# Patient Record
Sex: Male | Born: 1955 | Race: White | Hispanic: No | Smoking: Former smoker
Health system: Southern US, Community
[De-identification: ages and names within clinical notes are randomized; demographics above are authoritative.]

## PROBLEM LIST (undated history)

## (undated) DIAGNOSIS — J449 Chronic obstructive pulmonary disease, unspecified: Secondary | ICD-10-CM

## (undated) DIAGNOSIS — K922 Gastrointestinal hemorrhage, unspecified: Secondary | ICD-10-CM

## (undated) DIAGNOSIS — B1920 Unspecified viral hepatitis C without hepatic coma: Secondary | ICD-10-CM

## (undated) DIAGNOSIS — J189 Pneumonia, unspecified organism: Secondary | ICD-10-CM

## (undated) DIAGNOSIS — F1010 Alcohol abuse, uncomplicated: Secondary | ICD-10-CM

## (undated) DIAGNOSIS — C801 Malignant (primary) neoplasm, unspecified: Secondary | ICD-10-CM

## (undated) DIAGNOSIS — M1990 Unspecified osteoarthritis, unspecified site: Secondary | ICD-10-CM

## (undated) DIAGNOSIS — F1410 Cocaine abuse, uncomplicated: Secondary | ICD-10-CM

## (undated) HISTORY — PX: CARDIAC CATHETERIZATION: SHX172

---

## 2005-08-20 ENCOUNTER — Emergency Department: Payer: Self-pay | Admitting: Emergency Medicine

## 2006-11-12 ENCOUNTER — Ambulatory Visit: Payer: Self-pay | Admitting: Internal Medicine

## 2008-05-02 ENCOUNTER — Ambulatory Visit: Payer: Self-pay | Admitting: Gastroenterology

## 2008-07-26 ENCOUNTER — Emergency Department: Payer: Self-pay | Admitting: Internal Medicine

## 2015-03-12 ENCOUNTER — Emergency Department: Payer: Managed Care, Other (non HMO)

## 2015-03-12 ENCOUNTER — Encounter: Payer: Self-pay | Admitting: Emergency Medicine

## 2015-03-12 ENCOUNTER — Emergency Department
Admission: EM | Admit: 2015-03-12 | Discharge: 2015-03-12 | Disposition: A | Discharge status: Home / Self Care | Payer: Managed Care, Other (non HMO) | Attending: Emergency Medicine | Admitting: Emergency Medicine

## 2015-03-12 DIAGNOSIS — S4991XA Unspecified injury of right shoulder and upper arm, initial encounter: Secondary | ICD-10-CM | POA: Diagnosis present

## 2015-03-12 DIAGNOSIS — Y9289 Other specified places as the place of occurrence of the external cause: Secondary | ICD-10-CM | POA: Insufficient documentation

## 2015-03-12 DIAGNOSIS — S29001A Unspecified injury of muscle and tendon of front wall of thorax, initial encounter: Secondary | ICD-10-CM | POA: Insufficient documentation

## 2015-03-12 DIAGNOSIS — Y998 Other external cause status: Secondary | ICD-10-CM | POA: Diagnosis not present

## 2015-03-12 DIAGNOSIS — X58XXXA Exposure to other specified factors, initial encounter: Secondary | ICD-10-CM | POA: Diagnosis not present

## 2015-03-12 DIAGNOSIS — F17200 Nicotine dependence, unspecified, uncomplicated: Secondary | ICD-10-CM | POA: Diagnosis not present

## 2015-03-12 DIAGNOSIS — Y9389 Activity, other specified: Secondary | ICD-10-CM | POA: Insufficient documentation

## 2015-03-12 DIAGNOSIS — M25511 Pain in right shoulder: Secondary | ICD-10-CM

## 2015-03-12 MED ORDER — LIDOCAINE 5 % EX PTCH
1.0000 | MEDICATED_PATCH | Freq: Two times a day (BID) | CUTANEOUS | Status: AC
Start: 2015-03-12 — End: 2016-03-11

## 2015-03-12 MED ORDER — CYCLOBENZAPRINE HCL 5 MG PO TABS: 5.0000 mg | ORAL_TABLET | Freq: Three times a day (TID) | ORAL | Status: AC | PRN

## 2015-03-12 NOTE — ED Provider Notes (Signed)
[facility] Emergency Department Provider Note    ____________________________________________  Time seen: 1605  I have reviewed the triage vital signs and the nursing notes.   HISTORY  Chief Complaint Shoulder Pain   History limited by: Not Limited   HPI Donald Bowers is a 60 y.o. male with history of smoking he presents to the emergency department today because of concerns for right shoulder and right upper chest pain. He states this happened yesterday when he was pushing a car in the snow. He states that he felt a pop. He immediately then had pain in his right shoulder and upper chest. He states that this pain has been severe. It has been constant. It is sharp. He has had limited range of motion of that shoulder because of the pain.Additionally he has had some associated shortness of breath and a difficult time getting a full breath. He denies any falls or trauma to his shoulder. Denies any fevers.      History reviewed. No pertinent past medical history.  There are no active problems to display for this patient.   History reviewed. No pertinent past surgical history.  No current outpatient prescriptions on file.  Allergies Review of patient's allergies indicates no known allergies.  No family history on file.  Social History Social History  Substance Use Topics  . Smoking status: Current Every Day Smoker  . Smokeless tobacco: None  . Alcohol Use: Yes    Review of Systems  Constitutional: Negative for fever. Cardiovascular: Positive for right upper chest pain Respiratory: Positive for shortness of breath. Gastrointestinal: Negative for abdominal pain, vomiting and diarrhea. Neurological: Negative for headaches, focal weakness or numbness.   10-point ROS otherwise negative.  ____________________________________________   PHYSICAL EXAM:  VITAL SIGNS: ED Triage Vitals  Enc Vitals Group     BP 03/12/15 1508 152/80 mmHg    Pulse Rate 03/12/15 1508 87     Resp 03/12/15 1508 20     Temp 03/12/15 1508 97.5 F (36.4 C)     Temp Source 03/12/15 1508 Oral     SpO2 03/12/15 1508 98 %     Weight 03/12/15 1508 200 lb (90.719 kg)     Height 03/12/15 1508 6' (1.829 m)     Head Cir --      Peak Flow --    Constitutional: Alert and oriented. Well appearing and in no distress. Eyes: Conjunctivae are normal. PERRL. Normal extraocular movements. ENT   Head: Normocephalic and atraumatic.   Nose: No congestion/rhinnorhea.   Mouth/Throat: Mucous membranes are moist.   Neck: No stridor. Hematological/Lymphatic/Immunilogical: No cervical lymphadenopathy. Cardiovascular: Normal rate, regular rhythm.  No murmurs, rubs, or gallops. Respiratory: Normal respiratory effort without tachypnea nor retractions. Breath sounds are clear and equal bilaterally. No wheezes/rales/rhonchi. Gastrointestinal: Soft and nontender. No distention. There is no CVA tenderness. Genitourinary: Deferred Musculoskeletal: No obvious deformity to the right shoulder. Decreased range of motion secondary to pain. Tender to palpation somewhat diffusely however worse over the superior scapula. Neurologic:  Normal speech and language. No gross focal neurologic deficits are appreciated.  Skin:  Skin is warm, dry and intact. No rash noted. Psychiatric: Mood and affect are normal. Speech and behavior are normal. Patient exhibits appropriate insight and judgment.  ____________________________________________    LABS (pertinent positives/negatives)  None  ____________________________________________   EKG  None  ____________________________________________    RADIOLOGY  CXR IMPRESSION: No active disease. Bibasilar scarring/atelectasis.   ____________________________________________   PROCEDURES  Procedure(s) performed: None  Critical Care performed: No  ____________________________________________   INITIAL IMPRESSION /  ASSESSMENT AND PLAN / ED COURSE  Pertinent labs & imaging results that were available during my care of the patient were reviewed by me and considered in my medical decision making (see chart for details).  Patient presented to the emergency department today with right shoulder pain. Chest x-ray without any concerning findings. Think likely patient suffering from rotator cuff injury. Discussed treatment with patient. Will give orthopedic follow-up.  ____________________________________________   FINAL CLINICAL IMPRESSION(S) / ED DIAGNOSES  Final diagnoses:  Right shoulder pain     Nance Pear, MD 03/12/15 1723

## 2015-03-12 NOTE — ED Notes (Signed)
Pt to ed with c/o right shoulder pain that started yesterday after he pushed a car.  Pt reports increased pain with movement of right arm.

## 2015-03-12 NOTE — Discharge Instructions (Signed)
Please seek medical attention for any high fevers, chest pain, shortness of breath, change in behavior, persistent vomiting, bloody stool or any other new or concerning symptoms.   Shoulder Pain The shoulder is the joint that connects your arms to your body. The bones that form the shoulder joint include the upper arm bone (humerus), the shoulder blade (scapula), and the collarbone (clavicle). The top of the humerus is shaped like a ball and fits into a rather flat socket on the scapula (glenoid cavity). A combination of muscles and strong, fibrous tissues that connect muscles to bones (tendons) support your shoulder joint and hold the ball in the socket. Small, fluid-filled sacs (bursae) are located in different areas of the joint. They act as cushions between the bones and the overlying soft tissues and help reduce friction between the gliding tendons and the bone as you move your arm. Your shoulder joint allows a wide range of motion in your arm. This range of motion allows you to do things like scratch your back or throw a ball. However, this range of motion also makes your shoulder more prone to pain from overuse and injury. Causes of shoulder pain can originate from both injury and overuse and usually can be grouped in the following four categories:  Redness, swelling, and pain (inflammation) of the tendon (tendinitis) or the bursae (bursitis).  Instability, such as a dislocation of the joint.  Inflammation of the joint (arthritis).  Broken bone (fracture). HOME CARE INSTRUCTIONS   Apply ice to the sore area.  Put ice in a plastic bag.  Place a towel between your skin and the bag.  Leave the ice on for 15-20 minutes, 3-4 times per day for the first 2 days, or as directed by your health care provider.  Stop using cold packs if they do not help with the pain.  If you have a shoulder sling or immobilizer, wear it as long as your caregiver instructs. Only remove it to shower or bathe.  Move your arm as little as possible, but keep your hand moving to prevent swelling.  Squeeze a soft ball or foam pad as much as possible to help prevent swelling.  Only take over-the-counter or prescription medicines for pain, discomfort, or fever as directed by your caregiver. SEEK MEDICAL CARE IF:   Your shoulder pain increases, or new pain develops in your arm, hand, or fingers.  Your hand or fingers become cold and numb.  Your pain is not relieved with medicines. SEEK IMMEDIATE MEDICAL CARE IF:   Your arm, hand, or fingers are numb or tingling.  Your arm, hand, or fingers are significantly swollen or turn white or blue. MAKE SURE YOU:   Understand these instructions.  Will watch your condition.  Will get help right away if you are not doing well or get worse.   This information is not intended to replace advice given to you by your health care provider. Make sure you discuss any questions you have with your health care provider.   Document Released: 11/27/2004 Document Revised: 03/10/2014 Document Reviewed: 06/12/2014 Elsevier Interactive Patient Education [employer].

## 2015-09-21 ENCOUNTER — Emergency Department
Admission: EM | Admit: 2015-09-21 | Discharge: 2015-09-21 | Disposition: A | Discharge status: Home / Self Care | Payer: Managed Care, Other (non HMO) | Attending: Emergency Medicine | Admitting: Emergency Medicine

## 2015-09-21 ENCOUNTER — Emergency Department: Payer: Managed Care, Other (non HMO)

## 2015-09-21 ENCOUNTER — Encounter: Payer: Self-pay | Admitting: Emergency Medicine

## 2015-09-21 DIAGNOSIS — F17210 Nicotine dependence, cigarettes, uncomplicated: Secondary | ICD-10-CM | POA: Diagnosis not present

## 2015-09-21 DIAGNOSIS — J441 Chronic obstructive pulmonary disease with (acute) exacerbation: Secondary | ICD-10-CM | POA: Insufficient documentation

## 2015-09-21 DIAGNOSIS — L0501 Pilonidal cyst with abscess: Secondary | ICD-10-CM | POA: Diagnosis not present

## 2015-09-21 DIAGNOSIS — R0602 Shortness of breath: Secondary | ICD-10-CM | POA: Diagnosis present

## 2015-09-21 HISTORY — DX: Unspecified viral hepatitis C without hepatic coma: B19.20

## 2015-09-21 HISTORY — DX: Chronic obstructive pulmonary disease, unspecified: J44.9

## 2015-09-21 LAB — TROPONIN I
Troponin I: 0.03 ng/mL (ref <0.03)
Troponin I: <0.03 ng/mL (ref <0.03)

## 2015-09-21 LAB — BASIC METABOLIC PANEL
Anion gap: 6 (ref 5–15)
BUN: 7 mg/dL (ref 6–20)
CHLORIDE: 103 mmol/L (ref 101–111)
CO2: 21 mmol/L — AB (ref 22–32)
CREATININE: 0.64 mg/dL (ref 0.61–1.24)
Calcium: 8.2 mg/dL — ABNORMAL LOW (ref 8.9–10.3)
GFR calc Af Amer: >60 mL/min (ref >60)
GFR calc non Af Amer: >60 mL/min (ref >60)
GLUCOSE: 255 mg/dL — AB (ref 65–99)
POTASSIUM: 4.1 mmol/L (ref 3.5–5.1)
Sodium: 130 mmol/L — ABNORMAL LOW (ref 135–145)

## 2015-09-21 LAB — CBC
HEMATOCRIT: 38.9 % — AB (ref 40.0–52.0)
Hemoglobin: 13.6 g/dL (ref 13.0–18.0)
MCH: 32.8 pg (ref 26.0–34.0)
MCHC: 34.9 g/dL (ref 32.0–36.0)
MCV: 94 fL (ref 80.0–100.0)
PLATELETS: 111 10*3/uL — AB (ref 150–440)
RBC: 4.13 MIL/uL — ABNORMAL LOW (ref 4.40–5.90)
RDW: 14.6 % — AB (ref 11.5–14.5)
WBC: 8 10*3/uL (ref 3.8–10.6)

## 2015-09-21 LAB — BRAIN NATRIURETIC PEPTIDE: B Natriuretic Peptide: 122 pg/mL — ABNORMAL HIGH (ref 0.0–100.0)

## 2015-09-21 MED ORDER — DOXYCYCLINE HYCLATE 100 MG PO CAPS: 100.0000 mg | ORAL_CAPSULE | Freq: Two times a day (BID) | ORAL | Status: AC

## 2015-09-21 MED ORDER — ALBUTEROL SULFATE (2.5 MG/3ML) 0.083% IN NEBU
5.0000 mg | INHALATION_SOLUTION | Freq: Once | RESPIRATORY_TRACT | Status: AC
  Administered 2015-09-21: 5 mg via RESPIRATORY_TRACT
  Filled 2015-09-21: qty 6

## 2015-09-21 MED ORDER — IPRATROPIUM BROMIDE 0.02 % IN SOLN
0.5000 mg | Freq: Once | RESPIRATORY_TRACT | Status: AC
  Administered 2015-09-21: 0.5 mg via RESPIRATORY_TRACT
  Filled 2015-09-21: qty 2.5

## 2015-09-21 MED ORDER — LIDOCAINE-EPINEPHRINE (PF) 1 %-1:200000 IJ SOLN
INTRAMUSCULAR | Status: AC
  Administered 2015-09-21: 20:00:00
  Filled 2015-09-21: qty 30

## 2015-09-21 MED ORDER — LIDOCAINE-EPINEPHRINE 2 %-1:100000 IJ SOLN
20.0000 mL | Freq: Once | INTRAMUSCULAR | Status: DC
  Filled 2015-09-21: qty 20

## 2015-09-21 MED ORDER — PREDNISONE 20 MG PO TABS: 60.0000 mg | ORAL_TABLET | Freq: Every day | ORAL | Status: DC

## 2015-09-21 MED ORDER — PREDNISONE 20 MG PO TABS
60.0000 mg | ORAL_TABLET | Freq: Once | ORAL | Status: AC
  Administered 2015-09-21: 60 mg via ORAL
  Filled 2015-09-21: qty 3

## 2015-09-21 MED ORDER — ALBUTEROL SULFATE HFA 108 (90 BASE) MCG/ACT IN AERS: 2.0000 | INHALATION_SPRAY | Freq: Four times a day (QID) | RESPIRATORY_TRACT | Status: DC | PRN

## 2015-09-21 MED ORDER — DOXYCYCLINE HYCLATE 100 MG PO TABS
100.0000 mg | ORAL_TABLET | Freq: Once | ORAL | Status: AC
  Administered 2015-09-21: 100 mg via ORAL
  Filled 2015-09-21: qty 1

## 2015-09-21 NOTE — ED Provider Notes (Signed)
[facility] Emergency Department Provider Note  ____________________________________________  Time seen: Approximately 4:47 PM  I have reviewed the triage vital signs and the nursing notes.   HISTORY  Chief Complaint Shortness of Breath; Back Pain; and Headache   HPI Donald Bowers is a 60 y.o. male with h/o hepatitis C, polysubstance abuse, COPD, active smoker who presents for evaluation of pilonidal abscess and SOB. Patient reports 3 days of worsening chronic cough, productive of white thick sputum, worsening shortness of breath and wheezing. Patient is a current smoker. He reports he hasn't seen a document used. Does not use any medications at home for his COPD. Also endorses chills, nausea, body aches for 3 days. Patient reports mild shortness of breath. He denies orthopnea, chest pain, fever, abdominal pain, nausea, vomiting. Patient is also complaining of recurring patent without abscess. He had it drained in the past. He was referred to a surgeon but never follow-up. He reports this one has been going on for a few days.  Past Medical History  Diagnosis Date  . COPD (chronic obstructive pulmonary disease) ([place])   . Hepatitis C     There are no active problems to display for this patient.   Past Surgical History  Procedure Laterality Date  . Cardiac catheterization      Current Outpatient Rx  Name  Route  Sig  Dispense  Refill  . albuterol (PROVENTIL HFA;VENTOLIN HFA) 108 (90 Base) MCG/ACT inhaler   Inhalation   Inhale 2 puffs into the lungs every 6 (six) hours as needed for wheezing or shortness of breath.   1 Inhaler   2   . cyclobenzaprine (FLEXERIL) 5 MG tablet   Oral   Take 1 tablet (5 mg total) by mouth every 8 (eight) hours as needed for muscle spasms.   20 tablet   0   . doxycycline (VIBRAMYCIN) 100 MG capsule   Oral   Take 1 capsule (100 mg total) by mouth 2 (two) times daily.   14 capsule   0   . lidocaine (LIDODERM) 5  %   Transdermal   Place 1 patch onto the skin every 12 (twelve) hours. Remove & Discard patch within 12 hours or as directed by MD   10 patch   0   . predniSONE (DELTASONE) 20 MG tablet   Oral   Take 3 tablets (60 mg total) by mouth daily.   12 tablet   0     Allergies Review of patient's allergies indicates no known allergies.  No family history on file.  Social History Social History  Substance Use Topics  . Smoking status: Current Every Day Smoker -- 0.50 packs/day    Types: Cigarettes  . Smokeless tobacco: None  . Alcohol Use: Yes     Comment: weekly/daily    Review of Systems  Constitutional: Negative for fever. Eyes: Negative for visual changes. ENT: Negative for sore throat. Cardiovascular: Negative for chest pain. Respiratory: + shortness of breath, productive cough, and wheezing  Gastrointestinal: Negative for abdominal pain, vomiting or diarrhea. Genitourinary: Negative for dysuria. Musculoskeletal: Negative for back pain. Skin: Negative for rash. + pilonidal abscess Neurological: Negative for headaches, weakness or numbness.  ____________________________________________   PHYSICAL EXAM:  VITAL SIGNS: ED Triage Vitals  Enc Vitals Group     BP 09/21/15 1418 132/70 mmHg     Pulse Rate 09/21/15 1418 95     Resp 09/21/15 1418 18     Temp 09/21/15 1418 99.4 F (37.4  C)     Temp Source 09/21/15 1418 Oral     SpO2 09/21/15 1418 97 %     Weight 09/21/15 1418 205 lb (92.987 kg)     Height 09/21/15 1418 6' (1.829 m)     Head Cir --      Peak Flow --      Pain Score 09/21/15 1419 8     Pain Loc --      Pain Edu? --      Excl. in [place]? --     Constitutional: Alert and oriented. Well appearing and in no apparent distress. HEENT:      Head: Normocephalic and atraumatic.         Eyes: Conjunctivae are normal. Sclera is icteric. EOMI. PERRL      Mouth/Throat: Mucous membranes are moist.       Neck: Supple with no signs of meningismus. Cardiovascular:  Regular rate and rhythm. No murmurs, gallops, or rubs. 2+ symmetrical distal pulses are present in all extremities. No JVD. Respiratory: Mild increased WOB and audible wheezing in the room, normal sats, lungs with faint expiratory wheezes. Gastrointestinal: Soft, non tender, and non distended with positive bowel sounds. No rebound or guarding. Genitourinary: No CVA tenderness. Musculoskeletal: Nontender with normal range of motion in all extremities. No edema, cyanosis, or erythema of extremities. Neurologic: Normal speech and language. Face is symmetric. Moving all extremities. No gross focal neurologic deficits are appreciated. Skin: Skin is warm, dry and intact. No rash noted. Pilonidal abscess with overlying erythema. Psychiatric: Mood and affect are normal. Speech and behavior are normal.  ____________________________________________   LABS (all labs ordered are listed, but only abnormal results are displayed)  Labs Reviewed  BASIC METABOLIC PANEL - Abnormal; Notable for the following:    Sodium 130 (*)    CO2 21 (*)    Glucose, Bld 255 (*)    Calcium 8.2 (*)    All other components within normal limits  CBC - Abnormal; Notable for the following:    RBC 4.13 (*)    HCT 38.9 (*)    RDW 14.6 (*)    Platelets 111 (*)    All other components within normal limits  BRAIN NATRIURETIC PEPTIDE - Abnormal; Notable for the following:    B Natriuretic Peptide 122.0 (*)    All other components within normal limits  TROPONIN I - Abnormal; Notable for the following:    Troponin I 0.03 (*)    All other components within normal limits  TROPONIN I   ____________________________________________  EKG  ED ECG REPORT I, Rudene Re, the attending physician, personally viewed and interpreted this ECG.  Neurosensory, rate of 93, normal intervals, normal axis, no ST elevations or depressions. T-wave inversions in lead III. No prior for  comparison ____________________________________________  RADIOLOGY  CXR: negative  ____________________________________________   PROCEDURES  Procedure(s) performed:yes  INCISION AND DRAINAGE Performed by: Rudene Re Consent: Verbal consent obtained. Risks and benefits: risks, benefits and alternatives were discussed Type: abscess  Body area: Pilonidal  Anesthesia: local infiltration  Incision was made with a scalpel.  Local anesthetic: lidocaine 1% w/ epinephrine  Anesthetic total: 3 ml  Complexity: complex Blunt dissection to break up loculations  Drainage: purulent  Drainage amount: moderate  Packing material: no packing  Patient tolerance: Patient tolerated the procedure well with no immediate complications.    Critical Care performed:  None ____________________________________________   INITIAL IMPRESSION / ASSESSMENT AND PLAN / ED COURSE  60 y.o. male with h/o  hepatitis C, polysubstance abuse, COPD, active smoker who presents for evaluation of pilonidal abscess and mild COPD exacerbation in the setting of 3 days of URI and worsening cough. Plan for duoneb, prednisone, labs, EKG, CXR. Will starts patient on doxycycline For COPD exacerbation and also overlying cellulitis of his pilonidal abscess. We'll drain the abscess.   _________________________ 8:05 PM on 09/21/2015 -----------------------------------------  Patient's breathing status markedly improved after 2 breathing treatments. Pilonidal abscess was drained per procedure above. Patient with discharge home on doxycycline for both COPD exacerbation and cellulitis. Patient was referred to the [facility] to clinic for follow-up. Patient will be given albuterol rescue inhaler, and prednisone for 4 days.  Pertinent labs & imaging results that were available during my care of the patient were reviewed by me and considered in my medical decision making (see chart for  details).    ____________________________________________   FINAL CLINICAL IMPRESSION(S) / ED DIAGNOSES  Final diagnoses:  COPD exacerbation ([place])  Pilonidal cyst with abscess      NEW MEDICATIONS STARTED DURING THIS VISIT:  New Prescriptions   ALBUTEROL (PROVENTIL HFA;VENTOLIN HFA) 108 (90 BASE) MCG/ACT INHALER    Inhale 2 puffs into the lungs every 6 (six) hours as needed for wheezing or shortness of breath.   DOXYCYCLINE (VIBRAMYCIN) 100 MG CAPSULE    Take 1 capsule (100 mg total) by mouth 2 (two) times daily.   PREDNISONE (DELTASONE) 20 MG TABLET    Take 3 tablets (60 mg total) by mouth daily.     Note:  This document was prepared using Dragon voice recognition software and may include unintentional dictation errors.    Rudene Re, MD 09/21/15 2006

## 2015-09-21 NOTE — ED Notes (Signed)
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  Patient states area to his lower back has become swollen, painful, and has been draining fluid.  Patient reports headache x 3 weeks.  Patient also states, "it feels like I've got fluid on my lungs."  Patient reports shortness of breath x 5 years intermittently.  Patient reports history of COPD, denies history of CHF.  Patient has dyspnea with rest and on exertion.  Speaking in full sentences without obvious distress.  Patient states, "sometimes my feet turn blue."

## 2015-09-21 NOTE — ED Notes (Signed)
Pt. Left without one prescription. Pt. Contacted and stated he would return tonight or in the morning. Pt. Aware prescription left with first nurse and first nurse aware of plan.

## 2015-09-21 NOTE — Discharge Instructions (Signed)
You have been seen in the Emergency Department (ED) today for an abscess. A skin abscess is a bacterial infection that forms a pocket of pus. A boil is a kind of skin abscess. This was drained in the ED. If you were prescribed antibiotics, please make sure to take it fully as prescribed.  Follow-up with your doctor in 24-48 hours for a re-check. If you do not have a doctor you may return to the ER for a re-check.  How can you care for yourself at home?  Apply warm and dry compresses, a heating pad set on low, or a hot water bottle 3 or 4 times a day for pain. Keep a cloth between the heat source and your skin.  If your doctor prescribed antibiotics, take them as directed. Do not stop taking them just because you feel better. You need to take the full course of antibiotics.  Take pain medicines exactly as directed.  If the doctor gave you a prescription medicine for pain, take it as prescribed.  If you are not taking a prescription pain medicine, ask your doctor if you can take an over-the-counter medicine. Keep your bandage clean and dry. Change the bandage whenever it gets wet or dirty, or at least one time a day.  If the abscess was packed with gauze:  Keep follow-up appointments to have the gauze changed or removed. If the doctor instructed you to remove the gauze, gently pull out all of the gauze when your doctor tells you to.  After the gauze is removed, soak the area in warm water for 15 to 20 minutes 2 times a day, until the wound closes.  When should you call for help?  Call your doctor now or seek immediate medical care if:  You have signs of worsening infection, such as:  Increased pain, swelling, warmth, or redness.  Red streaks leading from the infected skin.  Pus draining from the wound.  A fever. Uncontrolled nausea and vomiting Watch closely for changes in your health, and be sure to contact your doctor if:  You do not get better as expected.   When should you call for help?   Call your doctor now or seek immediate medical care if:  You have signs of worsening infection, such as:  Increased pain, swelling, warmth, or redness.  Red streaks leading from the infected skin.  Pus draining from the wound.  A fever. Uncontrolled nausea and vomiting Watch closely for changes in your health, and be sure to contact your doctor if:  You do not get better as expected   You were seen today in the Emergency Department (ED) and was diagnosed with a COPD exacerbation. Chronic obstructive pulmonary disease (COPD) is a general term for a group of lung diseases, including emphysema and chronic bronchitis. People with COPD have decreased airflow in and out of the lungs, which makes it hard to breathe. The airways also can get clogged with thick mucus. Cigarette smoking is a major cause of COPD.   Although there is no cure for COPD, you can slow its progress. Following your treatment plan and taking care of yourself can help you feel better and live longer.   Use your albuterol inhaler 2 puffs every 4 hour as needed for shortness of breath, wheezing, or cough. Take steroids as prescribed. Take antibiotics as prescribed.  Follow-up with your doctor in 1 day for re-evaluation.   When should you call for help?  Call 911 anytime you think you may  need emergency care. For example, call if:  You have severe trouble breathing.  You have severe chest pain. Call your doctor now or seek immediate medical care if:  You have new or worse shortness of breath.  You develop new chest pain.  You are coughing more deeply or more often, especially if you notice more mucus or a change in the color of your mucus.  You cough up blood.  You have new or increased swelling in your legs or belly.  You have a fever. Watch closely for changes in your health, and be sure to contact your doctor if:  You use your antibiotic prescription.  Your symptoms are getting worse   How can you care for yourself  at home?   During an exacerbation  Do not panic if you start to have one. Quick treatment at home may help you prevent serious breathing problems. If you have a COPD exacerbation plan that you developed with your doctor, follow it.  Take your medicines exactly as your doctor tells you.  Use your inhaler as directed by your doctor. If your symptoms do not get better after you use your medicine, have someone take you to the emergency room. Call an ambulance if necessary.  With inhaled medicines, a spacer or a nebulizer may help you get more medicine to your lungs. Ask your doctor or pharmacist how to use them properly. Practice using the spacer in front of a mirror before you have an exacerbation. This may help you get the medicine into your lungs quickly.  If your doctor has given you steroid pills, take them as directed.  Your doctor may have given you a prescription for antibiotics, which you are to fill if you need it. Call your doctor if you use the prescription.  Talk to your doctor if you have any problems with your medicine.  Preventing an exacerbation  Do not smoke. This is the most important step you can take to prevent more damage to your lungs and prevent problems. If you already smoke, it is never too late to stop. If you need help quitting, talk to your doctor about stop-smoking programs and medicines. These can increase your chances of quitting for good.  Take your daily medicines as prescribed.  Avoid colds and flu.  Get a pneumococcal vaccine.  Get a flu vaccine each year, as soon as it is available. Ask those you live or work with to do the same, so they will not get the flu and infect you.  Try to stay away from people with colds or the flu.  Wash your hands often. Avoid secondhand smoke; air pollution; cold, dry air; hot, humid air; and high altitudes. Stay at home with your windows closed when air pollution is bad.  Learn breathing techniques for COPD, such as breathing through  pursed lips. These techniques can help you breathe easier during an exacerbation.  Staying healthy  Do not smoke. This is the most important step you can take to prevent more damage to your lungs. If you need help quitting, talk to your doctor about stop-smoking programs and medicines. These can increase your chances of quitting for good.  Avoid colds and flu. Get a pneumococcal vaccine shot. If you have had one before, ask your doctor whether you need a second dose. Get the flu vaccine every fall. If you must be around people with colds or the flu, wash your hands often.  Avoid secondhand smoke, air pollution, and high altitudes. Also  avoid cold, dry air and hot, humid air. Stay at home with your windows closed when air pollution is bad.  Medicines and oxygen therapy  Take your medicines exactly as prescribed. Call your doctor if you think you are having a problem with your medicine.  You may be taking medicines such as:  Bronchodilators. These help open your airways and make breathing easier. Bronchodilators are either short-acting (work for 6 to 9 hours) or long-acting (work for 24 hours). You inhale most bronchodilators, so they start to act quickly. Always carry your quick-relief inhaler with you in case you need it while you are away from home.  Corticosteroids (prednisone, budesonide). These reduce airway inflammation. They come in pill or inhaled form. You must take these medicines every day for them to work well. A spacer may help you get more inhaled medicine to your lungs. Ask your doctor or pharmacist if a spacer is right for you. If it is, ask how to use it properly.  Do not take any vitamins, over-the-counter medicine, or herbal products without talking to your doctor first.  If your doctor prescribed antibiotics, take them as directed. Do not stop taking them just because you feel better. You need to take the full course of antibiotics.  Oxygen therapy boosts the amount of oxygen in  your blood and helps you breathe easier. Use the flow rate your doctor has recommended, and do not change it without talking to your doctor first.  Activity  Get regular exercise. Walking is an easy way to get exercise. Start out slowly, and walk a little more each day.  Pay attention to your breathing. You are exercising too hard if you cannot talk while you are exercising.  Take short rest breaks when doing household chores and other activities.  Learn breathing methods--such as breathing through pursed lips--to help you become less short of breath.  If your doctor has not set you up with a pulmonary rehabilitation program, talk to him or her about whether rehab is right for you. Rehab includes exercise programs, education about your disease and how to manage it, help with diet and other changes, and emotional support.  Diet  Eat regular, healthy meals. Use bronchodilators about 1 hour before you eat to make it easier to eat. Eat several small meals instead of three large ones. Drink beverages at the end of the meal. Avoid foods that are hard to chew.  Eat foods that contain fat and protein so that you do not lose weight and muscle mass. These foods include ice cream, pudding, cheese, eggs, and peanut butter.  Use less salt. Too much salt can cause you to retain fluids, which makes it harder to breathe. Do not add salt while you are cooking or at the table. Eat fewer processed foods and foods from restaurants, including fast foods. Use fresh or frozen foods instead of canned foods.  Mental health  Talk to your family, friends, or a therapist about your feelings. It is normal to feel frightened, angry, hopeless, helpless, and even guilty. Talking openly about bad feelings can help you cope. If these feelings last, talk to your doctor.

## 2015-09-21 NOTE — ED Notes (Signed)
Pt. Verbalizes understanding of d/c instructions, prescriptions, and follow-up. VS stable.  Pt. In NAD at time of d/c and denies concerns. Pt. Ambulatory Out of the unit with steady gait.   

## 2015-10-23 ENCOUNTER — Emergency Department
Admission: EM | Admit: 2015-10-23 | Discharge: 2015-10-23 | Disposition: A | Discharge status: Home / Self Care | Payer: Managed Care, Other (non HMO) | Attending: Student in an Organized Health Care Education/Training Program | Admitting: Student in an Organized Health Care Education/Training Program

## 2015-10-23 ENCOUNTER — Encounter: Payer: Self-pay | Admitting: Emergency Medicine

## 2015-10-23 ENCOUNTER — Emergency Department: Payer: Managed Care, Other (non HMO)

## 2015-10-23 DIAGNOSIS — M5432 Sciatica, left side: Secondary | ICD-10-CM | POA: Insufficient documentation

## 2015-10-23 DIAGNOSIS — F17210 Nicotine dependence, cigarettes, uncomplicated: Secondary | ICD-10-CM | POA: Diagnosis not present

## 2015-10-23 DIAGNOSIS — N50812 Left testicular pain: Secondary | ICD-10-CM | POA: Diagnosis present

## 2015-10-23 DIAGNOSIS — R52 Pain, unspecified: Secondary | ICD-10-CM

## 2015-10-23 DIAGNOSIS — J449 Chronic obstructive pulmonary disease, unspecified: Secondary | ICD-10-CM | POA: Insufficient documentation

## 2015-10-23 DIAGNOSIS — N451 Epididymitis: Secondary | ICD-10-CM | POA: Insufficient documentation

## 2015-10-23 LAB — CBC WITH DIFFERENTIAL/PLATELET
BASOS ABS: 0.1 10*3/uL (ref 0–0.1)
Basophils Relative: 1 %
EOS ABS: 0.1 10*3/uL (ref 0–0.7)
EOS PCT: 2 %
HCT: 39.9 % — ABNORMAL LOW (ref 40.0–52.0)
Hemoglobin: 14 g/dL (ref 13.0–18.0)
LYMPHS PCT: 9 %
Lymphs Abs: 0.5 10*3/uL — ABNORMAL LOW (ref 1.0–3.6)
MCH: 32.7 pg (ref 26.0–34.0)
MCHC: 35.2 g/dL (ref 32.0–36.0)
MCV: 93.1 fL (ref 80.0–100.0)
MONO ABS: 0.6 10*3/uL (ref 0.2–1.0)
Monocytes Relative: 11 %
Neutro Abs: 4.5 10*3/uL (ref 1.4–6.5)
Neutrophils Relative %: 77 %
PLATELETS: 156 10*3/uL (ref 150–440)
RBC: 4.28 MIL/uL — ABNORMAL LOW (ref 4.40–5.90)
RDW: 13.6 % (ref 11.5–14.5)
WBC: 5.8 10*3/uL (ref 3.8–10.6)

## 2015-10-23 LAB — COMPREHENSIVE METABOLIC PANEL
ALT: 24 U/L (ref 17–63)
AST: 50 U/L — AB (ref 15–41)
Albumin: 2.7 g/dL — ABNORMAL LOW (ref 3.5–5.0)
Alkaline Phosphatase: 83 U/L (ref 38–126)
Anion gap: 5 (ref 5–15)
BUN: 5 mg/dL — ABNORMAL LOW (ref 6–20)
CHLORIDE: 104 mmol/L (ref 101–111)
CO2: 26 mmol/L (ref 22–32)
Calcium: 8.1 mg/dL — ABNORMAL LOW (ref 8.9–10.3)
Creatinine, Ser: 0.64 mg/dL (ref 0.61–1.24)
Glucose, Bld: 100 mg/dL — ABNORMAL HIGH (ref 65–99)
POTASSIUM: 3.8 mmol/L (ref 3.5–5.1)
SODIUM: 135 mmol/L (ref 135–145)
Total Bilirubin: 2 mg/dL — ABNORMAL HIGH (ref 0.3–1.2)
Total Protein: 8 g/dL (ref 6.5–8.1)

## 2015-10-23 LAB — URINALYSIS COMPLETE WITH MICROSCOPIC (ARMC ONLY)
Bilirubin Urine: NEGATIVE
Ketones, ur: NEGATIVE mg/dL
NITRITE: NEGATIVE
PH: 6 (ref 5.0–8.0)
PROTEIN: NEGATIVE mg/dL
SPECIFIC GRAVITY, URINE: 1.016 (ref 1.005–1.030)

## 2015-10-23 LAB — PSA: PSA: 0.39 ng/mL (ref 0.00–4.00)

## 2015-10-23 MED ORDER — HYDROMORPHONE HCL 1 MG/ML IJ SOLN
1.0000 mg | Freq: Once | INTRAMUSCULAR | Status: AC
  Administered 2015-10-23: 1 mg via INTRAMUSCULAR
  Filled 2015-10-23: qty 1

## 2015-10-23 MED ORDER — OXYCODONE-ACETAMINOPHEN 7.5-325 MG PO TABS: 1.0000 | ORAL_TABLET | ORAL | 0 refills | Status: DC | PRN

## 2015-10-23 MED ORDER — KETOROLAC TROMETHAMINE 60 MG/2ML IM SOLN
60.0000 mg | Freq: Once | INTRAMUSCULAR | Status: AC
  Administered 2015-10-23: 60 mg via INTRAMUSCULAR
  Filled 2015-10-23: qty 2

## 2015-10-23 MED ORDER — DOXYCYCLINE MONOHYDRATE 100 MG PO CAPS
100.0000 mg | ORAL_CAPSULE | Freq: Two times a day (BID) | ORAL | 0 refills | Status: DC
Start: 2015-10-23 — End: 2016-05-22

## 2015-10-23 NOTE — ED Notes (Signed)
See triage note  States he developed left lower back pain which radiates into left leg about 3 days   Denies any specific injury.also states he is having pain to left testicle area

## 2015-10-23 NOTE — ED Provider Notes (Signed)
[facility] Emergency Department Provider Note   ____________________________________________   None    (approximate)  I have reviewed the triage vital signs and the nursing notes.   HISTORY  Chief Complaint Leg Pain    HPI Donald Bowers is a 60 y.o. male patient complaining of left inguinal pain that radiates down his left leg. Patient state onset 3 days ago. Patient state last 24 hours. Got increasingly worse. Patient also complaining of increased hesitancy with voiding. Patient stated this has increased over the last 6 months. Patient also complaining of edema and pain to the left testicle. Patient states he is scheduled to see his PCP at [facility] clinic tomorrow morning but came is secondary to the increased pain. Patient stated pain increases with ambulation.Patient is rating his pain as a 10 over 10. No palliative measures taken for this complaint. Patient described a pain as sharp and intermittent crampiness.   Past Medical History:  Diagnosis Date  . COPD (chronic obstructive pulmonary disease) ([place])   . Hepatitis C     There are no active problems to display for this patient.   Past Surgical History:  Procedure Laterality Date  . CARDIAC CATHETERIZATION      Prior to Admission medications   Medication Sig Start Date End Date Taking? Authorizing Provider  albuterol (PROVENTIL HFA;VENTOLIN HFA) 108 (90 Base) MCG/ACT inhaler Inhale 2 puffs into the lungs every 6 (six) hours as needed for wheezing or shortness of breath. 09/21/15   Rudene Re, MD  cyclobenzaprine (FLEXERIL) 5 MG tablet Take 1 tablet (5 mg total) by mouth every 8 (eight) hours as needed for muscle spasms. 03/12/15 03/11/16  Nance Pear, MD  doxycycline (MONODOX) 100 MG capsule Take 1 capsule (100 mg total) by mouth 2 (two) times daily. 10/23/15   Sable Feil, PA-C  lidocaine (LIDODERM) 5 % Place 1 patch onto the skin every 12 (twelve) hours. Remove & Discard  patch within 12 hours or as directed by MD 03/12/15 03/11/16  Nance Pear, MD  oxyCODONE-acetaminophen (PERCOCET) 7.5-325 MG tablet Take 1 tablet by mouth every 4 (four) hours as needed for severe pain. 10/23/15 10/22/16  Sable Feil, PA-C  predniSONE (DELTASONE) 20 MG tablet Take 3 tablets (60 mg total) by mouth daily. 09/22/15 09/24/16  Rudene Re, MD    Allergies Review of patient's allergies indicates no known allergies.  No family history on file.  Social History Social History  Substance Use Topics  . Smoking status: Current Every Day Smoker    Packs/day: 0.50    Types: Cigarettes  . Smokeless tobacco: Not on file  . Alcohol use Yes     Comment: weekly/daily    Review of Systems Constitutional: No fever/chills Eyes: No visual changes. ENT: No sore throat. Cardiovascular: Denies chest pain. Respiratory: Denies shortness of breath. Gastrointestinal: No abdominal pain.  No nausea, no vomiting.  No diarrhea.  No constipation. Genitourinary: Negative for dysuria.Left testicle pain Musculoskeletal: Radicular left back pain. Skin: Negative for rash. Neurological: Negative for headaches, focal weakness or numbness.    ____________________________________________   PHYSICAL EXAM:  VITAL SIGNS: ED Triage Vitals [10/23/15 1416]  Enc Vitals Group     BP 132/82     Pulse Rate 89     Resp 16     Temp 98.1 F (36.7 C)     Temp Source Oral     SpO2 100 %     Weight 205 lb (93 kg)     Height  6' (1.829 m)     Head Circumference      Peak Flow      Pain Score 10     Pain Loc      Pain Edu?      Excl. in [place]?     Constitutional: Alert and oriented.Moderate distress Eyes: Conjunctivae are normal. PERRL. EOMI. Head: Atraumatic. Nose: No congestion/rhinnorhea. Mouth/Throat: Mucous membranes are moist.  Oropharynx non-erythematous. Neck: No stridor.  No cervical spine tenderness to palpation. Hematological/Lymphatic/Immunilogical: No cervical  lymphadenopathy. Cardiovascular: Normal rate, regular rhythm. Grossly normal heart sounds.  Good peripheral circulation. Respiratory: Normal respiratory effort.  No retractions. Lungs CTAB. Gastrointestinal: Soft and nontender. No distention. No abdominal bruits. No CVA tenderness. Genitourinary: No obvious scrotum edema or erythema. Tender palpation superior aspect of the left testicle. Musculoskeletal: No obvious spinal deformity. No CVA guarding. Patient has some moderate guarding palpation of L2-S1. Patient has a positive straight leg test is 70 with the left lower extremity. Patient ambulates with patient. The left lower extremity.  Neurologic:  Normal speech and language. No gross focal neurologic deficits are appreciated. No gait instability. Skin:  Skin is warm, dry and intact. No rash noted. Psychiatric: Mood and affect are normal. Speech and behavior are normal.  ____________________________________________   LABS (all labs ordered are listed, but only abnormal results are displayed)  Labs Reviewed  URINALYSIS COMPLETEWITH MICROSCOPIC ([place]) - Abnormal; Notable for the following:       Result Value   Color, Urine AMBER (*)    APPearance HAZY (*)    Glucose, UA >500 (*)    Hgb urine dipstick 1+ (*)    Leukocytes, UA 3+ (*)    Bacteria, UA RARE (*)    Squamous Epithelial / LPF 0-5 (*)    All other components within normal limits  CBC WITH DIFFERENTIAL/PLATELET - Abnormal; Notable for the following:    RBC 4.28 (*)    HCT 39.9 (*)    Lymphs Abs 0.5 (*)    All other components within normal limits  COMPREHENSIVE METABOLIC PANEL - Abnormal; Notable for the following:    Glucose, Bld 100 (*)    BUN 5 (*)    Calcium 8.1 (*)    Albumin 2.7 (*)    AST 50 (*)    Total Bilirubin 2.0 (*)    All other components within normal limits  PSA   ____________________________________________  EKG   ____________________________________________  RADIOLOGY  Ultrasound of  the scrotum revealed a sinus complicated left hydrocele internal echoes and loculations consistent with epididymitis. ____________________________________________   PROCEDURES  Procedure(s) performed: None  Procedures  Critical Care performed: No  ____________________________________________   INITIAL IMPRESSION / ASSESSMENT AND PLAN / ED COURSE  Pertinent labs & imaging results that were available during my care of the patient were reviewed by me and considered in my medical decision making (see chart for details).  Radicular back pain in the left lower extremity. Epididymitis left testes. Patient advised to follow-up with scheduled appointment with new PCP tomorrow at 3 PM. Patient started on doxycycline and Percocets.  Clinical Course  Pertinent to the patient complaints Doppler arches sounds of the scrotum was indicated. Patient given pain relief with Dilaudid and Toradol. Patient's lab revealed elevated white blood count in his urine. Patient also had glucose in his urine.   ____________________________________________   FINAL CLINICAL IMPRESSION(S) / ED DIAGNOSES  Final diagnoses:  Pain  Left testicular pain  Sciatica of left side  Epididymitis  NEW MEDICATIONS STARTED DURING THIS VISIT:  New Prescriptions   DOXYCYCLINE (MONODOX) 100 MG CAPSULE    Take 1 capsule (100 mg total) by mouth 2 (two) times daily.   OXYCODONE-ACETAMINOPHEN (PERCOCET) 7.5-325 MG TABLET    Take 1 tablet by mouth every 4 (four) hours as needed for severe pain.     Note:  This document was prepared using Dragon voice recognition software and may include unintentional dictation errors.    Sable Feil, PA-C 10/23/15 [place], MD 10/23/15 2003

## 2015-10-23 NOTE — ED Triage Notes (Signed)
Pt reports left hip pain that radiates down left leg; reports pain started 3 days ago and has gotten increasingly worse.

## 2015-11-23 ENCOUNTER — Other Ambulatory Visit: Payer: Self-pay | Admitting: Orthopedic Surgery

## 2015-11-23 DIAGNOSIS — M5136 Other intervertebral disc degeneration, lumbar region: Secondary | ICD-10-CM

## 2015-11-23 DIAGNOSIS — M5442 Lumbago with sciatica, left side: Secondary | ICD-10-CM

## 2015-12-05 ENCOUNTER — Ambulatory Visit
Admission: RE | Admit: 2015-12-05 | Discharge: 2015-12-05 | Disposition: A | Discharge status: Home / Self Care | Payer: Managed Care, Other (non HMO) | Source: Ambulatory Visit | Attending: Orthopedic Surgery | Admitting: Orthopedic Surgery

## 2015-12-05 DIAGNOSIS — M48061 Spinal stenosis, lumbar region without neurogenic claudication: Secondary | ICD-10-CM | POA: Diagnosis not present

## 2015-12-05 DIAGNOSIS — M47897 Other spondylosis, lumbosacral region: Secondary | ICD-10-CM | POA: Diagnosis not present

## 2015-12-05 DIAGNOSIS — M5126 Other intervertebral disc displacement, lumbar region: Secondary | ICD-10-CM | POA: Insufficient documentation

## 2015-12-05 DIAGNOSIS — M5442 Lumbago with sciatica, left side: Secondary | ICD-10-CM

## 2015-12-05 DIAGNOSIS — M5136 Other intervertebral disc degeneration, lumbar region: Secondary | ICD-10-CM

## 2016-05-19 ENCOUNTER — Emergency Department: Payer: 59

## 2016-05-19 ENCOUNTER — Encounter: Payer: Self-pay | Admitting: *Deleted

## 2016-05-19 ENCOUNTER — Inpatient Hospital Stay
Admission: EM | Admit: 2016-05-19 | Discharge: 2016-05-22 | DRG: 871 | Disposition: A | Discharge status: Home Health | Payer: 59 | Attending: Internal Medicine | Admitting: Internal Medicine

## 2016-05-19 DIAGNOSIS — N390 Urinary tract infection, site not specified: Secondary | ICD-10-CM | POA: Diagnosis present

## 2016-05-19 DIAGNOSIS — F17210 Nicotine dependence, cigarettes, uncomplicated: Secondary | ICD-10-CM | POA: Diagnosis present

## 2016-05-19 DIAGNOSIS — D696 Thrombocytopenia, unspecified: Secondary | ICD-10-CM | POA: Diagnosis present

## 2016-05-19 DIAGNOSIS — R109 Unspecified abdominal pain: Secondary | ICD-10-CM

## 2016-05-19 DIAGNOSIS — E860 Dehydration: Secondary | ICD-10-CM | POA: Diagnosis present

## 2016-05-19 DIAGNOSIS — Z79899 Other long term (current) drug therapy: Secondary | ICD-10-CM

## 2016-05-19 DIAGNOSIS — R1110 Vomiting, unspecified: Secondary | ICD-10-CM

## 2016-05-19 DIAGNOSIS — J440 Chronic obstructive pulmonary disease with acute lower respiratory infection: Secondary | ICD-10-CM | POA: Diagnosis present

## 2016-05-19 DIAGNOSIS — A4151 Sepsis due to Escherichia coli [E. coli]: Secondary | ICD-10-CM | POA: Diagnosis not present

## 2016-05-19 DIAGNOSIS — R197 Diarrhea, unspecified: Secondary | ICD-10-CM

## 2016-05-19 DIAGNOSIS — R112 Nausea with vomiting, unspecified: Secondary | ICD-10-CM | POA: Diagnosis present

## 2016-05-19 DIAGNOSIS — E871 Hypo-osmolality and hyponatremia: Secondary | ICD-10-CM | POA: Diagnosis present

## 2016-05-19 DIAGNOSIS — F1020 Alcohol dependence, uncomplicated: Secondary | ICD-10-CM | POA: Diagnosis present

## 2016-05-19 DIAGNOSIS — Z7952 Long term (current) use of systemic steroids: Secondary | ICD-10-CM | POA: Diagnosis not present

## 2016-05-19 DIAGNOSIS — J189 Pneumonia, unspecified organism: Secondary | ICD-10-CM | POA: Diagnosis present

## 2016-05-19 DIAGNOSIS — J441 Chronic obstructive pulmonary disease with (acute) exacerbation: Secondary | ICD-10-CM | POA: Diagnosis present

## 2016-05-19 DIAGNOSIS — R0682 Tachypnea, not elsewhere classified: Secondary | ICD-10-CM

## 2016-05-19 LAB — URINALYSIS, COMPLETE (UACMP) WITH MICROSCOPIC
BILIRUBIN URINE: NEGATIVE
Glucose, UA: NEGATIVE mg/dL
KETONES UR: NEGATIVE mg/dL
Nitrite: NEGATIVE
PH: 5 (ref 5.0–8.0)
PROTEIN: 30 mg/dL — AB
SQUAMOUS EPITHELIAL / LPF: NONE SEEN
Specific Gravity, Urine: 1.013 (ref 1.005–1.030)

## 2016-05-19 LAB — URINE DRUG SCREEN, QUALITATIVE (ARMC ONLY)
Amphetamines, Ur Screen: NOT DETECTED
BARBITURATES, UR SCREEN: NOT DETECTED
Benzodiazepine, Ur Scrn: NOT DETECTED
CANNABINOID 50 NG, UR ~~LOC~~: NOT DETECTED
COCAINE METABOLITE, UR ~~LOC~~: POSITIVE — AB
MDMA (Ecstasy)Ur Screen: NOT DETECTED
Methadone Scn, Ur: NOT DETECTED
Opiate, Ur Screen: NOT DETECTED
Phencyclidine (PCP) Ur S: NOT DETECTED
TRICYCLIC, UR SCREEN: NOT DETECTED

## 2016-05-19 LAB — CBC
HEMATOCRIT: 49.4 % (ref 40.0–52.0)
HEMOGLOBIN: 16.2 g/dL (ref 13.0–18.0)
MCH: 28 pg (ref 26.0–34.0)
MCHC: 32.8 g/dL (ref 32.0–36.0)
MCV: 85.6 fL (ref 80.0–100.0)
Platelets: 86 10*3/uL — ABNORMAL LOW (ref 150–440)
RBC: 5.78 MIL/uL (ref 4.40–5.90)
RDW: 15.8 % — ABNORMAL HIGH (ref 11.5–14.5)
WBC: 9.1 10*3/uL (ref 3.8–10.6)

## 2016-05-19 LAB — COMPREHENSIVE METABOLIC PANEL
ALBUMIN: 2.7 g/dL — AB (ref 3.5–5.0)
ALT: 24 U/L (ref 17–63)
ANION GAP: 8 (ref 5–15)
AST: 51 U/L — ABNORMAL HIGH (ref 15–41)
Alkaline Phosphatase: 90 U/L (ref 38–126)
BUN: 32 mg/dL — ABNORMAL HIGH (ref 6–20)
CHLORIDE: 94 mmol/L — AB (ref 101–111)
CO2: 20 mmol/L — AB (ref 22–32)
Calcium: 8.2 mg/dL — ABNORMAL LOW (ref 8.9–10.3)
Creatinine, Ser: 1.24 mg/dL (ref 0.61–1.24)
GFR calc non Af Amer: >60 mL/min (ref >60)
Glucose, Bld: 191 mg/dL — ABNORMAL HIGH (ref 65–99)
POTASSIUM: 4.5 mmol/L (ref 3.5–5.1)
SODIUM: 122 mmol/L — AB (ref 135–145)
Total Bilirubin: 4 mg/dL — ABNORMAL HIGH (ref 0.3–1.2)
Total Protein: 7.7 g/dL (ref 6.5–8.1)

## 2016-05-19 LAB — MAGNESIUM: MAGNESIUM: 2.1 mg/dL (ref 1.7–2.4)

## 2016-05-19 LAB — LIPASE, BLOOD: LIPASE: 51 U/L (ref 11–51)

## 2016-05-19 LAB — PROTIME-INR
INR: 1.48
Prothrombin Time: 18.1 seconds — ABNORMAL HIGH (ref 11.4–15.2)

## 2016-05-19 LAB — GLUCOSE, CAPILLARY: Glucose-Capillary: 111 mg/dL — ABNORMAL HIGH (ref 65–99)

## 2016-05-19 MED ORDER — SODIUM CHLORIDE 0.9 % IV BOLUS (SEPSIS)
1000.0000 mL | Freq: Once | INTRAVENOUS | Status: AC
  Administered 2016-05-19: 1000 mL via INTRAVENOUS

## 2016-05-19 MED ORDER — IOPAMIDOL (ISOVUE-300) INJECTION 61%
30.0000 mL | Freq: Once | INTRAVENOUS | Status: AC | PRN
  Administered 2016-05-19: 30 mL via ORAL

## 2016-05-19 MED ORDER — ONDANSETRON HCL 4 MG/2ML IJ SOLN
4.0000 mg | Freq: Once | INTRAMUSCULAR | Status: AC
  Administered 2016-05-19: 4 mg via INTRAVENOUS
  Filled 2016-05-19: qty 2

## 2016-05-19 MED ORDER — VITAMIN B-1 100 MG PO TABS
100.0000 mg | ORAL_TABLET | Freq: Every day | ORAL | Status: DC
  Administered 2016-05-20 – 2016-05-22 (×3): 100 mg via ORAL
  Filled 2016-05-19 (×3): qty 1

## 2016-05-19 MED ORDER — SODIUM CHLORIDE 0.9 % IV SOLN
1.0000 mg | Freq: Once | INTRAVENOUS | Status: AC
  Administered 2016-05-19: 1 mg via INTRAVENOUS
  Filled 2016-05-19: qty 0.2

## 2016-05-19 MED ORDER — SENNOSIDES-DOCUSATE SODIUM 8.6-50 MG PO TABS: 1.0000 | ORAL_TABLET | Freq: Every evening | ORAL | Status: DC | PRN

## 2016-05-19 MED ORDER — THIAMINE HCL 100 MG/ML IJ SOLN: 100.0000 mg | Freq: Every day | INTRAMUSCULAR | Status: DC

## 2016-05-19 MED ORDER — TRAMADOL HCL 50 MG PO TABS
50.0000 mg | ORAL_TABLET | Freq: Four times a day (QID) | ORAL | Status: DC | PRN
  Administered 2016-05-21 – 2016-05-22 (×2): 50 mg via ORAL
  Filled 2016-05-19 (×2): qty 1

## 2016-05-19 MED ORDER — IOPAMIDOL (ISOVUE-300) INJECTION 61%
100.0000 mL | Freq: Once | INTRAVENOUS | Status: AC | PRN
  Administered 2016-05-19: 100 mL via INTRAVENOUS

## 2016-05-19 MED ORDER — FLEET ENEMA 7-19 GM/118ML RE ENEM: 1.0000 | ENEMA | Freq: Once | RECTAL | Status: DC | PRN

## 2016-05-19 MED ORDER — ADULT MULTIVITAMIN W/MINERALS CH
1.0000 | ORAL_TABLET | Freq: Every day | ORAL | Status: DC
  Administered 2016-05-19 – 2016-05-22 (×4): 1 via ORAL
  Filled 2016-05-19 (×4): qty 1

## 2016-05-19 MED ORDER — KETOROLAC TROMETHAMINE 30 MG/ML IJ SOLN
30.0000 mg | Freq: Once | INTRAMUSCULAR | Status: AC
  Administered 2016-05-19: 30 mg via INTRAVENOUS
  Filled 2016-05-19: qty 1

## 2016-05-19 MED ORDER — LORAZEPAM 2 MG/ML IJ SOLN: 1.0000 mg | Freq: Four times a day (QID) | INTRAMUSCULAR | Status: DC | PRN

## 2016-05-19 MED ORDER — FOLIC ACID 1 MG PO TABS
1.0000 mg | ORAL_TABLET | Freq: Every day | ORAL | Status: DC
  Administered 2016-05-19 – 2016-05-22 (×4): 1 mg via ORAL
  Filled 2016-05-19 (×4): qty 1

## 2016-05-19 MED ORDER — ACETAMINOPHEN 325 MG PO TABS
650.0000 mg | ORAL_TABLET | Freq: Once | ORAL | Status: AC
  Administered 2016-05-19: 650 mg via ORAL
  Filled 2016-05-19: qty 2

## 2016-05-19 MED ORDER — THIAMINE HCL 100 MG/ML IJ SOLN
100.0000 mg | Freq: Every day | INTRAMUSCULAR | Status: DC
  Administered 2016-05-19: 100 mg via INTRAVENOUS
  Filled 2016-05-19: qty 2

## 2016-05-19 MED ORDER — LORAZEPAM 1 MG PO TABS
1.0000 mg | ORAL_TABLET | Freq: Four times a day (QID) | ORAL | Status: DC | PRN
  Administered 2016-05-20 – 2016-05-21 (×2): 1 mg via ORAL
  Filled 2016-05-19 (×2): qty 1

## 2016-05-19 MED ORDER — SODIUM CHLORIDE 0.9 % IV SOLN
INTRAVENOUS | Status: DC
  Administered 2016-05-19 – 2016-05-22 (×6): via INTRAVENOUS

## 2016-05-19 MED ORDER — INSULIN ASPART 100 UNIT/ML ~~LOC~~ SOLN
0.0000 [IU] | Freq: Three times a day (TID) | SUBCUTANEOUS | Status: DC
  Administered 2016-05-20 – 2016-05-21 (×3): 1 [IU] via SUBCUTANEOUS
  Administered 2016-05-21: 17:00:00 3 [IU] via SUBCUTANEOUS
  Administered 2016-05-21: 2 [IU] via SUBCUTANEOUS
  Filled 2016-05-19 (×2): qty 1
  Filled 2016-05-19: qty 2
  Filled 2016-05-19: qty 3
  Filled 2016-05-19: qty 1

## 2016-05-19 NOTE — ED Notes (Signed)
Pt at [facility]

## 2016-05-19 NOTE — ED Provider Notes (Addendum)
[facility] Emergency Department Provider Note  ____________________________________________   I have reviewed the triage vital signs and the nursing notes.   HISTORY  Chief Complaint Emesis and Headache    HPI Donald Bowers is a 61 y.o. male who presents today complaining of vomiting, abdominal pain especially the right upper quadrant, feeling dehydrated, and headache. Headache was gradual in onset after all these other symptoms. Not the worst headache of life. He states he feels dehydrated. He states he has had some of diffuse abdominal pain especially vomiting. Abdominal pain is mostly right upper quadrant. Vomiting abdominal pain started before the headache. He states that he has had no focal numbness or weakness. He denies any difficulty talking. Patient is a significant alcohol drinker. He drinks at least 18 beers on the weekend. Has never had withdrawal. Does not recall his last beer. Has been having vomiting since Saturday he believes. He does have a history of hepatitis C. The pain in his abdomen is mostly in the right upper quadrant, he cannot further characterize it. Nothing makes it better nothing makes it worse except for vomiting. No hematemesis, no bright red blood per rectum, no melena, the patient states that he has had possible subjective fevers at home. Strict abdominal surgery. He doesn't history of COPD. He denies chest pain or shortness of breath. States he has not had a bowel movement in a few days either.     Past Medical History:  Diagnosis Date  . COPD (chronic obstructive pulmonary disease) ([place])   . Hepatitis C     There are no active problems to display for this patient.   Past Surgical History:  Procedure Laterality Date  . CARDIAC CATHETERIZATION      Prior to Admission medications   Medication Sig Start Date End Date Taking? Authorizing Provider  albuterol (PROVENTIL HFA;VENTOLIN HFA) 108 (90 Base) MCG/ACT inhaler  Inhale 2 puffs into the lungs every 6 (six) hours as needed for wheezing or shortness of breath. 09/21/15   Rudene Re, MD  doxycycline (MONODOX) 100 MG capsule Take 1 capsule (100 mg total) by mouth 2 (two) times daily. 10/23/15   Sable Feil, PA-C  oxyCODONE-acetaminophen (PERCOCET) 7.5-325 MG tablet Take 1 tablet by mouth every 4 (four) hours as needed for severe pain. 10/23/15 10/22/16  Sable Feil, PA-C  predniSONE (DELTASONE) 20 MG tablet Take 3 tablets (60 mg total) by mouth daily. 09/22/15 09/24/16  Rudene Re, MD    Allergies Patient has no known allergies.  History reviewed. No pertinent family history.  Social History Social History  Substance Use Topics  . Smoking status: Current Every Day Smoker    Packs/day: 0.50    Types: Cigarettes  . Smokeless tobacco: Not on file  . Alcohol use Yes     Comment: weekly/daily    Review of Systems Constitutional: No fever/chills Eyes: No visual changes. ENT: No sore throat. No stiff neck no neck pain Cardiovascular: Denies chest pain. Respiratory: Denies shortness of breath. Gastrointestinal:   Positive vomiting.  No diarrhea.  No constipation. Genitourinary: Negative for dysuria. Musculoskeletal: Negative lower extremity swelling Skin: Negative for rash. Neurological: Negative for severe headaches, focal weakness or numbness. 10-point ROS otherwise negative.  ____________________________________________   PHYSICAL EXAM:  VITAL SIGNS: ED Triage Vitals [05/19/16 1357]  Enc Vitals Group     BP 128/67     Pulse Rate (!) 13     Resp 18     Temp 99.6 F (37.6 C)  Temp Source Oral     SpO2 95 %     Weight 200 lb (90.7 kg)     Height 6' (1.829 m)     Head Circumference      Peak Flow      Pain Score 9     Pain Loc      Pain Edu?      Excl. in [place]?     Constitutional: Alert and oriented. No acute medical distress, Eyes: Conjunctivae are normal. PERRL. EOMI. Head: Atraumatic. Nose: No  congestion/rhinnorhea. Mouth/Throat: Mucous membranes are Dry.  Oropharynx non-erythematous. Neck: No stridor.   Nontender with no meningismus Cardiovascular: Normal rate, regular rhythm. Grossly normal heart sounds.  Good peripheral circulation. Respiratory: Normal respiratory effort.  No retractions. Lungs CTAB. Abdominal: Soft and minimal tenderness to palpation in the right upper quadrant with. No distention. No guarding no rebound Back:  There is no focal tenderness or step off.  there is no midline tenderness there are no lesions noted. there is no CVA tenderness Musculoskeletal: No lower extremity tenderness, no upper extremity tenderness. No joint effusions, no DVT signs strong distal pulses no edema Neurologic:  Normal speech and language. No gross focal neurologic deficits are appreciated.  Skin:  Skin is warm, dry and intact. No rash noted. Psychiatric: Mood and affect are normal. Speech and behavior are normal.  ____________________________________________   LABS (all labs ordered are listed, but only abnormal results are displayed)  Labs Reviewed  COMPREHENSIVE METABOLIC PANEL - Abnormal; Notable for the following:       Result Value   Sodium 122 (*)    Chloride 94 (*)    CO2 20 (*)    Glucose, Bld 191 (*)    BUN 32 (*)    Calcium 8.2 (*)    Albumin 2.7 (*)    AST 51 (*)    Total Bilirubin 4.0 (*)    All other components within normal limits  CBC - Abnormal; Notable for the following:    RDW 15.8 (*)    Platelets 86 (*)    All other components within normal limits  LIPASE, BLOOD  MAGNESIUM  URINALYSIS, COMPLETE (UACMP) WITH MICROSCOPIC   ____________________________________________  EKG  I personally interpreted any EKGs ordered by me or triage  ____________________________________________  RADIOLOGY  I reviewed any imaging ordered by me or triage that were performed during my shift and, if possible, patient and/or family made aware of any abnormal  findings. ____________________________________________   PROCEDURES  Procedure(s) performed: None  Procedures  Critical Care performed: None  ____________________________________________   INITIAL IMPRESSION / ASSESSMENT AND PLAN / ED COURSE  Pertinent labs & imaging results that were available during my care of the patient were reviewed by me and considered in my medical decision making (see chart for details).  Patient here with nausea vomiting and bowel pain. He does have some focal right upper quadrant pain rated raising the risk of gallbladder disease. His bilirubin is also elevated. Patient also does have hepatitis C. This could represent some part of this pathology. We will give him IV fluids, thiamine and folate. He does not appear to be in withdrawal although he is a very significant drinker. Sodium is low, alcohol could also be determining to his lab abnormalities including a cytopenia. Patient is also hyponatremic. Passively could be contributing to his headache. His headache is not worst of life. I did obtain a CT of his head as a precaution which is negative. I don't think he  has a bleed or evidence of meningitis. Patient's real complaint is abdominal pain and vomiting she's been making his headache worse. The headache is a significantly secondary complaint for the patient. He is neurologically intact at this time. We will continue to observe him closely here while work him up for these for his complaints.   ----------------------------------------- 5:46 PM on 05/19/2016 -----------------------------------------  Administration states headache is nearly gone, he has no complaints at this time. Feels less nauseated. CT scan has been ordered after radiology interpretation of ultrasound suggested CT would be better. ____________________________________________   FINAL CLINICAL IMPRESSION(S) / ED DIAGNOSES  Final diagnoses:  Abdominal pain, vomiting, and diarrhea       This chart was dictated using voice recognition software.  Despite best efforts to proofread,  errors can occur which can change meaning.      Schuyler Amor, MD 05/19/16 [place], MD 05/19/16 [phone]

## 2016-05-19 NOTE — ED Triage Notes (Signed)
States headache, and vomiting and fever for 3 days, states cold chills and feeling as if he is dehydrated, awake and alert

## 2016-05-19 NOTE — H&P (Signed)
[facility] [place] NAME: Donald Bowers    MR#:  [id number]  DATE OF BIRTH:  08-05-1955  DATE OF ADMISSION:  05/19/2016  PRIMARY CARE PHYSICIAN: Dion Body, MD   REQUESTING/REFERRING PHYSICIAN: Dr Burlene Arnt  CHIEF COMPLAINT:  Nausea and vomiting for 3 days.  HISTORY OF PRESENT ILLNESS:  Donald Bowers  is a 61 y.o. male with a known history of Alcohol abuse, hepatitis C, COPD with ongoing tobacco abuse comes to the emergency room after he started having nausea vomiting for last 3 days. Patient has not eaten since Friday. No diarrhea. Denies abdominal pain. He's been having dry heaves. He was found to have sodium of 122 appears clinically dehydrated (admitted for intractable nausea vomiting acute hyponatremia and dehydration.  Patient admits to drinking last weekend which is about 5-6 days ago. He denies any DTs in the past. He has history of chronic alcoholism  PAST MEDICAL HISTORY:   Past Medical History:  Diagnosis Date  . COPD (chronic obstructive pulmonary disease) ([place])   . Hepatitis C     PAST SURGICAL HISTOIRY:   Past Surgical History:  Procedure Laterality Date  . CARDIAC CATHETERIZATION      SOCIAL HISTORY:   Social History  Substance Use Topics  . Smoking status: Current Every Day Smoker    Packs/day: 0.50    Types: Cigarettes  . Smokeless tobacco: Not on file  . Alcohol use Yes     Comment: weekly/daily    FAMILY HISTORY:  History reviewed. No pertinent family history.  DRUG ALLERGIES:  No Known Allergies  REVIEW OF SYSTEMS:  Review of Systems  Constitutional: Negative for chills, fever and weight loss.  HENT: Negative for ear discharge, ear pain and nosebleeds.   Eyes: Negative for blurred vision, pain and discharge.  Respiratory: Negative for sputum production, shortness of breath, wheezing and stridor.   Cardiovascular: Negative for chest pain, palpitations, orthopnea and PND.   Gastrointestinal: Positive for nausea and vomiting. Negative for abdominal pain and diarrhea.  Genitourinary: Negative for frequency and urgency.  Musculoskeletal: Negative for back pain and joint pain.  Neurological: Positive for weakness. Negative for sensory change, speech change and focal weakness.  Psychiatric/Behavioral: Negative for depression and hallucinations. The patient is not nervous/anxious.      MEDICATIONS AT HOME:   Prior to Admission medications   Medication Sig Start Date End Date Taking? Authorizing Provider  albuterol (PROVENTIL HFA;VENTOLIN HFA) 108 (90 Base) MCG/ACT inhaler Inhale 2 puffs into the lungs every 6 (six) hours as needed for wheezing or shortness of breath. 09/21/15   Rudene Re, MD  doxycycline (MONODOX) 100 MG capsule Take 1 capsule (100 mg total) by mouth 2 (two) times daily. 10/23/15   Sable Feil, PA-C  oxyCODONE-acetaminophen (PERCOCET) 7.5-325 MG tablet Take 1 tablet by mouth every 4 (four) hours as needed for severe pain. 10/23/15 10/22/16  Sable Feil, PA-C  predniSONE (DELTASONE) 20 MG tablet Take 3 tablets (60 mg total) by mouth daily. 09/22/15 09/24/16  Rudene Re, MD      VITAL SIGNS:  Blood pressure (!) 106/59, pulse 98, temperature 99.6 F (37.6 C), temperature source Oral, resp. rate (!) 25, height 6' (1.829 m), weight 90.7 kg (200 lb), SpO2 90 %.  PHYSICAL EXAMINATION:  GENERAL:  61 y.o.-year-old patient lying in the bed with no acute distress.  EYES: Pupils equal, round, reactive to light and accommodation. No scleral icterus. Extraocular muscles intact.  HEENT: Head atraumatic, normocephalic.  Oropharynx and nasopharynx clear.  NECK:  Supple, no jugular venous distention. No thyroid enlargement, no tenderness.  LUNGS: Normal breath sounds bilaterally, no wheezing, rales,rhonchi or crepitation. No use of accessory muscles of respiration.  CARDIOVASCULAR: S1, S2 normal. No murmurs, rubs, or gallops.  ABDOMEN: Soft,  nontender, nondistended. Bowel sounds present. No organomegaly or mass.  EXTREMITIES: No pedal edema, cyanosis, or clubbing.  NEUROLOGIC: Cranial nerves II through XII are intact. Muscle strength 5/5 in all extremities. Sensation intact. Gait not checked.  PSYCHIATRIC: The patient is alert and oriented x 3.  SKIN: No obvious rash, lesion, or ulcer.   LABORATORY PANEL:   CBC  Recent Labs Lab 05/19/16 1401  WBC 9.1  HGB 16.2  HCT 49.4  PLT 86*   ------------------------------------------------------------------------------------------------------------------  Chemistries   Recent Labs Lab 05/19/16 1401  NA 122*  K 4.5  CL 94*  CO2 20*  GLUCOSE 191*  BUN 32*  CREATININE 1.24  CALCIUM 8.2*  MG 2.1  AST 51*  ALT 24  ALKPHOS 90  BILITOT 4.0*   ------------------------------------------------------------------------------------------------------------------  Cardiac Enzymes No results for input(s): TROPONINI in the last 168 hours. ------------------------------------------------------------------------------------------------------------------  RADIOLOGY:  Ct Head Wo Contrast  Result Date: 05/19/2016 CLINICAL DATA:  Headache with fever and vomiting for 3 days. Dizziness. EXAM: CT HEAD WITHOUT CONTRAST TECHNIQUE: Contiguous axial images were obtained from the base of the skull through the vertex without intravenous contrast. COMPARISON:  Jul 16, 2008. FINDINGS: Brain: The ventricles are normal in size and configuration. There is no intracranial mass, hemorrhage, extra-axial fluid collection, or midline shift. Gray-white compartments appear normal. No acute infarct evident. Vascular: No hyperdense vessel. There is calcification in each carotid siphon region. Skull: The bony calvarium appears intact. Sinuses/Orbits: There is slight mucosal thickening in several ethmoid air cells bilaterally. Other visualized paranasal sinuses are clear. Orbits appear symmetric bilaterally.  Other: Visualized mastoid air cells are clear. IMPRESSION: Foci of atherosclerotic vascular calcification noted. No intracranial mass, hemorrhage, or extra-axial fluid collection. Gray-white compartments are normal. Electronically Signed   By: Lowella Grip III M.D.   On: 05/19/2016 15:20   Ct Abdomen Pelvis W Contrast  Result Date: 05/19/2016 CLINICAL DATA:  Right upper quadrant abdominal pain, vomiting EXAM: CT ABDOMEN AND PELVIS WITH CONTRAST TECHNIQUE: Multidetector CT imaging of the abdomen and pelvis was performed using the standard protocol following bolus administration of intravenous contrast. CONTRAST:  179mL ISOVUE-300 IOPAMIDOL (ISOVUE-300) INJECTION 61% COMPARISON:  Right upper quadrant ultrasound dated 05/19/2016 FINDINGS: Lower chest: Mild dependent atelectasis in the right lower lobe. Three vessel coronary atherosclerosis. Hepatobiliary: Cirrhosis. Infiltrating lesion centered in segment 7, measuring approximately 8.0 x 9.4 cm (series 2/image 24), highly suspicious for infiltrating HCC with associated portal vein thrombus (described below). Some degree of superimposed perfusion anomaly is also possible. Additional 4.5 x 5.2 cm lesion in segment 4A (series 2/image 12), also highly suspicious for HCC. 2.5 cm subcapsular lesion in segment 8 (series 2/image 16), indeterminate. Gallbladder is unremarkable. No intrahepatic or extrahepatic duct dilatation. Pancreas: Within normal limits. Spleen: Splenomegaly, measuring 16.6 cm in craniocaudal dimension. Adrenals/Urinary Tract: Adrenal glands are within normal limits. Kidneys are unremarkable. No intrahepatic or extrahepatic ductal dilatation. Mildly thick-walled bladder. Trace nondependent gas (series 2/ image 84). Stomach/Bowel: Stomach is within normal limits. No evidence of bowel obstruction. Appendix is not discretely visualized. Extensive sigmoid diverticulosis, without evidence of diverticulitis. Vascular/Lymphatic: No evidence of abdominal  aortic aneurysm. Atherosclerotic calcifications of the abdominal aorta and branch vessels. Gastroesophageal varices.  Recanalized periumbilical vein. Occlusion  of the posterior branch of the right portal vein (series 2/image 20), tumor thrombus not excluded. Reproductive: Prostate is grossly unremarkable. Other: Trace pelvic ascites. Musculoskeletal: Degenerative changes of the visualized thoracolumbar spine. IMPRESSION: Cirrhosis. Findings suspicious for multifocal HCC, as described above, including an 8.0 x 9.4 cm infiltrating lesion in segment 7 and a 5.2 cm lesion in segment 4A. Occlusion of the posterior branch of the right portal vein, tumor thrombus not excluded. Stigmata of portal hypertension, including splenomegaly, gastroesophageal varices, and recanalized periumbilical vein. Electronically Signed   By: Julian Hy M.D.   On: 05/19/2016 19:36   Dg Abdomen Acute W/chest  Result Date: 05/19/2016 CLINICAL DATA:  Vomiting and fever, 3 days duration. EXAM: DG ABDOMEN ACUTE W/ 1V CHEST COMPARISON:  None. FINDINGS: Bowel gas pattern shows gas in small and large bowel but no abnormally dilated loops. Small bowel loops in the upper abdomen are slightly prominent, as can be seen with abnormal processes such as pancreatitis or early/ partial small bowel obstruction. No free air. No abnormal calcifications. Chronic degenerative changes affect spine. One-view chest shows normal heart and mediastinal contours except for tortuosity of the aorta. There is mild chronic scarring at the lung bases. No sign of active infiltrate, effusion or collapse. No free air. IMPRESSION: No specific or definitely pathologic finding. No sign of high-grade obstruction or free air. Slightly prominent loops of upper abdominal small intestine can be seen in abnormal processes such as pancreatitis or early/partial small bowel obstruction. Electronically Signed   By: Nelson Chimes M.D.   On: 05/19/2016 16:07   US Abdomen Limited  Ruq  Result Date: 05/19/2016 CLINICAL DATA:  Abdominal pain with vomiting EXAM: US ABDOMEN LIMITED - RIGHT UPPER QUADRANT COMPARISON:  05/19/2016 FINDINGS: Gallbladder: No calcified stones or sludge. Mild diffuse wall thickening measuring 3.4 mm. Negative sonographic Murphy's. Common bile duct: Diameter: 3.7 mm Liver: Diffuse heterogenous coarse echotexture. Hypoechoic nodules are present, the most prominent in the right lobe is seen anteriorly and measures 0.6 x 0.4 x 0.5 cm. There is an echogenic mass with hypoechoic center within the posterior right lobe measuring 2 x 1.8 x 1 2 cm. IMPRESSION: 1. No calcified gallstones. Nonspecific mild diffuse wall thickening but negative sonographic Murphy. Findings could be secondary to acute or chronic cholecystitis, liver disease, or edema forming states. 2. Diffuse coarse heterogeneous appearance of the liver with multiple nodules. Suggest CT versus nonemergent MRI for further evaluation. Electronically Signed   By: Donavan Foil M.D.   On: 05/19/2016 17:09    EKG:    IMPRESSION AND PLAN:   Donald Bowers  is a 61 y.o. male with a known history of Alcohol abuse, hepatitis C, COPD with ongoing tobacco abuse comes to the emergency room after he started having nausea vomiting for last 3 days. Patient has not eaten since Friday.   1. Intractable nausea vomiting could be viral syndrome -Patient currently dry heaving. No vomiting since morning. Feeling hungry. -Appears clinically dehydrated -IV fluids, IV Zofran when necessary  2. Acute hyponatremia secondary to dehydration from GI losses -IV fluids and monitor sodium  3. Chronic alcoholism last drink was last weekend patient drank beer denies symptoms of DTs -We'll place patient on CIWA protocol -Advised on abstinence  4. Tobacco abuse advised smoking cessation  5. Chronic thrombo-Cytopenia appears alcohol related No active bleeding. Continue to monitor.  6. DVT prophylaxis SCDs avoid  antiplatelet secondary to low platelet count  All the records are reviewed and case discussed with ED provider.  Management plans discussed with the patient, family and they are in agreement.  CODE STATUS: Full  TOTAL TIME TAKING CARE OF THIS PATIENT: 50 minutes.    Donald Bowers M.D on 05/19/2016 at 7:44 PM  Between 7am to 6pm - Pager - [phone]  After 6pm go to [email or web address] - password [email or web address] [facility]  [facility]  Office  [phone]  CC: Primary care physician; Dion Body, MD

## 2016-05-19 NOTE — ED Notes (Signed)
Patient transported to CT 

## 2016-05-20 ENCOUNTER — Inpatient Hospital Stay: Payer: 59

## 2016-05-20 LAB — BLOOD CULTURE ID PANEL (REFLEXED)
Acinetobacter baumannii: NOT DETECTED
CANDIDA KRUSEI: NOT DETECTED
CARBAPENEM RESISTANCE: NOT DETECTED
Candida albicans: NOT DETECTED
Candida glabrata: NOT DETECTED
Candida parapsilosis: NOT DETECTED
Candida tropicalis: NOT DETECTED
ENTEROCOCCUS SPECIES: NOT DETECTED
Enterobacter cloacae complex: NOT DETECTED
Enterobacteriaceae species: DETECTED — AB
Escherichia coli: DETECTED — AB
Haemophilus influenzae: NOT DETECTED
KLEBSIELLA OXYTOCA: NOT DETECTED
Klebsiella pneumoniae: NOT DETECTED
LISTERIA MONOCYTOGENES: NOT DETECTED
NEISSERIA MENINGITIDIS: NOT DETECTED
Proteus species: NOT DETECTED
Pseudomonas aeruginosa: NOT DETECTED
SERRATIA MARCESCENS: NOT DETECTED
STAPHYLOCOCCUS AUREUS BCID: NOT DETECTED
STAPHYLOCOCCUS SPECIES: NOT DETECTED
STREPTOCOCCUS PNEUMONIAE: NOT DETECTED
Streptococcus agalactiae: NOT DETECTED
Streptococcus pyogenes: NOT DETECTED
Streptococcus species: NOT DETECTED

## 2016-05-20 LAB — BASIC METABOLIC PANEL
ANION GAP: 8 (ref 5–15)
BUN: 38 mg/dL — ABNORMAL HIGH (ref 6–20)
CHLORIDE: 94 mmol/L — AB (ref 101–111)
CO2: 21 mmol/L — AB (ref 22–32)
Calcium: 7.9 mg/dL — ABNORMAL LOW (ref 8.9–10.3)
Creatinine, Ser: 1.2 mg/dL (ref 0.61–1.24)
GFR calc non Af Amer: >60 mL/min (ref >60)
Glucose, Bld: 118 mg/dL — ABNORMAL HIGH (ref 65–99)
Potassium: 4.2 mmol/L (ref 3.5–5.1)
SODIUM: 123 mmol/L — AB (ref 135–145)

## 2016-05-20 LAB — GLUCOSE, CAPILLARY
GLUCOSE-CAPILLARY: 106 mg/dL — AB (ref 65–99)
GLUCOSE-CAPILLARY: 160 mg/dL — AB (ref 65–99)
Glucose-Capillary: 150 mg/dL — ABNORMAL HIGH (ref 65–99)
Glucose-Capillary: 134 mg/dL — ABNORMAL HIGH (ref 65–99)

## 2016-05-20 MED ORDER — ACETAMINOPHEN 325 MG PO TABS
650.0000 mg | ORAL_TABLET | Freq: Once | ORAL | Status: AC
  Administered 2016-05-20: 650 mg via ORAL
  Filled 2016-05-20: qty 2

## 2016-05-20 MED ORDER — IPRATROPIUM-ALBUTEROL 0.5-2.5 (3) MG/3ML IN SOLN: 3.0000 mL | Freq: Four times a day (QID) | RESPIRATORY_TRACT | Status: DC | PRN

## 2016-05-20 MED ORDER — BOOST / RESOURCE BREEZE PO LIQD
1.0000 | Freq: Three times a day (TID) | ORAL | Status: DC
  Administered 2016-05-20 – 2016-05-22 (×6): 1 via ORAL

## 2016-05-20 MED ORDER — CEFTRIAXONE SODIUM-DEXTROSE 2-2.22 GM-% IV SOLR: 2.0000 g | INTRAVENOUS | Status: DC

## 2016-05-20 MED ORDER — DEXTROSE 5 % IV SOLN
250.0000 mg | Freq: Every day | INTRAVENOUS | Status: DC
  Administered 2016-05-20: 12:00:00 250 mg via INTRAVENOUS
  Filled 2016-05-20: qty 250

## 2016-05-20 MED ORDER — CEFTRIAXONE SODIUM-DEXTROSE 2-2.22 GM-% IV SOLR
2.0000 g | Freq: Once | INTRAVENOUS | Status: AC
  Administered 2016-05-20: 02:00:00 2 g via INTRAVENOUS
  Filled 2016-05-20: qty 50

## 2016-05-20 MED ORDER — MEROPENEM-SODIUM CHLORIDE 1 GM/50ML IV SOLR
1.0000 g | Freq: Three times a day (TID) | INTRAVENOUS | Status: DC
  Administered 2016-05-20 – 2016-05-22 (×6): 1 g via INTRAVENOUS
  Filled 2016-05-20 (×8): qty 50

## 2016-05-20 MED ORDER — SODIUM CHLORIDE 0.9 % IV SOLN: 1.0000 g | Freq: Three times a day (TID) | INTRAVENOUS | Status: DC

## 2016-05-20 NOTE — Progress Notes (Signed)
Pharmacy Antibiotic Note  ZELIG GACEK is a 61 y.o. male admitted on 05/19/2016 with UTI.  Pharmacy has been consulted for ceftriaxone dosing.  Plan: Ceftriaxone 2 grams q 24 hours ordered.  Height: 6' (182.9 cm) Weight: 200 lb (90.7 kg) IBW/kg (Calculated) : 77.6  Temp (24hrs), Avg:100.6 F (38.1 C), Min:99.1 F (37.3 C), Max:103.1 F (39.5 C)   Recent Labs Lab 05/19/16 1401  WBC 9.1  CREATININE 1.24    Estimated Creatinine Clearance: 69.5 mL/min (by C-G formula based on SCr of 1.24 mg/dL).    No Known Allergies  Antimicrobials this admission: ceftriaxone 3/20 >>    >>   Dose adjustments this admission:   Microbiology results: 3/20 BCx: pending 3/19 UCx: pending      3/19 UA: LE(+) NO2(-) WBC TNTC  Thank you for allowing pharmacy to be a part of this patient's care.  Tyjon Bowen S 05/20/2016 1:19 AM

## 2016-05-20 NOTE — Progress Notes (Signed)
Initial Nutrition Assessment  DOCUMENTATION CODES:   Not applicable  INTERVENTION:  Advance diet per MD. Per patient he is tolerating CLD and per chart finishing 100% of CLD trays.   Provide Boost Breeze po TID, each supplement provides 250 kcal and 9 grams of protein.   Continue daily MVI, folate, thiamine.   NUTRITION DIAGNOSIS:   Inadequate oral intake related to poor appetite, nausea, vomiting as evidenced by 2.9% weight loss over 1 week, per patient/family report.  GOAL:   Patient will meet greater than or equal to 90% of their needs  MONITOR:   PO intake, Supplement acceptance, Diet advancement, Labs, Weight trends, I & O's  REASON FOR ASSESSMENT:   Malnutrition Screening Tool    ASSESSMENT:   61 year old male with PMHx of COPD, Hepatits C, EtOH abuse presented with 3 days of nausea and vomiting found to have sepsis, UTI, PNA.   Spoke with patient at bedside. Patient reports he has had a poor appetite since 05/16/2016 due to N/V after eating. He reports he did not have anything to eat since 3/16 but did have his first CLD tray 3/19. He reports he is tolerating the CLD well. Usual intake is 100% of three meals daily.   Patient reports UBW 200 lbs and that he has been losing weight this past week. RD obtained bed scale weight of 194.2 lbs. Patient has lost approximately 5.8 lbs (2.9% body weight) over 1 week, which is significant for time frame.  Meal Completion: 100% of CLD per chart  Medications reviewed and include: folic acid 1 mg daily, Novolog sliding scale TID with meals, MVI daily, thiamine 100 mg daily, NS @ 100 ml/hr, Ativan PRN.  Labs reviewed: CBG 106-150, Sodium 123, Chloride 94, CO2 21, BUN 38.   Nutrition-Focused physical exam completed. Findings are mild-moderate fat depletion noted in upper arm region only, no muscle depletion, and no edema.   Patient is at risk for acute malnutrition due to significant weight loss in one week. If patient remains  unable to meet 50% of energy intake for >/=5 days, will meet criteria for diagnosis of malnutrition.   Diet Order:  Diet clear liquid Room service appropriate? Yes; Fluid consistency: Thin  Skin:  Reviewed, no issues  Last BM:  PTA (05/16/2016 per chart)  Height:   Ht Readings from Last 1 Encounters:  05/19/16 6' (1.829 m)    Weight:   Wt Readings from Last 1 Encounters:  05/20/16 194 lb 3.2 oz (88.1 kg)    Ideal Body Weight:  80.9 kg  BMI:  Body mass index is 26.34 kg/m.  Estimated Nutritional Needs:   Kcal:  2080-2250 (MSJ x 1.2-1.3)  Protein:  90-105 grams (1-1.2 grams/kg)  Fluid:  2-2.2 L/day  EDUCATION NEEDS:   No education needs identified at this time  Willey Blade, MS, RD, LDN Pager: [phone] After Hours Pager: [phone]

## 2016-05-20 NOTE — Progress Notes (Signed)
Patient ID: Donald Bowers, male   DOB: [birth year], 61 y.o.   MRN: [id number]   Called by nursing regarding fever to 103.2, tachypnea and tachycardia.  Found to have a UTI.  Will draw blood cultures and start Rocephin.  Stat chest xray ordered.

## 2016-05-20 NOTE — Plan of Care (Signed)
Problem: Fluid Volume: Goal: Ability to maintain a balanced intake and output will improve Outcome: Progressing Earlier pt spike fever. MD notified. CXR and  blood cultures done. Started on Rocephin. Tylenol given. Ativan given for CIWA >8. Pt currently resting. Afebrile. Will continue to monitor.

## 2016-05-20 NOTE — Progress Notes (Signed)
PHARMACY - PHYSICIAN COMMUNICATION CRITICAL VALUE ALERT - BLOOD CULTURE IDENTIFICATION (BCID)  No results found for this or any previous visit.  Lab reports GNR in 3 of 4 bottles E. Coli KPC not detected. Called to [place] who handed it off to me.   Name of physician (or Provider) Contacted: Dr. Anselm Jungling  Changes to prescribed antibiotics required: Patient is currently on ceftriaxone 2 gm IV Q24H and azithromycin 250 mg IV Q24H for UTI and CAP. Discontinue current antibiotics and start meropenem 1 gm IV Q8H.  Laural Benes, Pharm.D., BCPS Clinical Pharmacist 05/20/2016  3:08 PM

## 2016-05-20 NOTE — Progress Notes (Signed)
[facility] [place] NAME: Donald Bowers    MR#:  [id number]  DATE OF BIRTH:  1955/10/15  SUBJECTIVE:  CHIEF COMPLAINT:   Chief Complaint  Patient presents with  . Emesis  . Headache     Came with vomiting for last 2-3 days, admitted with hyponatremia, now had fever last night. Denies any complains- found to have UTI and PNA on xray chest.  REVIEW OF SYSTEMS:  CONSTITUTIONAL: positive for fever, fatigue or weakness.  EYES: No blurred or double vision.  EARS, NOSE, AND THROAT: No tinnitus or ear pain.  RESPIRATORY: No cough, shortness of breath, wheezing or hemoptysis.  CARDIOVASCULAR: No chest pain, orthopnea, edema.  GASTROINTESTINAL: No nausea, vomiting, diarrhea or abdominal pain.  GENITOURINARY: No dysuria, hematuria.  ENDOCRINE: No polyuria, nocturia,  HEMATOLOGY: No anemia, easy bruising or bleeding SKIN: No rash or lesion. MUSCULOSKELETAL: No joint pain or arthritis.   NEUROLOGIC: No tingling, numbness, weakness.  PSYCHIATRY: No anxiety or depression.   ROS  DRUG ALLERGIES:  No Known Allergies  VITALS:  Blood pressure 98/62, pulse 88, temperature 97.5 F (36.4 C), temperature source Oral, resp. rate (!) 22, height 6' (1.829 m), weight 90.7 kg (200 lb), SpO2 99 %.  PHYSICAL EXAMINATION:  GENERAL:  61 y.o.-year-old patient lying in the bed with no acute distress.  EYES: Pupils equal, round, reactive to light and accommodation. No scleral icterus. Extraocular muscles intact.  HEENT: Head atraumatic, normocephalic. Oropharynx and nasopharynx clear.  NECK:  Supple, no jugular venous distention. No thyroid enlargement, no tenderness.  LUNGS: Normal breath sounds bilaterally, no wheezing, rales,rhonchi or crepitation. No use of accessory muscles of respiration.  CARDIOVASCULAR: S1, S2 normal. No murmurs, rubs, or gallops.  ABDOMEN: Soft, nontender, nondistended. Bowel sounds present. No organomegaly or mass.  EXTREMITIES: No  pedal edema, cyanosis, or clubbing.  NEUROLOGIC: Cranial nerves II through XII are intact. Muscle strength 5/5 in all extremities. Sensation intact. Gait not checked.  PSYCHIATRIC: The patient is alert and oriented x 3.  SKIN: No obvious rash, lesion, or ulcer.   Physical Exam LABORATORY PANEL:   CBC  Recent Labs Lab 05/19/16 1401  WBC 9.1  HGB 16.2  HCT 49.4  PLT 86*   ------------------------------------------------------------------------------------------------------------------  Chemistries   Recent Labs Lab 05/19/16 1401 05/20/16 0432  NA 122* 123*  K 4.5 4.2  CL 94* 94*  CO2 20* 21*  GLUCOSE 191* 118*  BUN 32* 38*  CREATININE 1.24 1.20  CALCIUM 8.2* 7.9*  MG 2.1  --   AST 51*  --   ALT 24  --   ALKPHOS 90  --   BILITOT 4.0*  --    ------------------------------------------------------------------------------------------------------------------  Cardiac Enzymes No results for input(s): TROPONINI in the last 168 hours. ------------------------------------------------------------------------------------------------------------------  RADIOLOGY:  Ct Head Wo Contrast  Result Date: 05/19/2016 CLINICAL DATA:  Headache with fever and vomiting for 3 days. Dizziness. EXAM: CT HEAD WITHOUT CONTRAST TECHNIQUE: Contiguous axial images were obtained from the base of the skull through the vertex without intravenous contrast. COMPARISON:  Jul 16, 2008. FINDINGS: Brain: The ventricles are normal in size and configuration. There is no intracranial mass, hemorrhage, extra-axial fluid collection, or midline shift. Gray-white compartments appear normal. No acute infarct evident. Vascular: No hyperdense vessel. There is calcification in each carotid siphon region. Skull: The bony calvarium appears intact. Sinuses/Orbits: There is slight mucosal thickening in several ethmoid air cells bilaterally. Other visualized paranasal sinuses are clear. Orbits appear symmetric bilaterally.  Other:  Visualized mastoid air cells are clear. IMPRESSION: Foci of atherosclerotic vascular calcification noted. No intracranial mass, hemorrhage, or extra-axial fluid collection. Gray-white compartments are normal. Electronically Signed   By: Lowella Grip III M.D.   On: 05/19/2016 15:20   Ct Abdomen Pelvis W Contrast  Result Date: 05/19/2016 CLINICAL DATA:  Right upper quadrant abdominal pain, vomiting EXAM: CT ABDOMEN AND PELVIS WITH CONTRAST TECHNIQUE: Multidetector CT imaging of the abdomen and pelvis was performed using the standard protocol following bolus administration of intravenous contrast. CONTRAST:  120mL ISOVUE-300 IOPAMIDOL (ISOVUE-300) INJECTION 61% COMPARISON:  Right upper quadrant ultrasound dated 05/19/2016 FINDINGS: Lower chest: Mild dependent atelectasis in the right lower lobe. Three vessel coronary atherosclerosis. Hepatobiliary: Cirrhosis. Infiltrating lesion centered in segment 7, measuring approximately 8.0 x 9.4 cm (series 2/image 24), highly suspicious for infiltrating HCC with associated portal vein thrombus (described below). Some degree of superimposed perfusion anomaly is also possible. Additional 4.5 x 5.2 cm lesion in segment 4A (series 2/image 12), also highly suspicious for HCC. 2.5 cm subcapsular lesion in segment 8 (series 2/image 16), indeterminate. Gallbladder is unremarkable. No intrahepatic or extrahepatic duct dilatation. Pancreas: Within normal limits. Spleen: Splenomegaly, measuring 16.6 cm in craniocaudal dimension. Adrenals/Urinary Tract: Adrenal glands are within normal limits. Kidneys are unremarkable. No intrahepatic or extrahepatic ductal dilatation. Mildly thick-walled bladder. Trace nondependent gas (series 2/ image 84). Stomach/Bowel: Stomach is within normal limits. No evidence of bowel obstruction. Appendix is not discretely visualized. Extensive sigmoid diverticulosis, without evidence of diverticulitis. Vascular/Lymphatic: No evidence of abdominal  aortic aneurysm. Atherosclerotic calcifications of the abdominal aorta and branch vessels. Gastroesophageal varices.  Recanalized periumbilical vein. Occlusion of the posterior branch of the right portal vein (series 2/image 20), tumor thrombus not excluded. Reproductive: Prostate is grossly unremarkable. Other: Trace pelvic ascites. Musculoskeletal: Degenerative changes of the visualized thoracolumbar spine. IMPRESSION: Cirrhosis. Findings suspicious for multifocal HCC, as described above, including an 8.0 x 9.4 cm infiltrating lesion in segment 7 and a 5.2 cm lesion in segment 4A. Occlusion of the posterior branch of the right portal vein, tumor thrombus not excluded. Stigmata of portal hypertension, including splenomegaly, gastroesophageal varices, and recanalized periumbilical vein. Electronically Signed   By: Julian Hy M.D.   On: 05/19/2016 19:36   Dg Chest Port 1 View  Result Date: 05/20/2016 CLINICAL DATA:  Acute onset of tachypnea.  Initial encounter. EXAM: PORTABLE CHEST 1 VIEW COMPARISON:  Chest radiograph performed 05/19/2016 FINDINGS: Patchy bibasilar airspace opacities may reflect pneumonia. No pleural effusion or pneumothorax is seen. The cardiomediastinal silhouette is normal in size. No acute osseous abnormalities are seen. There is chronic deformity of the right clavicle. IMPRESSION: Patchy bibasilar airspace opacities may reflect pneumonia. Electronically Signed   By: Garald Balding M.D.   On: 05/20/2016 01:24   Dg Abdomen Acute W/chest  Result Date: 05/19/2016 CLINICAL DATA:  Vomiting and fever, 3 days duration. EXAM: DG ABDOMEN ACUTE W/ 1V CHEST COMPARISON:  None. FINDINGS: Bowel gas pattern shows gas in small and large bowel but no abnormally dilated loops. Small bowel loops in the upper abdomen are slightly prominent, as can be seen with abnormal processes such as pancreatitis or early/ partial small bowel obstruction. No free air. No abnormal calcifications. Chronic  degenerative changes affect spine. One-view chest shows normal heart and mediastinal contours except for tortuosity of the aorta. There is mild chronic scarring at the lung bases. No sign of active infiltrate, effusion or collapse. No free air. IMPRESSION: No specific or definitely pathologic finding. No sign of high-grade  obstruction or free air. Slightly prominent loops of upper abdominal small intestine can be seen in abnormal processes such as pancreatitis or early/partial small bowel obstruction. Electronically Signed   By: Nelson Chimes M.D.   On: 05/19/2016 16:07   US Abdomen Limited Ruq  Result Date: 05/19/2016 CLINICAL DATA:  Abdominal pain with vomiting EXAM: US ABDOMEN LIMITED - RIGHT UPPER QUADRANT COMPARISON:  05/19/2016 FINDINGS: Gallbladder: No calcified stones or sludge. Mild diffuse wall thickening measuring 3.4 mm. Negative sonographic Murphy's. Common bile duct: Diameter: 3.7 mm Liver: Diffuse heterogenous coarse echotexture. Hypoechoic nodules are present, the most prominent in the right lobe is seen anteriorly and measures 0.6 x 0.4 x 0.5 cm. There is an echogenic mass with hypoechoic center within the posterior right lobe measuring 2 x 1.8 x 1 2 cm. IMPRESSION: 1. No calcified gallstones. Nonspecific mild diffuse wall thickening but negative sonographic Murphy. Findings could be secondary to acute or chronic cholecystitis, liver disease, or edema forming states. 2. Diffuse coarse heterogeneous appearance of the liver with multiple nodules. Suggest CT versus nonemergent MRI for further evaluation. Electronically Signed   By: Donavan Foil M.D.   On: 05/19/2016 17:09    ASSESSMENT AND PLAN:   Active Problems:   Hyponatremia  * sepsis   With UTI and Comm. Acquired pneumonia   IV rocephin, Zithromycine.   Cx sent on urine and Blood.   Get renal US to r/o pathology.  * Hyponatremia   Due to UTI and dehydration   IV fluids.  * Thrombocytopenia   Due to hep c and alcoholism, no  active bleed, monitor  * chronic alcoholism   On CIWA protocol, no withdrawal now.  * Active smoking and COPD without exacerbation   Cont duoneb for any symptoms.    All the records are reviewed and case discussed with Care Management/Social Workerr. Management plans discussed with the patient, family and they are in agreement.  CODE STATUS: full.  TOTAL TIME TAKING CARE OF THIS PATIENT: 35 minutes.     POSSIBLE D/C IN 1-2 DAYS, DEPENDING ON CLINICAL CONDITION.   Vaughan Basta M.D on 05/20/2016   Between 7am to 6pm - Pager - [phone]  After 6pm go to [email or web address] - password [email or web address] [facility]  Office  [phone]  CC: Primary care physician; Dion Body, MD  Note: This dictation was prepared with Dragon dictation along with smaller phrase technology. Any transcriptional errors that result from this process are unintentional.

## 2016-05-21 LAB — CBC
HEMATOCRIT: 44.4 % (ref 40.0–52.0)
HEMOGLOBIN: 15.1 g/dL (ref 13.0–18.0)
MCH: 28.8 pg (ref 26.0–34.0)
MCHC: 34.1 g/dL (ref 32.0–36.0)
MCV: 84.7 fL (ref 80.0–100.0)
Platelets: 63 10*3/uL — ABNORMAL LOW (ref 150–440)
RBC: 5.24 MIL/uL (ref 4.40–5.90)
RDW: 15.9 % — AB (ref 11.5–14.5)
WBC: 7.6 10*3/uL (ref 3.8–10.6)

## 2016-05-21 LAB — BASIC METABOLIC PANEL
Anion gap: 5 (ref 5–15)
BUN: 32 mg/dL — ABNORMAL HIGH (ref 6–20)
CALCIUM: 7.7 mg/dL — AB (ref 8.9–10.3)
CO2: 23 mmol/L (ref 22–32)
CREATININE: 1.2 mg/dL (ref 0.61–1.24)
Chloride: 98 mmol/L — ABNORMAL LOW (ref 101–111)
GFR calc non Af Amer: >60 mL/min (ref >60)
GLUCOSE: 124 mg/dL — AB (ref 65–99)
Potassium: 3.9 mmol/L (ref 3.5–5.1)
Sodium: 126 mmol/L — ABNORMAL LOW (ref 135–145)

## 2016-05-21 LAB — GLUCOSE, CAPILLARY
GLUCOSE-CAPILLARY: 157 mg/dL — AB (ref 65–99)
Glucose-Capillary: 130 mg/dL — ABNORMAL HIGH (ref 65–99)
Glucose-Capillary: 202 mg/dL — ABNORMAL HIGH (ref 65–99)

## 2016-05-21 NOTE — Progress Notes (Addendum)
[facility] at [place] NAME: Donald Bowers    MR#:  [id number]  DATE OF BIRTH:  March 03, 1956  SUBJECTIVE:  Sleepy. No fever  REVIEW OF SYSTEMS:   Review of Systems  Constitutional: Negative for chills, fever and weight loss.  HENT: Negative for ear discharge, ear pain and nosebleeds.   Eyes: Negative for blurred vision, pain and discharge.  Respiratory: Negative for sputum production, shortness of breath, wheezing and stridor.   Cardiovascular: Negative for chest pain, palpitations, orthopnea and PND.  Gastrointestinal: Negative for abdominal pain, diarrhea, nausea and vomiting.  Genitourinary: Negative for frequency and urgency.  Musculoskeletal: Negative for back pain and joint pain.  Neurological: Positive for weakness. Negative for sensory change, speech change and focal weakness.  Psychiatric/Behavioral: Negative for depression and hallucinations. The patient is not nervous/anxious.    Tolerating Diet: Tolerating PT:   DRUG ALLERGIES:  No Known Allergies  VITALS:  Blood pressure 138/77, pulse 98, temperature 98.6 F (37 C), temperature source Oral, resp. rate 18, height 6' (1.829 m), weight 88.1 kg (194 lb 3.2 oz), SpO2 92 %.  PHYSICAL EXAMINATION:   Physical Exam  GENERAL:  61 y.o.-year-old patient lying in the bed with no acute distress. chronically ill EYES: Pupils equal, round, reactive to light and accommodation. No scleral icterus. Extraocular muscles intact.  HEENT: Head atraumatic, normocephalic. Oropharynx and nasopharynx clear.  NECK:  Supple, no jugular venous distention. No thyroid enlargement, no tenderness.  LUNGS: Normal breath sounds bilaterally, no wheezing, rales, rhonchi. No use of accessory muscles of respiration.  CARDIOVASCULAR: S1, S2 normal. No murmurs, rubs, or gallops.  ABDOMEN: Soft, nontender, nondistended. Bowel sounds present. No organomegaly or mass.  EXTREMITIES: No cyanosis, clubbing or  edema b/l.    NEUROLOGIC: Cranial nerves II through XII are intact. No focal Motor or sensory deficits b/l.   PSYCHIATRIC:  patient is alert and oriented x 3.  SKIN: No obvious rash, lesion, or ulcer. Few bruises  LABORATORY PANEL:  CBC  Recent Labs Lab 05/21/16 0525  WBC 7.6  HGB 15.1  HCT 44.4  PLT 63*    Chemistries   Recent Labs Lab 05/19/16 1401  05/21/16 0525  NA 122*  < > 126*  K 4.5  < > 3.9  CL 94*  < > 98*  CO2 20*  < > 23  GLUCOSE 191*  < > 124*  BUN 32*  < > 32*  CREATININE 1.24  < > 1.20  CALCIUM 8.2*  < > 7.7*  MG 2.1  --   --   AST 51*  --   --   ALT 24  --   --   ALKPHOS 90  --   --   BILITOT 4.0*  --   --   < > = values in this interval not displayed. Cardiac Enzymes No results for input(s): TROPONINI in the last 168 hours. RADIOLOGY:  Ct Abdomen Pelvis W Contrast  Result Date: 05/19/2016 CLINICAL DATA:  Right upper quadrant abdominal pain, vomiting EXAM: CT ABDOMEN AND PELVIS WITH CONTRAST TECHNIQUE: Multidetector CT imaging of the abdomen and pelvis was performed using the standard protocol following bolus administration of intravenous contrast. CONTRAST:  116mL ISOVUE-300 IOPAMIDOL (ISOVUE-300) INJECTION 61% COMPARISON:  Right upper quadrant ultrasound dated 05/19/2016 FINDINGS: Lower chest: Mild dependent atelectasis in the right lower lobe. Three vessel coronary atherosclerosis. Hepatobiliary: Cirrhosis. Infiltrating lesion centered in segment 7, measuring approximately 8.0 x 9.4 cm (series 2/image 24), highly suspicious for  infiltrating HCC with associated portal vein thrombus (described below). Some degree of superimposed perfusion anomaly is also possible. Additional 4.5 x 5.2 cm lesion in segment 4A (series 2/image 12), also highly suspicious for HCC. 2.5 cm subcapsular lesion in segment 8 (series 2/image 16), indeterminate. Gallbladder is unremarkable. No intrahepatic or extrahepatic duct dilatation. Pancreas: Within normal limits. Spleen:  Splenomegaly, measuring 16.6 cm in craniocaudal dimension. Adrenals/Urinary Tract: Adrenal glands are within normal limits. Kidneys are unremarkable. No intrahepatic or extrahepatic ductal dilatation. Mildly thick-walled bladder. Trace nondependent gas (series 2/ image 84). Stomach/Bowel: Stomach is within normal limits. No evidence of bowel obstruction. Appendix is not discretely visualized. Extensive sigmoid diverticulosis, without evidence of diverticulitis. Vascular/Lymphatic: No evidence of abdominal aortic aneurysm. Atherosclerotic calcifications of the abdominal aorta and branch vessels. Gastroesophageal varices.  Recanalized periumbilical vein. Occlusion of the posterior branch of the right portal vein (series 2/image 20), tumor thrombus not excluded. Reproductive: Prostate is grossly unremarkable. Other: Trace pelvic ascites. Musculoskeletal: Degenerative changes of the visualized thoracolumbar spine. IMPRESSION: Cirrhosis. Findings suspicious for multifocal HCC, as described above, including an 8.0 x 9.4 cm infiltrating lesion in segment 7 and a 5.2 cm lesion in segment 4A. Occlusion of the posterior branch of the right portal vein, tumor thrombus not excluded. Stigmata of portal hypertension, including splenomegaly, gastroesophageal varices, and recanalized periumbilical vein. Electronically Signed   By: Julian Hy M.D.   On: 05/19/2016 19:36   US Renal  Result Date: 05/20/2016 CLINICAL DATA:  UTI EXAM: RENAL / URINARY TRACT ULTRASOUND COMPLETE COMPARISON:  CT 05/19/2016 FINDINGS: Right Kidney: Length: 13.0 cm. Echogenicity within normal limits. No mass or hydronephrosis visualized. Left Kidney: Length: 13.2 cm. Echogenicity within normal limits. No mass or hydronephrosis visualized. Bladder: Appears normal for degree of bladder distention. 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 IMPRESSION: No acute renal abnormality.  No hydronephrosis.  Electronically Signed   By: Rolm Baptise M.D.   On: 05/20/2016 10:00   Dg Chest Port 1 View  Result Date: 05/20/2016 CLINICAL DATA:  Acute onset of tachypnea.  Initial encounter. EXAM: PORTABLE CHEST 1 VIEW COMPARISON:  Chest radiograph performed 05/19/2016 FINDINGS: Patchy bibasilar airspace opacities may reflect pneumonia. No pleural effusion or pneumothorax is seen. The cardiomediastinal silhouette is normal in size. No acute osseous abnormalities are seen. There is chronic deformity of the right clavicle. IMPRESSION: Patchy bibasilar airspace opacities may reflect pneumonia. Electronically Signed   By: Garald Balding M.D.   On: 05/20/2016 01:24   Dg Abdomen Acute W/chest  Result Date: 05/19/2016 CLINICAL DATA:  Vomiting and fever, 3 days duration. EXAM: DG ABDOMEN ACUTE W/ 1V CHEST COMPARISON:  None. FINDINGS: Bowel gas pattern shows gas in small and large bowel but no abnormally dilated loops. Small bowel loops in the upper abdomen are slightly prominent, as can be seen with abnormal processes such as pancreatitis or early/ partial small bowel obstruction. No free air. No abnormal calcifications. Chronic degenerative changes affect spine. One-view chest shows normal heart and mediastinal contours except for tortuosity of the aorta. There is mild chronic scarring at the lung bases. No sign of active infiltrate, effusion or collapse. No free air. IMPRESSION: No specific or definitely pathologic finding. No sign of high-grade obstruction or free air. Slightly prominent loops of upper abdominal small intestine can be seen in abnormal processes such as pancreatitis or early/partial small bowel obstruction. Electronically Signed   By: Nelson Chimes M.D.   On: 05/19/2016 16:07  US Abdomen Limited Ruq  Result Date: 05/19/2016 CLINICAL DATA:  Abdominal pain with vomiting EXAM: US ABDOMEN LIMITED - RIGHT UPPER QUADRANT COMPARISON:  05/19/2016 FINDINGS: Gallbladder: No calcified stones or sludge. Mild diffuse  wall thickening measuring 3.4 mm. Negative sonographic Murphy's. Common bile duct: Diameter: 3.7 mm Liver: Diffuse heterogenous coarse echotexture. Hypoechoic nodules are present, the most prominent in the right lobe is seen anteriorly and measures 0.6 x 0.4 x 0.5 cm. There is an echogenic mass with hypoechoic center within the posterior right lobe measuring 2 x 1.8 x 1 2 cm. IMPRESSION: 1. No calcified gallstones. Nonspecific mild diffuse wall thickening but negative sonographic Murphy. Findings could be secondary to acute or chronic cholecystitis, liver disease, or edema forming states. 2. Diffuse coarse heterogeneous appearance of the liver with multiple nodules. Suggest CT versus nonemergent MRI for further evaluation. Electronically Signed   By: Donavan Foil M.D.   On: 05/19/2016 17:09   ASSESSMENT AND PLAN:   Wrigley Plasencia  is a 61 y.o. male with a known history of Alcohol abuse, hepatitis C, COPD with ongoing tobacco abuse comes to the emergency room after he started having nausea vomiting for last 3 days. Patient has not eaten since Friday.   1.Escherichia coli sepsis due to Escherichia coli UTI -IV meropenem. De-escalate antibiotics and sensitivities obtained.  2. Intractable nausea vomiting could be viral syndrome-resolved -Patient currently dry heaving. No vomiting since morning. Feeling hungry. -Appears clinically dehydrated -IV fluids, IV Zofran when necessary  3. Acute hyponatremia secondary to dehydration from GI losses -IV fluids and monitor sodium  4. Chronic alcoholism last drink was last weekend patient drank beer denies symptoms of DTs -We'll place patient on CIWA protocol -Advised on abstinence  5. Tobacco abuse advised smoking cessation  6. Chronic thrombo-Cytopenia appears alcohol related No active bleeding. Continue to monitor.  7. DVT prophylaxis SCDs avoid antiplatelet secondary to low platelet count  Physical therapy recommended home health  PT.  Case discussed with Care Management/Social Worker. Management plans discussed with the patient, family and they are in agreement.  CODE STATUS: full DVT Prophylaxis: SCD TOTAL TIME TAKING CARE OF THIS PATIENT: 40 minutes.  >50% time spent on counselling and coordination of care  POSSIBLE D/C IN1 DAYS, DEPENDING ON CLINICAL CONDITION.  Note: This dictation was prepared with Dragon dictation along with smaller phrase technology. Any transcriptional errors that result from this process are unintentional.  Khori Rosevear M.D on 05/21/2016 at 3:26 PM  Between 7am to 6pm - Pager - [phone]  After 6pm go to [email or web address] - password [email or web address] [facility]  Office  [phone]  CC: Primary care physician; Dion Body, MD

## 2016-05-21 NOTE — Progress Notes (Signed)
PT Cancellation Note  Patient Details Name: Donald Bowers MRN: [id number] DOB: [birth year]   Cancelled Treatment:    Reason Eval/Treat Not Completed: Patient's level of consciousness. Patient is fast asleep when therapist enters room, did not awake with knocking on door. PT will re-attempt mobility evaluation when patient is more alert and appropriate for session.   Royce Macadamia PT, DPT, CSCS    05/21/2016, 1:25 PM

## 2016-05-21 NOTE — Evaluation (Signed)
Physical Therapy Evaluation Patient Details Name: Donald Bowers MRN: [id number] DOB: [birth year] Today's Date: 05/21/2016   History of Present Illness  Patient is a 61 y/o male that presents with headache, fever, vomiting, and multiple falls from dizziness. CT scan negative for intra-cranial changes, imaging of the abdominal area indicative of pneumonia, R portal vein occlusion. Also noted to have UTI.   Clinical Impression  Patient is initially asleep, awakens when PT knocks on the door. He is slightly groggy throughout session, and reports feeling weak due to lack of appetite and decreased oral intake. He is largely modified independent with bed mobility, though slow. He does require use of UE for balance assistance throughout session, which he states is from feeling weak. He does have chronic R knee and back pain leading to antalgic gait pattern, though this appears to be his baseline. At this point he would benefit from HHPT to address weakness and deconditioning.     Follow Up Recommendations Home health PT    Equipment Recommendations  Rolling walker with 5" wheels    Recommendations for Other Services       Precautions / Restrictions Precautions Precautions: Fall Restrictions Weight Bearing Restrictions: No      Mobility  Bed Mobility Overal bed mobility: Modified Independent             General bed mobility comments: Patient requires prolonged time to enter/exit the bed, but no assistance or cuing required.   Transfers Overall transfer level: Needs assistance Equipment used: None Transfers: Sit to/from Stand Sit to Stand: Supervision         General transfer comment: Patient requires use of UEs to complete transfer, slow to complete but no loss of balance observed.   Ambulation/Gait Ambulation/Gait assistance: Min guard Ambulation Distance (Feet): 200 Feet Assistive device:  (IV pole ) Gait Pattern/deviations: Decreased step length - right;Decreased  step length - left;Wide base of support;Antalgic   Gait velocity interpretation: <1.8 ft/sec, indicative of risk for recurrent falls General Gait Details: Patient demonstrates antalgic gait pattern (decreased R knee extension at heel strike, decreased stance time) which he attributes to chronic R knee pain. He is able to ambulate without HHA, however step length drops dramatically, he declines attempting ambulation with RW  Stairs            Wheelchair Mobility    Modified Rankin (Stroke Patients Only)       Balance Overall balance assessment: Needs assistance Sitting-balance support: No upper extremity supported;Feet supported Sitting balance-Leahy Scale: Good     Standing balance support: Single extremity supported Standing balance-Leahy Scale: Fair                               Pertinent Vitals/Pain Pain Assessment:  (Patient reports chronic R knee, lumbar pain though does not describe intensity)    Home Living Family/patient expects to be discharged to:: Private residence Living Arrangements: Children Available Help at Discharge: Family;Available PRN/intermittently Type of Home: House Home Access: Stairs to enter   [employer] of Steps: 5 (did not specify if railing is present).  Home Layout: One level Home Equipment: None      Prior Function Level of Independence: Independent         Comments: Patient reports he drives, still works, denies falls other than the few he had the day before admission.      Hand Dominance        Extremity/Trunk Assessment  Upper Extremity Assessment Upper Extremity Assessment: Overall WFL for tasks assessed    Lower Extremity Assessment Lower Extremity Assessment: Overall WFL for tasks assessed       Communication   Communication: No difficulties  Cognition Arousal/Alertness: Awake/alert Behavior During Therapy: WFL for tasks assessed/performed;Flat affect Overall Cognitive Status: Within  Functional Limits for tasks assessed                      General Comments General comments (skin integrity, edema, etc.): Lower left leg has discoloration, consistent with scarring    Exercises     Assessment/Plan    PT Assessment Patient needs continued PT services  PT Problem List Decreased strength;Decreased activity tolerance;Decreased balance;Decreased knowledge of use of DME;Decreased mobility       PT Treatment Interventions DME instruction;Therapeutic activities;Therapeutic exercise;Gait training;Stair training;Balance training;Functional mobility training;Neuromuscular re-education    PT Goals (Current goals can be found in the Care Plan section)  Acute Rehab PT Goals Patient Stated Goal: To return home  PT Goal Formulation: With patient Time For Goal Achievement: 06/04/16 Potential to Achieve Goals: Good    Frequency Min 2X/week   Barriers to discharge        Co-evaluation               End of Session Equipment Utilized During Treatment: Gait belt Activity Tolerance: Patient tolerated treatment well Patient left: in bed;with bed alarm set;with call bell/phone within reach Nurse Communication: Mobility status PT Visit Diagnosis: Unsteadiness on feet (R26.81);Muscle weakness (generalized) (M62.81)         Time: 3151-7616 PT Time Calculation (min) (ACUTE ONLY): 12 min   Charges:   PT Evaluation $PT Eval Moderate Complexity: 1 Procedure     PT G Codes:       Royce Macadamia PT, DPT, CSCS    05/21/2016, 2:29 PM

## 2016-05-22 LAB — URINE CULTURE: Culture: >=100000 — AB

## 2016-05-22 LAB — BASIC METABOLIC PANEL
ANION GAP: 6 (ref 5–15)
BUN: 22 mg/dL — ABNORMAL HIGH (ref 6–20)
CHLORIDE: 102 mmol/L (ref 101–111)
CO2: 22 mmol/L (ref 22–32)
Calcium: 7.7 mg/dL — ABNORMAL LOW (ref 8.9–10.3)
Creatinine, Ser: 0.74 mg/dL (ref 0.61–1.24)
GFR calc non Af Amer: >60 mL/min (ref >60)
Glucose, Bld: 123 mg/dL — ABNORMAL HIGH (ref 65–99)
POTASSIUM: 3.5 mmol/L (ref 3.5–5.1)
SODIUM: 130 mmol/L — AB (ref 135–145)

## 2016-05-22 LAB — GLUCOSE, CAPILLARY
GLUCOSE-CAPILLARY: 102 mg/dL — AB (ref 65–99)
Glucose-Capillary: 107 mg/dL — ABNORMAL HIGH (ref 65–99)

## 2016-05-22 MED ORDER — FOLIC ACID 1 MG PO TABS: 1.0000 mg | ORAL_TABLET | Freq: Every day | ORAL | 1 refills | Status: DC

## 2016-05-22 MED ORDER — BOOST / RESOURCE BREEZE PO LIQD: 1.0000 | Freq: Three times a day (TID) | ORAL | 0 refills | Status: DC

## 2016-05-22 MED ORDER — AMOXICILLIN-POT CLAVULANATE 875-125 MG PO TABS: 1.0000 | ORAL_TABLET | Freq: Two times a day (BID) | ORAL | 0 refills | Status: AC

## 2016-05-22 MED ORDER — THIAMINE HCL 50 MG PO TABS: 50.0000 mg | ORAL_TABLET | Freq: Every day | ORAL | 0 refills | Status: DC

## 2016-05-22 MED ORDER — ADULT MULTIVITAMIN W/MINERALS CH: 1.0000 | ORAL_TABLET | Freq: Every day | ORAL | 0 refills | Status: DC

## 2016-05-22 MED ORDER — AMOXICILLIN-POT CLAVULANATE 875-125 MG PO TABS
1.0000 | ORAL_TABLET | Freq: Two times a day (BID) | ORAL | Status: DC
  Administered 2016-05-22: 1 via ORAL
  Filled 2016-05-22: qty 1

## 2016-05-22 NOTE — Progress Notes (Signed)
Reviewed d/c instructions and prescription with patient and answered any questions and gave patient a note for work. Waiting for his son to pick him up.

## 2016-05-22 NOTE — Care Management (Signed)
Admitted to this facility with the diagnosis of hyponatremia (CIWA). Son Cailen Mihalik lives in the home. Sister is Charlotta Newton [phone]). Last seen Dr. Netty Starring January 2018. Prescriptions are filled at [facility]. Takes care of all basic activities of daily living, drives. No home health. No skilled facility. No home oxygen. Uses no equipment to aid in ambulation. Good appetite. Fell last Monday. Works full time at [employer]. Son will transport. Physical therapy evaluation completed. Recommends home with home health and therapy. Mr. Tangen works full time and is not home bound. Discharge to home today per Dr. Reginia Forts RN MSN CCM Care Management

## 2016-05-22 NOTE — Discharge Summary (Signed)
[facility] NAME: Donald Bowers    MR#:  [id number]  DATE OF BIRTH:  Jul 21, 1955  DATE OF ADMISSION:  05/19/2016 ADMITTING PHYSICIAN: Fritzi Mandes, MD  DATE OF DISCHARGE: 05/22/16  PRIMARY CARE PHYSICIAN: Dion Body, MD    ADMISSION DIAGNOSIS:  Abdominal pain, vomiting, and diarrhea [R10.9, R11.10, R19.7]  DISCHARGE DIAGNOSIS:  Ecoli Sepsis/UTI Chronic alchohol use Hyponatremia improving  SECONDARY DIAGNOSIS:   Past Medical History:  Diagnosis Date  . COPD (chronic obstructive pulmonary disease) ([place])   . Hepatitis C     HOSPITAL COURSE:   Donald Bowers a 61 y.o. malewith a known history of Alcohol abuse, hepatitis C, COPD with ongoing tobacco abuse comes to the emergency room after he started having nausea vomiting for last 3 days. Patient has not eaten since Friday.   1.Escherichia coli sepsis due to Escherichia coli UTI -IV meropenem. De-escalate antibiotics and sensitivities obtained. -change to augmenting per labs prelim results  2. Intractable nausea vomiting could be viral syndrome-resolved -Patient currently dry heaving. No vomiting since morning. Advance to soft diet -received IV fluids, IV Zofran when necessary  3. Acute hyponatremia secondary to dehydration from GI losses -IV fluids and monitor sodium -123---130  4. Chronic alcoholism last drink was last weekend patient drank beer denies symptoms of DTs -We'll place patient on CIWA protocol---not in WD -Advised on abstinence  5. Tobacco abuse advised smoking cessation  6. Chronic thrombo-Cytopenia appears alcohol related No active bleeding. Continue to monitor.  7. DVT prophylaxis SCDs avoid antiplatelet secondary to low platelet count  Physical therapy recommended home health PT.  D/c home today CONSULTS OBTAINED:    DRUG ALLERGIES:  No Known Allergies  DISCHARGE MEDICATIONS:   Current Discharge Medication  List    START taking these medications   Details  amoxicillin-clavulanate (AUGMENTIN) 875-125 MG tablet Take 1 tablet by mouth every 12 (twelve) hours. Qty: 14 tablet, Refills: 0    feeding supplement (BOOST / RESOURCE BREEZE) LIQD Take 1 Container by mouth 3 (three) times daily between meals. Qty: 30 Container, Refills: 0    folic acid (FOLVITE) 1 MG tablet Take 1 tablet (1 mg total) by mouth daily. Qty: 30 tablet, Refills: 1    Multiple Vitamin (MULTIVITAMIN WITH MINERALS) TABS tablet Take 1 tablet by mouth daily. Qty: 30 tablet, Refills: 0    thiamine 50 MG tablet Take 1 tablet (50 mg total) by mouth daily. Qty: 30 tablet, Refills: 0      CONTINUE these medications which have NOT CHANGED   Details  albuterol (PROVENTIL HFA;VENTOLIN HFA) 108 (90 Base) MCG/ACT inhaler Inhale 2 puffs into the lungs every 6 (six) hours as needed for wheezing or shortness of breath. Qty: 1 Inhaler, Refills: 2      STOP taking these medications     doxycycline (MONODOX) 100 MG capsule      oxyCODONE-acetaminophen (PERCOCET) 7.5-325 MG tablet      predniSONE (DELTASONE) 20 MG tablet         If you experience worsening of your admission symptoms, develop shortness of breath, life threatening emergency, suicidal or homicidal thoughts you must seek medical attention immediately by calling 911 or calling your MD immediately  if symptoms less severe.  You Must read complete instructions/literature along with all the possible adverse reactions/side effects for all the Medicines you take and that have been prescribed to you. Take any new Medicines after you have completely understood and accept all the possible  adverse reactions/side effects.   Please note  You were cared for by a hospitalist during your hospital stay. If you have any questions about your discharge medications or the care you received while you were in the hospital after you are discharged, you can call the unit and asked to speak  with the hospitalist on call if the hospitalist that took care of you is not available. Once you are discharged, your primary care physician will handle any further medical issues. Please note that NO REFILLS for any discharge medications will be authorized once you are discharged, as it is imperative that you return to your primary care physician (or establish a relationship with a primary care physician if you do not have one) for your aftercare needs so that they can reassess your need for medications and monitor your lab values. Today   SUBJECTIVE   headache  VITAL SIGNS:  Blood pressure 133/79, pulse 90, temperature 98.4 F (36.9 C), resp. rate 20, height 6' (1.829 m), weight 88.1 kg (194 lb 3.2 oz), SpO2 96 %.  I/O:   Intake/Output Summary (Last 24 hours) at 05/22/16 0929 Last data filed at 05/22/16 3845  Gross per 24 hour  Intake             1520 ml  Output             1630 ml  Net             -110 ml    PHYSICAL EXAMINATION:  GENERAL:  61 y.o.-year-old patient lying in the bed with no acute distress. Chronically ill EYES: Pupils equal, round, reactive to light and accommodation. No scleral icterus. Extraocular muscles intact.  HEENT: Head atraumatic, normocephalic. Oropharynx and nasopharynx clear.  NECK:  Supple, no jugular venous distention. No thyroid enlargement, no tenderness.  LUNGS: Normal breath sounds bilaterally, no wheezing, rales,rhonchi or crepitation. No use of accessory muscles of respiration.  CARDIOVASCULAR: S1, S2 normal. No murmurs, rubs, or gallops.  ABDOMEN: Soft, non-tender, non-distended. Bowel sounds present. No organomegaly or mass.  EXTREMITIES: No pedal edema, cyanosis, or clubbing.  NEUROLOGIC: Cranial nerves II through XII are intact. Muscle strength 5/5 in all extremities. Sensation intact. Gait not checked.  PSYCHIATRIC: The patient is alert and oriented x 3.  SKIN: No obvious rash, lesion, or ulcer.   DATA REVIEW:   CBC   Recent Labs Lab  05/21/16 0525  WBC 7.6  HGB 15.1  HCT 44.4  PLT 63*    Chemistries   Recent Labs Lab 05/19/16 1401  05/22/16 0631  NA 122*  < > 130*  K 4.5  < > 3.5  CL 94*  < > 102  CO2 20*  < > 22  GLUCOSE 191*  < > 123*  BUN 32*  < > 22*  CREATININE 1.24  < > 0.74  CALCIUM 8.2*  < > 7.7*  MG 2.1  --   --   AST 51*  --   --   ALT 24  --   --   ALKPHOS 90  --   --   BILITOT 4.0*  --   --   < > = values in this interval not displayed.  Microbiology Results   Recent Results (from the past 240 hour(s))  Urine culture     Status: Abnormal (Preliminary result)   Collection Time: 05/19/16  6:08 PM  Result Value Ref Range Status   Specimen Description URINE, CLEAN CATCH  Final   Special Requests NONE  Final   Culture >=100,000 COLONIES/mL ESCHERICHIA COLI (A)  Final   Report Status PENDING  Incomplete  CULTURE, BLOOD (ROUTINE X 2) w Reflex to ID Panel     Status: Abnormal (Preliminary result)   Collection Time: 05/20/16  1:10 AM  Result Value Ref Range Status   Specimen Description BLOOD LEFT ARM  Final   Special Requests BOTTLES DRAWN AEROBIC AND ANAEROBIC BCAV  Final   Culture  Setup Time   Final    Organism ID to follow GRAM NEGATIVE RODS IN BOTH AEROBIC AND ANAEROBIC BOTTLES CRITICAL RESULT CALLED TO, READ BACK BY AND VERIFIED WITH: KAREN HAYES AT 1502 05/20/16 SDR    Culture (A)  Final    ESCHERICHIA COLI repeating susceptibilities Performed at [facility], [place] [address] [place], [place] [address]    Report Status PENDING  Incomplete  CULTURE, BLOOD (ROUTINE X 2) w Reflex to ID Panel     Status: Abnormal (Preliminary result)   Collection Time: 05/20/16  1:10 AM  Result Value Ref Range Status   Specimen Description BLOOD LEFT HAND  Final   Special Requests BOTTLES DRAWN AEROBIC AND ANAEROBIC  BCAV  Final   Culture  Setup Time   Final    GRAM NEGATIVE RODS IN BOTH AEROBIC AND ANAEROBIC BOTTLES CRITICAL VALUE NOTED.  VALUE IS CONSISTENT WITH PREVIOUSLY REPORTED  AND CALLED VALUE.    Culture ESCHERICHIA COLI (A)  Final   Report Status PENDING  Incomplete  Blood Culture ID Panel (Reflexed)     Status: Abnormal   Collection Time: 05/20/16  1:10 AM  Result Value Ref Range Status   Enterococcus species NOT DETECTED NOT DETECTED Final   Listeria monocytogenes NOT DETECTED NOT DETECTED Final   Staphylococcus species NOT DETECTED NOT DETECTED Final   Staphylococcus aureus NOT DETECTED NOT DETECTED Final   Streptococcus species NOT DETECTED NOT DETECTED Final   Streptococcus agalactiae NOT DETECTED NOT DETECTED Final   Streptococcus pneumoniae NOT DETECTED NOT DETECTED Final   Streptococcus pyogenes NOT DETECTED NOT DETECTED Final   Acinetobacter baumannii NOT DETECTED NOT DETECTED Final   Enterobacteriaceae species DETECTED (A) NOT DETECTED Final    Comment: Enterobacteriaceae represent a large family of gram-negative bacteria, not a single organism. CRITICAL RESULT CALLED TO, READ BACK BY AND VERIFIED WITH:  KAREN HAYES AT 3151 05/20/16 SDR    Enterobacter cloacae complex NOT DETECTED NOT DETECTED Final   Escherichia coli DETECTED (A) NOT DETECTED Final    Comment: CRITICAL RESULT CALLED TO, READ BACK BY AND VERIFIED WITH:  KAREN HAYES AT 1502 05/20/16 SDR    Klebsiella oxytoca NOT DETECTED NOT DETECTED Final   Klebsiella pneumoniae NOT DETECTED NOT DETECTED Final   Proteus species NOT DETECTED NOT DETECTED Final   Serratia marcescens NOT DETECTED NOT DETECTED Final   Carbapenem resistance NOT DETECTED NOT DETECTED Final   Haemophilus influenzae NOT DETECTED NOT DETECTED Final   Neisseria meningitidis NOT DETECTED NOT DETECTED Final   Pseudomonas aeruginosa NOT DETECTED NOT DETECTED Final   Candida albicans NOT DETECTED NOT DETECTED Final   Candida glabrata NOT DETECTED NOT DETECTED Final   Candida krusei NOT DETECTED NOT DETECTED Final   Candida parapsilosis NOT DETECTED NOT DETECTED Final   Candida tropicalis NOT DETECTED NOT DETECTED Final     RADIOLOGY:  US Renal  Result Date: 05/20/2016 CLINICAL DATA:  UTI EXAM: RENAL / URINARY TRACT ULTRASOUND COMPLETE COMPARISON:  CT 05/19/2016 FINDINGS: Right Kidney: Length: 13.0 cm. Echogenicity within normal limits. No  mass or hydronephrosis visualized. Left Kidney: Length: 13.2 cm. Echogenicity within normal limits. No mass or hydronephrosis visualized. Bladder: Appears normal for degree of bladder distention. 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 IMPRESSION: No acute renal abnormality.  No hydronephrosis. Electronically Signed   By: Rolm Baptise M.D.   On: 05/20/2016 10:00     Management plans discussed with the patient, family and they are in agreement.  CODE STATUS:     Code Status Orders        Start     Ordered   05/19/16 2117  Full code  Continuous     05/19/16 2116    Code Status History    Date Active Date Inactive Code Status Order ID Comments User Context   This patient has a current code status but no historical code status.      TOTAL TIME TAKING CARE OF THIS PATIENT: *40 minutes.    Donald Bowers M.D on 05/22/2016 at 9:29 AM  Between 7am to 6pm - Pager - [phone] After 6pm go to [email or web address] - password [email or web address] [facility]  Office  [phone]  CC: Primary care physician; Dion Body, MD

## 2016-05-22 NOTE — Progress Notes (Signed)
Patient d/c with his son--preferred to walk and declines need of wheel chair.

## 2016-05-23 LAB — CULTURE, BLOOD (ROUTINE X 2)

## 2016-06-19 ENCOUNTER — Inpatient Hospital Stay
Admission: EM | Admit: 2016-06-19 | Discharge: 2016-06-24 | DRG: 368 | Disposition: A | Discharge status: Home / Self Care | Payer: 59 | Attending: Internal Medicine | Admitting: Internal Medicine

## 2016-06-19 ENCOUNTER — Encounter: Payer: Self-pay | Admitting: *Deleted

## 2016-06-19 DIAGNOSIS — F17210 Nicotine dependence, cigarettes, uncomplicated: Secondary | ICD-10-CM | POA: Diagnosis present

## 2016-06-19 DIAGNOSIS — R531 Weakness: Secondary | ICD-10-CM | POA: Diagnosis not present

## 2016-06-19 DIAGNOSIS — Z8619 Personal history of other infectious and parasitic diseases: Secondary | ICD-10-CM | POA: Diagnosis not present

## 2016-06-19 DIAGNOSIS — K7031 Alcoholic cirrhosis of liver with ascites: Secondary | ICD-10-CM | POA: Diagnosis present

## 2016-06-19 DIAGNOSIS — B182 Chronic viral hepatitis C: Secondary | ICD-10-CM | POA: Diagnosis present

## 2016-06-19 DIAGNOSIS — N390 Urinary tract infection, site not specified: Secondary | ICD-10-CM | POA: Diagnosis present

## 2016-06-19 DIAGNOSIS — R5383 Other fatigue: Secondary | ICD-10-CM | POA: Diagnosis not present

## 2016-06-19 DIAGNOSIS — D649 Anemia, unspecified: Secondary | ICD-10-CM | POA: Diagnosis not present

## 2016-06-19 DIAGNOSIS — F1020 Alcohol dependence, uncomplicated: Secondary | ICD-10-CM | POA: Diagnosis present

## 2016-06-19 DIAGNOSIS — C220 Liver cell carcinoma: Secondary | ICD-10-CM | POA: Diagnosis present

## 2016-06-19 DIAGNOSIS — K921 Melena: Secondary | ICD-10-CM | POA: Diagnosis present

## 2016-06-19 DIAGNOSIS — K920 Hematemesis: Secondary | ICD-10-CM | POA: Diagnosis present

## 2016-06-19 DIAGNOSIS — K766 Portal hypertension: Secondary | ICD-10-CM | POA: Diagnosis present

## 2016-06-19 DIAGNOSIS — R160 Hepatomegaly, not elsewhere classified: Secondary | ICD-10-CM

## 2016-06-19 DIAGNOSIS — R0600 Dyspnea, unspecified: Secondary | ICD-10-CM

## 2016-06-19 DIAGNOSIS — C787 Secondary malignant neoplasm of liver and intrahepatic bile duct: Secondary | ICD-10-CM | POA: Diagnosis present

## 2016-06-19 DIAGNOSIS — D62 Acute posthemorrhagic anemia: Secondary | ICD-10-CM | POA: Diagnosis present

## 2016-06-19 DIAGNOSIS — R109 Unspecified abdominal pain: Secondary | ICD-10-CM

## 2016-06-19 DIAGNOSIS — K922 Gastrointestinal hemorrhage, unspecified: Secondary | ICD-10-CM | POA: Diagnosis present

## 2016-06-19 DIAGNOSIS — Z79899 Other long term (current) drug therapy: Secondary | ICD-10-CM | POA: Diagnosis not present

## 2016-06-19 DIAGNOSIS — B1920 Unspecified viral hepatitis C without hepatic coma: Secondary | ICD-10-CM | POA: Diagnosis present

## 2016-06-19 DIAGNOSIS — K769 Liver disease, unspecified: Secondary | ICD-10-CM

## 2016-06-19 DIAGNOSIS — K2901 Acute gastritis with bleeding: Secondary | ICD-10-CM | POA: Diagnosis present

## 2016-06-19 DIAGNOSIS — K3189 Other diseases of stomach and duodenum: Secondary | ICD-10-CM | POA: Diagnosis present

## 2016-06-19 DIAGNOSIS — I8500 Esophageal varices without bleeding: Secondary | ICD-10-CM | POA: Diagnosis present

## 2016-06-19 DIAGNOSIS — R188 Other ascites: Secondary | ICD-10-CM

## 2016-06-19 DIAGNOSIS — R1110 Vomiting, unspecified: Secondary | ICD-10-CM | POA: Diagnosis not present

## 2016-06-19 DIAGNOSIS — I81 Portal vein thrombosis: Secondary | ICD-10-CM | POA: Diagnosis not present

## 2016-06-19 DIAGNOSIS — R0689 Other abnormalities of breathing: Secondary | ICD-10-CM

## 2016-06-19 DIAGNOSIS — J449 Chronic obstructive pulmonary disease, unspecified: Secondary | ICD-10-CM | POA: Diagnosis not present

## 2016-06-19 HISTORY — DX: Alcohol abuse, uncomplicated: F10.10

## 2016-06-19 HISTORY — DX: Cocaine abuse, uncomplicated: F14.10

## 2016-06-19 HISTORY — DX: Gastrointestinal hemorrhage, unspecified: K92.2

## 2016-06-19 LAB — COMPREHENSIVE METABOLIC PANEL
ALK PHOS: 88 U/L (ref 38–126)
ALT: 29 U/L (ref 17–63)
AST: 78 U/L — AB (ref 15–41)
Albumin: 2.1 g/dL — ABNORMAL LOW (ref 3.5–5.0)
Anion gap: 6 (ref 5–15)
BILIRUBIN TOTAL: 3.8 mg/dL — AB (ref 0.3–1.2)
BUN: 48 mg/dL — AB (ref 6–20)
CALCIUM: 8 mg/dL — AB (ref 8.9–10.3)
CO2: 30 mmol/L (ref 22–32)
CREATININE: 1.04 mg/dL (ref 0.61–1.24)
Chloride: 98 mmol/L — ABNORMAL LOW (ref 101–111)
GFR calc Af Amer: >60 mL/min (ref >60)
GLUCOSE: 246 mg/dL — AB (ref 65–99)
Potassium: 3.6 mmol/L (ref 3.5–5.1)
Sodium: 134 mmol/L — ABNORMAL LOW (ref 135–145)
Total Protein: 6.8 g/dL (ref 6.5–8.1)

## 2016-06-19 LAB — CBC
HEMATOCRIT: 34.9 % — AB (ref 40.0–52.0)
Hemoglobin: 11.2 g/dL — ABNORMAL LOW (ref 13.0–18.0)
MCH: 27.6 pg (ref 26.0–34.0)
MCHC: 32.1 g/dL (ref 32.0–36.0)
MCV: 86.2 fL (ref 80.0–100.0)
PLATELETS: 310 10*3/uL (ref 150–440)
RBC: 4.05 MIL/uL — ABNORMAL LOW (ref 4.40–5.90)
RDW: 22 % — AB (ref 11.5–14.5)
WBC: 13.9 10*3/uL — ABNORMAL HIGH (ref 3.8–10.6)

## 2016-06-19 LAB — ETHANOL

## 2016-06-19 LAB — LIPASE, BLOOD: Lipase: 20 U/L (ref 11–51)

## 2016-06-19 LAB — TYPE AND SCREEN
ABO/RH(D): O POS
Antibody Screen: NEGATIVE

## 2016-06-19 MED ORDER — ONDANSETRON HCL 4 MG/2ML IJ SOLN
4.0000 mg | Freq: Once | INTRAMUSCULAR | Status: AC
  Administered 2016-06-19: 4 mg via INTRAVENOUS
  Filled 2016-06-19: qty 2

## 2016-06-19 MED ORDER — SODIUM CHLORIDE 0.9 % IV SOLN
25.0000 ug/h | INTRAVENOUS | Status: DC
  Administered 2016-06-20 – 2016-06-22 (×5): 50 ug/h via INTRAVENOUS
  Filled 2016-06-19 (×11): qty 1

## 2016-06-19 MED ORDER — SODIUM CHLORIDE 0.9 % IV BOLUS (SEPSIS)
1000.0000 mL | Freq: Once | INTRAVENOUS | Status: AC
  Administered 2016-06-19: 1000 mL via INTRAVENOUS

## 2016-06-19 MED ORDER — SODIUM CHLORIDE 0.9 % IV SOLN
80.0000 mg | Freq: Once | INTRAVENOUS | Status: AC
  Administered 2016-06-19: 80 mg via INTRAVENOUS
  Filled 2016-06-19: qty 80

## 2016-06-19 MED ORDER — SODIUM CHLORIDE 0.9 % IV SOLN
8.0000 mg/h | INTRAVENOUS | Status: DC
  Administered 2016-06-19 – 2016-06-21 (×4): 8 mg/h via INTRAVENOUS
  Filled 2016-06-19 (×4): qty 80

## 2016-06-19 MED ORDER — DIPHENHYDRAMINE HCL 50 MG/ML IJ SOLN
25.0000 mg | Freq: Once | INTRAMUSCULAR | Status: AC
  Administered 2016-06-19: 25 mg via INTRAVENOUS
  Filled 2016-06-19: qty 1

## 2016-06-19 NOTE — H&P (Signed)
[facility] [place] NAME: Donald Bowers    MR#:  [id number]  DATE OF BIRTH:  16-May-1955  DATE OF ADMISSION:  06/19/2016  PRIMARY CARE PHYSICIAN: Dion Body, MD   REQUESTING/REFERRING PHYSICIAN: Kerman Passey, MD  CHIEF COMPLAINT:   Chief Complaint  Patient presents with  . Emesis    HISTORY OF PRESENT ILLNESS:  Donald Bowers  is a 61 y.o. male who presents with Hematemesis and melena for the last 18-24 hours. He denies any abdominal pain. Patient states that he was supposed to be getting a GI consult from his outpatient primary care physician, though this is more to workup his hepatitis C and a "spot" that they found his liver. On evaluation here the patient's hemoglobin has dropped from 15 to 11. Hospitalists were called for admission and further treatment  PAST MEDICAL HISTORY:   Past Medical History:  Diagnosis Date  . COPD (chronic obstructive pulmonary disease) ([place])   . Hepatitis C     PAST SURGICAL HISTORY:   Past Surgical History:  Procedure Laterality Date  . CARDIAC CATHETERIZATION      SOCIAL HISTORY:   Social History  Substance Use Topics  . Smoking status: Current Every Day Smoker    Packs/day: 0.50    Types: Cigarettes  . Smokeless tobacco: Never Used     Comment: pt said he has all the information he needs   . Alcohol use 7.2 oz/week    12 Cans of beer per week     Comment: weekly/daily    FAMILY HISTORY:   Family History  Problem Relation Age of Onset  . COPD Mother   . Diabetes Sister     DRUG ALLERGIES:  No Known Allergies  MEDICATIONS AT HOME:   Prior to Admission medications   Medication Sig Start Date End Date Taking? Authorizing Provider  albuterol (PROVENTIL HFA;VENTOLIN HFA) 108 (90 Base) MCG/ACT inhaler Inhale 2 puffs into the lungs every 6 (six) hours as needed for wheezing or shortness of breath. 09/21/15   Rudene Re, MD  feeding supplement (BOOST /  RESOURCE BREEZE) LIQD Take 1 Container by mouth 3 (three) times daily between meals. 05/22/16   Fritzi Mandes, MD  folic acid (FOLVITE) 1 MG tablet Take 1 tablet (1 mg total) by mouth daily. 05/22/16   Fritzi Mandes, MD  Multiple Vitamin (MULTIVITAMIN WITH MINERALS) TABS tablet Take 1 tablet by mouth daily. 05/22/16   Fritzi Mandes, MD  thiamine 50 MG tablet Take 1 tablet (50 mg total) by mouth daily. 05/22/16   Fritzi Mandes, MD    REVIEW OF SYSTEMS:  Review of Systems  Constitutional: Negative for chills, fever, malaise/fatigue and weight loss.  HENT: Negative for ear pain, hearing loss and tinnitus.   Eyes: Negative for blurred vision, double vision, pain and redness.  Respiratory: Negative for cough, hemoptysis and shortness of breath.   Cardiovascular: Negative for chest pain, palpitations, orthopnea and leg swelling.  Gastrointestinal: Positive for melena and vomiting. Negative for abdominal pain, constipation, diarrhea and nausea.  Genitourinary: Negative for dysuria, frequency and hematuria.  Musculoskeletal: Negative for back pain, joint pain and neck pain.  Skin:       No acne, rash, or lesions  Neurological: Negative for dizziness, tremors, focal weakness and weakness.  Endo/Heme/Allergies: Negative for polydipsia. Does not bruise/bleed easily.  Psychiatric/Behavioral: Negative for depression. The patient is not nervous/anxious and does not have insomnia.      VITAL SIGNS:   Vitals:  06/19/16 1718 06/19/16 2047 06/19/16 2052  BP: 121/82 118/69   Pulse: (!) 120  (!) 104  Resp: 18  20  Temp: 97.5 F (36.4 C)    TempSrc: Oral    SpO2: 100%  100%  Weight: 87.1 kg (192 lb)    Height: 6' (1.829 m)     Wt Readings from Last 3 Encounters:  06/19/16 87.1 kg (192 lb)  05/20/16 88.1 kg (194 lb 3.2 oz)  10/23/15 93 kg (205 lb)    PHYSICAL EXAMINATION:  Physical Exam  Vitals reviewed. Constitutional: He is oriented to person, place, and time. He appears well-developed and well-nourished.  No distress.  HENT:  Head: Normocephalic and atraumatic.  Mouth/Throat: Oropharynx is clear and moist.  Eyes: Conjunctivae and EOM are normal. Pupils are equal, round, and reactive to light. No scleral icterus.  Neck: Normal range of motion. Neck supple. No JVD present. No thyromegaly present.  Cardiovascular: Normal rate, regular rhythm and intact distal pulses.  Exam reveals no gallop and no friction rub.   No murmur heard. Respiratory: Effort normal and breath sounds normal. No respiratory distress. He has no wheezes. He has no rales.  GI: Soft. Bowel sounds are normal. He exhibits no distension. There is no tenderness.  Enlarged liver palpable, and mildly tender  Musculoskeletal: Normal range of motion. He exhibits no edema.  No arthritis, no gout  Lymphadenopathy:    He has no cervical adenopathy.  Neurological: He is alert and oriented to person, place, and time. No cranial nerve deficit.  No dysarthria, no aphasia  Skin: Skin is warm and dry. No rash noted. No erythema.  Psychiatric: He has a normal mood and affect. His behavior is normal. Judgment and thought content normal.    LABORATORY PANEL:   CBC  Recent Labs Lab 06/19/16 1716  WBC 13.9*  HGB 11.2*  HCT 34.9*  PLT 310   ------------------------------------------------------------------------------------------------------------------  Chemistries   Recent Labs Lab 06/19/16 1716  NA 134*  K 3.6  CL 98*  CO2 30  GLUCOSE 246*  BUN 48*  CREATININE 1.04  CALCIUM 8.0*  AST 78*  ALT 29  ALKPHOS 88  BILITOT 3.8*   ------------------------------------------------------------------------------------------------------------------  Cardiac Enzymes No results for input(s): TROPONINI in the last 168 hours. ------------------------------------------------------------------------------------------------------------------  RADIOLOGY:  No results found.  EKG:   Orders placed or performed during the hospital  encounter of 06/19/16  . EKG 12-Lead  . EKG 12-Lead    IMPRESSION AND PLAN:  Principal Problem:   Hematemesis - suspect upper GI bleed, possibly variceal, the patient has no prior diagnosis of esophageal or gastric varices. PPI drip started, octreotide drip started, GI consult Active Problems:   Melena - patient started with hematemesis first, then later developed melena, still suspect this is from upper GI source. Treatment as above.   COPD (chronic obstructive pulmonary disease) ([place]) - continue home when necessary inhaler   Hepatitis C - GI consult above for the same, has no prior diagnosis of cirrhosis, though he certainly does need workup for this and for the "spot" that was found on his liver.  All the records are reviewed and case discussed with ED provider. Management plans discussed with the patient and/or family.  DVT PROPHYLAXIS: Mechanical only  GI PROPHYLAXIS: PPI  ADMISSION STATUS: Inpatient  CODE STATUS: Full Code Status History    Date Active Date Inactive Code Status Order ID Comments User Context   05/19/2016  9:16 PM 05/22/2016  3:03 PM Full Code [id number]  Sona  Posey Pronto, MD Inpatient      TOTAL TIME TAKING CARE OF THIS PATIENT: 45 minutes.   Lahna Nath [place] 06/19/2016, 9:46 PM  [facility]  Office  [phone]  CC: Primary care physician; Dion Body, MD  Note:  This document was prepared using Dragon voice recognition software and may include unintentional dictation errors.

## 2016-06-19 NOTE — ED Provider Notes (Signed)
[facility] Emergency Department Provider Note  Time seen: 8:57 PM  I have reviewed the triage vital signs and the nursing notes.   HISTORY  Chief Complaint Emesis    HPI Donald NICKOLSON is a 61 y.o. male with a past medical history of COPD, hepatitis C, ethanol abuse, who presents to the emergency department with nausea and vomiting. According to the patient he was admitted to the hospital last month for nausea and vomiting found to have a urinary tract infection which is spread to his bloodstream, patient ultimately discharged on Augmentin. Patient states he completed a course of Augmentin. Was feeling much better at the vomiting went away until Wednesday (yesterday) patient once again began with nausea and vomiting and loose stool. Patient states the vomit and stool have been black in color. States intermittent abdominal pain denies any currently. Denies any fever. Denies dysuria but states his urine has been dark.  Past Medical History:  Diagnosis Date  . COPD (chronic obstructive pulmonary disease) ([place])   . Hepatitis C     Patient Active Problem List   Diagnosis Date Noted  . Hyponatremia 05/19/2016    Past Surgical History:  Procedure Laterality Date  . CARDIAC CATHETERIZATION      Prior to Admission medications   Medication Sig Start Date End Date Taking? Authorizing Provider  albuterol (PROVENTIL HFA;VENTOLIN HFA) 108 (90 Base) MCG/ACT inhaler Inhale 2 puffs into the lungs every 6 (six) hours as needed for wheezing or shortness of breath. 09/21/15   Rudene Re, MD  feeding supplement (BOOST / RESOURCE BREEZE) LIQD Take 1 Container by mouth 3 (three) times daily between meals. 05/22/16   Fritzi Mandes, MD  folic acid (FOLVITE) 1 MG tablet Take 1 tablet (1 mg total) by mouth daily. 05/22/16   Fritzi Mandes, MD  Multiple Vitamin (MULTIVITAMIN WITH MINERALS) TABS tablet Take 1 tablet by mouth daily. 05/22/16   Fritzi Mandes, MD  thiamine 50 MG tablet  Take 1 tablet (50 mg total) by mouth daily. 05/22/16   Fritzi Mandes, MD    No Known Allergies  History reviewed. No pertinent family history.  Social History Social History  Substance Use Topics  . Smoking status: Current Every Day Smoker    Packs/day: 0.50    Types: Cigarettes  . Smokeless tobacco: Never Used     Comment: pt said he has all the information he needs   . Alcohol use 7.2 oz/week    12 Cans of beer per week     Comment: weekly/daily    Review of Systems Constitutional: Negative for fever. Cardiovascular: Negative for chest pain. Respiratory: Negative for shortness of breath. Gastrointestinal: Intermittent abdominal pain, none currently. Positive for nausea vomiting diarrhea which have been black in color. Genitourinary: Negative for dysuria. Neurological: Negative for headache 10-point ROS otherwise negative.  ____________________________________________   PHYSICAL EXAM:  VITAL SIGNS: ED Triage Vitals  Enc Vitals Group     BP 06/19/16 1718 121/82     Pulse Rate 06/19/16 1718 (!) 120     Resp 06/19/16 1718 18     Temp 06/19/16 1718 97.5 F (36.4 C)     Temp Source 06/19/16 1718 Oral     SpO2 06/19/16 1718 100 %     Weight 06/19/16 1718 192 lb (87.1 kg)     Height 06/19/16 1718 6' (1.829 m)     Head Circumference --      Peak Flow --      Pain Score 06/19/16  1717 4     Pain Loc --      Pain Edu? --      Excl. in [place]? --     Constitutional: Alert and oriented. Well appearing and in no distress. Eyes: Normal exam ENT   Head: Normocephalic and atraumatic   Mouth/Throat: Mucous membranes are moist. Cardiovascular: Regular rhythm, rate around 120 bpm. No murmur. Respiratory: Normal respiratory effort without tachypnea nor retractions. Breath sounds are clear . Gastrointestinal: Soft and nontender. No distention.   Musculoskeletal: Nontender with normal range of motion in all extremities.  Neurologic:  Normal speech and language. No gross focal  neurologic deficits  Skin:  Skin is warm, dry and intact.  Psychiatric: Mood and affect are normal.   ____________________________________________    EKG  EKG reviewed and interpreted by myself shows sinus tachycardia 114 bpm, narrow QRS, normal axis, large a normal intervals with a mild QTC prolongation, nonspecific ST changes. No obvious ST elevation.  ____________________________________________    INITIAL IMPRESSION / ASSESSMENT AND PLAN / ED COURSE  Pertinent labs & imaging results that were available during my care of the patient were reviewed by me and considered in my medical decision making (see chart for details).  Patient presents to the emergency department for nausea vomiting diarrhea, black in color for the past 2 days. A rectal examination patient has melena strongly guaiac positive. Patient's labs are resulted showing a 4-point drop in hemoglobin from one month ago. Patient's chemistry is largely unchanged. Currently awaiting urinalysis results. Given the patient's history of alcohol abuse with melena and a 4-point hemoglobin drop, as well as tachycardia, we will start the patient on a Protonix bolus and infusion as well as an infusion of octreotide. Patient will require admission to the hospital.  CRITICAL CARE Performed by: Harvest Dark   Total critical care time: 30 minutes  Critical care time was exclusive of separately billable procedures and treating other patients.  Critical care was necessary to treat or prevent imminent or life-threatening deterioration.  Critical care was time spent personally by me on the following activities: development of treatment plan with patient and/or surrogate as well as nursing, discussions with consultants, evaluation of patient's response to treatment, examination of patient, obtaining history from patient or surrogate, ordering and performing treatments and interventions, ordering and review of laboratory studies, ordering  and review of radiographic studies, pulse oximetry and re-evaluation of patient's condition.   ____________________________________________   FINAL CLINICAL IMPRESSION(S) / ED DIAGNOSES  GI bleed Vomiting    Harvest Dark, MD 06/19/16 2106

## 2016-06-19 NOTE — ED Notes (Signed)
Redraw done for type and screen

## 2016-06-19 NOTE — ED Triage Notes (Signed)
Pt states vomiting and inability of PO intake since the end of March, states he was was told he had some bacteria in his blood stream and a "leison on his liver", states intermittent abd pain, awake and alert

## 2016-06-20 ENCOUNTER — Encounter: Payer: Self-pay | Admitting: *Deleted

## 2016-06-20 ENCOUNTER — Inpatient Hospital Stay: Payer: 59

## 2016-06-20 DIAGNOSIS — D649 Anemia, unspecified: Secondary | ICD-10-CM

## 2016-06-20 DIAGNOSIS — F17210 Nicotine dependence, cigarettes, uncomplicated: Secondary | ICD-10-CM

## 2016-06-20 DIAGNOSIS — R531 Weakness: Secondary | ICD-10-CM

## 2016-06-20 DIAGNOSIS — Z8619 Personal history of other infectious and parasitic diseases: Secondary | ICD-10-CM

## 2016-06-20 DIAGNOSIS — R1110 Vomiting, unspecified: Secondary | ICD-10-CM

## 2016-06-20 DIAGNOSIS — R5383 Other fatigue: Secondary | ICD-10-CM

## 2016-06-20 DIAGNOSIS — R160 Hepatomegaly, not elsewhere classified: Secondary | ICD-10-CM

## 2016-06-20 DIAGNOSIS — F1020 Alcohol dependence, uncomplicated: Secondary | ICD-10-CM

## 2016-06-20 DIAGNOSIS — Z79899 Other long term (current) drug therapy: Secondary | ICD-10-CM

## 2016-06-20 DIAGNOSIS — K769 Liver disease, unspecified: Secondary | ICD-10-CM

## 2016-06-20 DIAGNOSIS — J449 Chronic obstructive pulmonary disease, unspecified: Secondary | ICD-10-CM

## 2016-06-20 DIAGNOSIS — I81 Portal vein thrombosis: Secondary | ICD-10-CM

## 2016-06-20 LAB — BASIC METABOLIC PANEL
Anion gap: 3 — ABNORMAL LOW (ref 5–15)
BUN: 36 mg/dL — AB (ref 6–20)
CALCIUM: 7.3 mg/dL — AB (ref 8.9–10.3)
CO2: 26 mmol/L (ref 22–32)
Chloride: 109 mmol/L (ref 101–111)
Creatinine, Ser: 0.82 mg/dL (ref 0.61–1.24)
GFR calc non Af Amer: >60 mL/min (ref >60)
Glucose, Bld: 98 mg/dL (ref 65–99)
Potassium: 4.1 mmol/L (ref 3.5–5.1)
Sodium: 138 mmol/L (ref 135–145)

## 2016-06-20 LAB — HEMOGLOBIN
HEMOGLOBIN: 9.3 g/dL — AB (ref 13.0–18.0)
HEMOGLOBIN: 9 g/dL — AB (ref 13.0–18.0)

## 2016-06-20 LAB — CBC
HCT: 28.1 % — ABNORMAL LOW (ref 40.0–52.0)
Hemoglobin: 9 g/dL — ABNORMAL LOW (ref 13.0–18.0)
MCH: 27.7 pg (ref 26.0–34.0)
MCHC: 32 g/dL (ref 32.0–36.0)
MCV: 86.5 fL (ref 80.0–100.0)
PLATELETS: 169 10*3/uL (ref 150–440)
RBC: 3.25 MIL/uL — AB (ref 4.40–5.90)
RDW: 21.9 % — AB (ref 11.5–14.5)
WBC: 7.8 10*3/uL (ref 3.8–10.6)

## 2016-06-20 MED ORDER — TRAMADOL HCL 50 MG PO TABS
50.0000 mg | ORAL_TABLET | Freq: Four times a day (QID) | ORAL | Status: DC | PRN
  Administered 2016-06-21 – 2016-06-24 (×7): 50 mg via ORAL
  Filled 2016-06-20 (×7): qty 1

## 2016-06-20 MED ORDER — VITAMIN B-1 100 MG PO TABS
100.0000 mg | ORAL_TABLET | Freq: Every day | ORAL | Status: DC
  Administered 2016-06-20 – 2016-06-24 (×5): 100 mg via ORAL
  Filled 2016-06-20 (×5): qty 1

## 2016-06-20 MED ORDER — ADULT MULTIVITAMIN W/MINERALS CH
1.0000 | ORAL_TABLET | Freq: Every day | ORAL | Status: DC
  Administered 2016-06-20 – 2016-06-24 (×5): 1 via ORAL
  Filled 2016-06-20 (×5): qty 1

## 2016-06-20 MED ORDER — SODIUM CHLORIDE 0.9% FLUSH
3.0000 mL | Freq: Two times a day (BID) | INTRAVENOUS | Status: DC
  Administered 2016-06-21 – 2016-06-23 (×6): 3 mL via INTRAVENOUS

## 2016-06-20 MED ORDER — LORAZEPAM 2 MG/ML IJ SOLN: 1.0000 mg | Freq: Four times a day (QID) | INTRAMUSCULAR | Status: DC | PRN

## 2016-06-20 MED ORDER — ONDANSETRON HCL 4 MG/2ML IJ SOLN: 4.0000 mg | Freq: Four times a day (QID) | INTRAMUSCULAR | Status: DC | PRN

## 2016-06-20 MED ORDER — SODIUM CHLORIDE 0.9 % IV SOLN: INTRAVENOUS | Status: DC

## 2016-06-20 MED ORDER — THIAMINE HCL 100 MG/ML IJ SOLN: 100.0000 mg | Freq: Every day | INTRAMUSCULAR | Status: DC

## 2016-06-20 MED ORDER — ACETAMINOPHEN 325 MG PO TABS
650.0000 mg | ORAL_TABLET | Freq: Four times a day (QID) | ORAL | Status: DC | PRN
  Administered 2016-06-20 – 2016-06-22 (×3): 650 mg via ORAL
  Filled 2016-06-20 (×3): qty 2

## 2016-06-20 MED ORDER — SODIUM CHLORIDE 0.9 % IV SOLN
INTRAVENOUS | Status: DC
Start: 2016-06-20 — End: 2016-06-20
  Administered 2016-06-20: 02:00:00 via INTRAVENOUS

## 2016-06-20 MED ORDER — LORAZEPAM 2 MG/ML IJ SOLN: 0.0000 mg | Freq: Two times a day (BID) | INTRAMUSCULAR | Status: DC

## 2016-06-20 MED ORDER — LORAZEPAM 2 MG/ML IJ SOLN: 0.0000 mg | Freq: Four times a day (QID) | INTRAMUSCULAR | Status: DC

## 2016-06-20 MED ORDER — ALBUTEROL SULFATE (2.5 MG/3ML) 0.083% IN NEBU: 3.0000 mL | INHALATION_SOLUTION | Freq: Four times a day (QID) | RESPIRATORY_TRACT | Status: DC | PRN

## 2016-06-20 MED ORDER — ONDANSETRON HCL 4 MG PO TABS: 4.0000 mg | ORAL_TABLET | Freq: Four times a day (QID) | ORAL | Status: DC | PRN

## 2016-06-20 MED ORDER — BOOST / RESOURCE BREEZE PO LIQD
1.0000 | Freq: Three times a day (TID) | ORAL | Status: DC
  Administered 2016-06-20 – 2016-06-21 (×3): 1 via ORAL

## 2016-06-20 MED ORDER — FOLIC ACID 1 MG PO TABS
1.0000 mg | ORAL_TABLET | Freq: Every day | ORAL | Status: DC
  Administered 2016-06-20 – 2016-06-24 (×5): 1 mg via ORAL
  Filled 2016-06-20 (×5): qty 1

## 2016-06-20 MED ORDER — LORAZEPAM 1 MG PO TABS: 1.0000 mg | ORAL_TABLET | Freq: Four times a day (QID) | ORAL | Status: DC | PRN

## 2016-06-20 MED ORDER — ACETAMINOPHEN 650 MG RE SUPP: 650.0000 mg | Freq: Four times a day (QID) | RECTAL | Status: DC | PRN

## 2016-06-20 MED ORDER — SODIUM CHLORIDE 0.9 % IV SOLN
INTRAVENOUS | Status: DC
  Administered 2016-06-20: 11:00:00 via INTRAVENOUS

## 2016-06-20 NOTE — Progress Notes (Signed)
Initial Nutrition Assessment  DOCUMENTATION CODES:   Severe malnutrition in context of acute illness/injury  INTERVENTION:  Provide Boost Breeze po TID, each supplement provides 250 kcal and 9 grams of protein. When diet advanced past clear liquids, patient prefers chocolate Ensure.  Continue multivitamin with minerals, 1 mg folic acid, 638 mg thiamine daily.   NUTRITION DIAGNOSIS:   Malnutrition (Severe) related to acute illness (hepatocellular carcinoma, cirrhosis) as evidenced by 8.2 percent weight loss over 5 weeks, moderate depletions of muscle mass, severe depletion of muscle mass, moderate depletion of body fat, severe depletion of body fat.  GOAL:   Patient will meet greater than or equal to 90% of their needs  MONITOR:   PO intake, Supplement acceptance, Diet advancement, Labs, Weight trends, I & O's  REASON FOR ASSESSMENT:   Malnutrition Screening Tool    ASSESSMENT:   61 year old male with PMHx of COPD, Hepatitis C, EtOH abuse who presents with hematemesis and melena, found to have hepatocellular carcinoma, cirrhosis, portal hypertension.   -CT Abd 05/19/2016 showing liver mets and infiltrating hepatocellular carcinoma to portal vein -[facility] abdomen showing liver lesions and portal vein thrombosis. -Pending GI consult. Per chart coffee-ground emesis possibly related to esophageal or gastric varices.  Spoke with patient at bedside. Patient known to this RD from previous admission. He reports his appetite remains poor due to early satiety and never really improved over the past month. Patient works third shift. He has been eating two meals per day and a snack. His dinner before work is typically meat with potatoes, but he has not been able to finish his meals. His snack and other meal are from a vending machine at work - usually peanut butter crackers, chips, or something similar. However the 3 days PTA patient reports he has not been able to tolerate any food at all due to  his hematemesis. He enjoys chocolate Ensure and Boost Breeze but had not been drinking them regularly PTA.  UBW was 200-205 lbs. His weight loss from 205 lbs to 194.2 lbs had been over the one week prior to last admission. Patient has now lost a total of 16.8 lbs (8.2% body weight) over the past 5 weeks, which is significant for time frame.  Medications reviewed and include: folic acid 1 mg daily, multivitamin with minerals daily, thiamine 100 mg daily, octreotide, pantoprazole.  Labs reviewed: BUN 36, Hgb 9, Hct 28.1, RDW 21.9.  Nutrition-Focused physical exam completed. Findings are moderate-severe fat depletion, moderate-severe muscle depletion, and no edema. Patient reports he remains ambulatory but feels significantly weaker.   Diet Order:  Diet clear liquid Room service appropriate? Yes; Fluid consistency: Thin Diet NPO time specified Except for: Sips with Meds  Skin:  Reviewed, no issues  Last BM:  06/19/2016  Height:   Ht Readings from Last 1 Encounters:  06/20/16 6' (1.829 m)    Weight:   Wt Readings from Last 1 Encounters:  06/20/16 188 lb 3.2 oz (85.4 kg)    Ideal Body Weight:  80.9 kg  BMI:  Body mass index is 25.52 kg/m.  Estimated Nutritional Needs:   Kcal:  9373-4287 (MSJ x 1.2-1.4)  Protein:  110-130 grams (1.3-1.5 grams/kg)  Fluid:  2-2.4 L/day  EDUCATION NEEDS:   No education needs identified at this time  Willey Blade, MS, RD, LDN Pager: [phone] After Hours Pager: [phone]

## 2016-06-20 NOTE — Progress Notes (Signed)
Inpatient Diabetes Program Recommendations  AACE/ADA: New Consensus Statement on Inpatient Glycemic Control (2015)  Target Ranges:  Prepandial:   less than 140 mg/dL      Peak postprandial:   less than 180 mg/dL (1-2 hours)      Critically ill patients:  140 - 180 mg/dL  Results for DERECK, AGERTON (MRN [id number]) as of 06/20/2016 07:52  Ref. Range 06/19/2016 17:16  Glucose Latest Ref Range: 65 - 99 mg/dL 246 (H)   Results for LEIBY, PIGEON (MRN [id number]) as of 06/20/2016 07:52  Ref. Range 06/19/2016 17:16 06/20/2016 03:07  Hemoglobin Latest Ref Range: 13.0 - 18.0 g/dL 11.2 (L) 9.3 (L)   Review of Glycemic Control  Diabetes history: No Outpatient Diabetes medications: NA Current orders for Inpatient glycemic control: None  Inpatient Diabetes Program Recommendations: Correction (SSI): Initial lab glucose 246 mg/dl. May want to consider ordering CBGs with Novolog 0-9 units Q4H to evaluate glycemic control while inpatient. HgbA1C: No A1C in the chart. Due to low hemoglobin if A1C ordered it will likely not be accurate at this time.  Thanks, Barnie Alderman, RN, MSN, CDE Diabetes Coordinator Inpatient Diabetes Program [phone] (Team Pager from 8am to 5pm)

## 2016-06-20 NOTE — Consult Note (Signed)
[name]  [phone] [phone]  ID: Donald Bowers OB: 21-Feb-1956  MR#: [id number]  VQM#:[id number]  Patient Care Team: Dion Body, MD as PCP - General (Family Medicine)  CHIEF COMPLAINT: Coffee-ground emesis, liver mass.  INTERVAL HISTORY: Patient is a 61 year old male who was recently admitted to the emergency room with coffee-ground emesis. Patient was previously noted to also have a liver mass consistent with hepatocellular carcinoma. Currently, he feels weak and fatigued but otherwise feels well. He has no neurologic complaints. He does not complain of pain. He has a fair appetite, but denies weight loss. He denies any fevers. He has no chest pain or shortness of breath. He denies any further coffee-ground emesis. He has no nausea, vomiting, constipation, or diarrhea. He has no urinary complaints. Patient otherwise feels well and offers no further specific complaints.  REVIEW OF SYSTEMS:   Review of Systems  Constitutional: Positive for malaise/fatigue. Negative for fever and weight loss.  Respiratory: Negative.  Negative for cough and shortness of breath.   Cardiovascular: Negative.  Negative for chest pain and leg swelling.  Gastrointestinal: Negative for abdominal pain, blood in stool, constipation, diarrhea, melena, nausea and vomiting.  Genitourinary: Negative.   Musculoskeletal: Negative.   Neurological: Positive for weakness.  Psychiatric/Behavioral: Negative.  The patient is not nervous/anxious.     As per HPI. Otherwise, a complete review of systems is negative.  PAST MEDICAL HISTORY: Past Medical History:  Diagnosis Date  . COPD (chronic obstructive pulmonary disease) ([place])   . Hepatitis C     PAST SURGICAL HISTORY: Past Surgical History:  Procedure Laterality Date  . CARDIAC CATHETERIZATION      FAMILY HISTORY: Family History  Problem Relation Age of Onset  . COPD Mother   . Diabetes Sister      ADVANCED DIRECTIVES (Y/N):  @ADVDIR @  HEALTH MAINTENANCE: Social History  Substance Use Topics  . Smoking status: Current Every Day Smoker    Packs/day: 0.50    Types: Cigarettes  . Smokeless tobacco: Never Used     Comment: pt said he has all the information he needs   . Alcohol use 7.2 oz/week    12 Cans of beer per week     Comment: weekly/daily, pt report drinking 2 can in 2weeks     Colonoscopy:  PAP:  Bone density:  Lipid panel:  No Known Allergies  Current Facility-Administered Medications  Medication Dose Route Frequency Provider Last Rate Last Dose  . acetaminophen (TYLENOL) tablet 650 mg  650 mg Oral Q6H PRN Lance Coon, MD       Or  . acetaminophen (TYLENOL) suppository 650 mg  650 mg Rectal Q6H PRN Lance Coon, MD      . albuterol (PROVENTIL) (2.5 MG/3ML) 0.083% nebulizer solution 3 mL  3 mL Inhalation Q6H PRN Lance Coon, MD      . folic acid (FOLVITE) tablet 1 mg  1 mg Oral Daily Lance Coon, MD   1 mg at 06/20/16 0911  . LORazepam (ATIVAN) injection 0-4 mg  0-4 mg Intravenous Q6H Lance Coon, MD       Followed by  . [START ON 06/22/2016] LORazepam (ATIVAN) injection 0-4 mg  0-4 mg Intravenous Q12H Lance Coon, MD      . LORazepam (ATIVAN) tablet 1 mg  1 mg Oral Q6H PRN Lance Coon, MD       Or  . LORazepam (ATIVAN) injection 1 mg  1 mg Intravenous Q6H PRN Lance Coon, MD      .  multivitamin with minerals tablet 1 tablet  1 tablet Oral Daily Lance Coon, MD   1 tablet at 06/20/16 0911  . octreotide (SANDOSTATIN) 500 mcg in sodium chloride 0.9 % 250 mL (2 mcg/mL) infusion  50 mcg/hr Intravenous Continuous Harvest Dark, MD 25 mL/hr at 06/20/16 0742 50 mcg/hr at 06/20/16 0742  . ondansetron (ZOFRAN) tablet 4 mg  4 mg Oral Q6H PRN Lance Coon, MD       Or  . ondansetron [facility]) injection 4 mg  4 mg Intravenous Q6H PRN Lance Coon, MD      . pantoprazole (PROTONIX) 80 mg in sodium chloride 0.9 % 250 mL (0.32 mg/mL) infusion  8 mg/hr Intravenous  Continuous Harvest Dark, MD 25 mL/hr at 06/20/16 0742 8 mg/hr at 06/20/16 0742  . sodium chloride flush (NS) 0.9 % injection 3 mL  3 mL Intravenous Q12H Lance Coon, MD      . thiamine (VITAMIN B-1) tablet 100 mg  100 mg Oral Daily Lance Coon, MD   100 mg at 06/20/16 3419   Or  . thiamine (B-1) injection 100 mg  100 mg Intravenous Daily Lance Coon, MD        OBJECTIVE: Vitals:   06/20/16 0246 06/20/16 0447  BP: 115/63 (!) 100/54  Pulse: 96 100  Resp: 20 16  Temp: 98.5 F (36.9 C) 98.3 F (36.8 C)     Body mass index is 25.52 kg/m.    ECOG FS:1 - Symptomatic but completely ambulatory  General: Well-developed, well-nourished, no acute distress. Eyes: Pink conjunctiva, anicteric sclera. HEENT: Normocephalic, moist mucous membranes, clear oropharnyx. Lungs: Clear to auscultation bilaterally. Heart: Regular rate and rhythm. No rubs, murmurs, or gallops. Abdomen: Soft, nontender, nondistended. No organomegaly noted, normoactive bowel sounds. Musculoskeletal: No edema, cyanosis, or clubbing. Neuro: Alert, answering all questions appropriately. Cranial nerves grossly intact. Skin: No rashes or petechiae noted. Psych: Normal affect. Lymphatics: No cervical, calvicular, axillary or inguinal LAD.   LAB RESULTS:  Lab Results  Component Value Date   NA 138 06/20/2016   K 4.1 06/20/2016   CL 109 06/20/2016   CO2 26 06/20/2016   GLUCOSE 98 06/20/2016   BUN 36 (H) 06/20/2016   CREATININE 0.82 06/20/2016   CALCIUM 7.3 (L) 06/20/2016   PROT 6.8 06/19/2016   ALBUMIN 2.1 (L) 06/19/2016   AST 78 (H) 06/19/2016   ALT 29 06/19/2016   ALKPHOS 88 06/19/2016   BILITOT 3.8 (H) 06/19/2016   GFRNONAA >60 06/20/2016   GFRAA >60 06/20/2016    Lab Results  Component Value Date   WBC 7.8 06/20/2016   NEUTROABS 4.5 10/23/2015   HGB 9.0 (L) 06/20/2016   HCT 28.1 (L) 06/20/2016   MCV 86.5 06/20/2016   PLT 169 06/20/2016     STUDIES: US Abdomen Limited Ruq  Result Date:  06/20/2016 CLINICAL DATA:  Hematemesis, abdominal pain. EXAM: US ABDOMEN LIMITED - RIGHT UPPER QUADRANT COMPARISON:  CT scan of May 19, 2016. Ultrasound of May 19, 2016. FINDINGS: Gallbladder: No gallstones are noted, but mild gallbladder wall thickening at 4 mm is noted. No pericholecystic fluid or sonographic Murphy's sign is noted. Common bile duct: Diameter: 4.8 mm which is within normal limits. Liver: Nodular hepatic contours are noted consistent with hepatic cirrhosis. At least 2 hypoechoic masses are noted, with the largest measuring 3.5 cm posteriorly in right hepatic lobe. These are concerning for metastatic disease. The distal portal vein appears to be thrombosed with hepatofugal flow seen proximally. IMPRESSION: Mild gallbladder wall thickening is noted  most likely due to adjacent hepatocellular disease. Multiple hepatic masses are noted concerning for metastatic disease. Findings consistent with hepatic cirrhosis. Thrombosis of portal vein is noted. These results will be called to the ordering clinician or representative by the Radiologist Assistant, and communication documented in the PACS or zVision Dashboard. Electronically Signed   By: Marijo Conception, M.D.   On: 06/20/2016 10:50    ASSESSMENT: Coffee-ground emesis, liver mass.  PLAN:    1. Liver mass: Given patient's history of hepatitis C and alcoholism as well as cirrhosis noted on abdominal ultrasound, this is highly suspicious for underlying hepatocellular carcinoma. Tumor markers including AFP, CEA, and CA-19-9 are pending at time of dictation. Patient will ultimately require biopsy to confirm the diagnosis which is currently scheduled for Monday, June 23, 2016 pending results of patient EGD. Patient can follow-up in the [facility] next week after discharge to discuss his biopsy results and treatment planning if desired. 2. Coffee-ground emesis: Likely secondary to underlying cirrhosis and portal hypertension. GI planning on EGD  in the near future. 3. Portal vein thrombosis: Hold anticoagulation given patient's active bleeding. 4. Anemia: Secondary to GI bleed, monitor. Patient does not require blood transfusion at this time.  Appreciate consult, will follow.   Lloyd Huger, MD   06/20/2016 3:11 PM

## 2016-06-20 NOTE — Progress Notes (Addendum)
[facility] [place] NAME: Donald Bowers    MR#:  [id number]  DATE OF BIRTH:  Jul 05, 1955  SUBJECTIVE:  Came in with coffee ground emesis  REVIEW OF SYSTEMS:   Review of Systems  Constitutional: Negative for chills, fever and weight loss.  HENT: Negative for ear discharge, ear pain and nosebleeds.   Eyes: Negative for blurred vision, pain and discharge.  Respiratory: Negative for sputum production, shortness of breath, wheezing and stridor.   Cardiovascular: Negative for chest pain, palpitations, orthopnea and PND.  Gastrointestinal: Positive for blood in stool, melena, nausea and vomiting. Negative for abdominal pain and diarrhea.  Genitourinary: Negative for frequency and urgency.  Musculoskeletal: Negative for back pain and joint pain.  Neurological: Positive for weakness. Negative for sensory change, speech change and focal weakness.  Psychiatric/Behavioral: Negative for depression and hallucinations. The patient is not nervous/anxious.    Tolerating Diet:very little Tolerating PT: pending  DRUG ALLERGIES:  No Known Allergies  VITALS:  Blood pressure (!) 100/54, pulse 100, temperature 98.3 F (36.8 C), temperature source Oral, resp. rate 16, height 6' (1.829 m), weight 85.4 kg (188 lb 3.2 oz), SpO2 95 %.  PHYSICAL EXAMINATION:   Physical Exam  GENERAL:  61 y.o.-year-old patient lying in the bed with no acute distress. Appears chronically ill EYES: Pupils equal, round, reactive to light and accommodation. No scleral icterus. Extraocular muscles intact.  HEENT: Head atraumatic, normocephalic. Oropharynx and nasopharynx clear. Pallor + NECK:  Supple, no jugular venous distention. No thyroid enlargement, no tenderness.  LUNGS: Normal breath sounds bilaterally, no wheezing, rales, rhonchi. No use of accessory muscles of respiration.  CARDIOVASCULAR: S1, S2 normal. No murmurs, rubs, or gallops.  ABDOMEN: Soft, nontender,  nondistended. Bowel sounds present. No organomegaly or mass.  EXTREMITIES: No cyanosis, clubbing or edema b/l.    NEUROLOGIC: Cranial nerves II through XII are intact. No focal Motor or sensory deficits b/l. No tremors  PSYCHIATRIC:  patient is alert and oriented x 3.  SKIN: No obvious rash, lesion, or ulcer.   LABORATORY PANEL:  CBC  Recent Labs Lab 06/20/16 0858  WBC 7.8  HGB 9.0*  HCT 28.1*  PLT 169    Chemistries   Recent Labs Lab 06/19/16 1716 06/20/16 0858  NA 134* 138  K 3.6 4.1  CL 98* 109  CO2 30 26  GLUCOSE 246* 98  BUN 48* 36*  CREATININE 1.04 0.82  CALCIUM 8.0* 7.3*  AST 78*  --   ALT 29  --   ALKPHOS 88  --   BILITOT 3.8*  --    Cardiac Enzymes No results for input(s): TROPONINI in the last 168 hours. RADIOLOGY:  US Abdomen Limited Ruq  Result Date: 06/20/2016 CLINICAL DATA:  Hematemesis, abdominal pain. EXAM: US ABDOMEN LIMITED - RIGHT UPPER QUADRANT COMPARISON:  CT scan of May 19, 2016. Ultrasound of May 19, 2016. FINDINGS: Gallbladder: No gallstones are noted, but mild gallbladder wall thickening at 4 mm is noted. No pericholecystic fluid or sonographic Murphy's sign is noted. Common bile duct: Diameter: 4.8 mm which is within normal limits. Liver: Nodular hepatic contours are noted consistent with hepatic cirrhosis. At least 2 hypoechoic masses are noted, with the largest measuring 3.5 cm posteriorly in right hepatic lobe. These are concerning for metastatic disease. The distal portal vein appears to be thrombosed with hepatofugal flow seen proximally. IMPRESSION: Mild gallbladder wall thickening is noted most likely due to adjacent hepatocellular disease. Multiple hepatic masses are noted  concerning for metastatic disease. Findings consistent with hepatic cirrhosis. Thrombosis of portal vein is noted. These results will be called to the ordering clinician or representative by the Radiologist Assistant, and communication documented in the PACS or zVision  Dashboard. Electronically Signed   By: Marijo Conception, M.D.   On: 06/20/2016 10:50   ASSESSMENT AND PLAN:  Saxton Chain  is a 61 y.o. male who presents with Hematemesis and melena for the last 18-24 hours. He denies any abdominal pain.   1.coffee ground emesis/Hematemesis - suspect upper GI bleed, possibly variceal, the patient has no prior diagnosis of esophageal or gastric varices. PPI drip started, octreotide drip started -GI consult today -USG abdomen shows liver lesions and portal vein thrombosis -Oncology to see pt -pt was to be referred to GI as out pt after he finished his abx rx for TUI sepsis -CT abd 05/19/17 shows liver mets and infiltrating HCC to portal vein. -will defer further eval -hgb 15--11--9.3  2. Chronic alcoholism (pt reports has drank only 2 beers in the last 2-3 weeks) -prn ativan  3. Cirrhosis of liver as noted on USG abd With h/o chronic alcoholism and h/o chronic hepatitis C  4. 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 Management plans discussed with the patient, family and they are in agreement.  CODE STATUS: Full  DVT Prophylaxis: SCD  TOTAL TIME TAKING CARE OF THIS PATIENT:  40 minutes.  >50% time spent on counselling and coordination of care  POSSIBLE D/C IN 1-2 DAYS, DEPENDING ON CLINICAL CONDITION.  Note: This dictation was prepared with Dragon dictation along with smaller phrase technology. Any transcriptional errors that result from this process are unintentional.  Madelena Maturin M.D on 06/20/2016 at 12:17 PM  Between 7am to 6pm - Pager - [phone]  After 6pm go to [email or web address] - password [email or web address] [facility]  Office  [phone]  CC: Primary care physician; Dion Body, MD

## 2016-06-21 ENCOUNTER — Inpatient Hospital Stay: Payer: 59 | Admitting: *Deleted

## 2016-06-21 ENCOUNTER — Encounter
Admission: EM | Disposition: A | Discharge status: Home / Self Care | Payer: Self-pay | Source: Home / Self Care | Attending: Internal Medicine

## 2016-06-21 HISTORY — PX: ESOPHAGOGASTRODUODENOSCOPY (EGD) WITH PROPOFOL: SHX5813

## 2016-06-21 LAB — HEMOGLOBIN
HEMOGLOBIN: 9.1 g/dL — AB (ref 13.0–18.0)
Hemoglobin: 8.9 g/dL — ABNORMAL LOW (ref 13.0–18.0)
Hemoglobin: 8 g/dL — ABNORMAL LOW (ref 13.0–18.0)

## 2016-06-21 LAB — URINALYSIS, COMPLETE (UACMP) WITH MICROSCOPIC
BILIRUBIN URINE: NEGATIVE
Glucose, UA: NEGATIVE mg/dL
Hgb urine dipstick: NEGATIVE
KETONES UR: NEGATIVE mg/dL
NITRITE: NEGATIVE
Protein, ur: NEGATIVE mg/dL
Specific Gravity, Urine: 1.012 (ref 1.005–1.030)
Squamous Epithelial / LPF: NONE SEEN
pH: 6 (ref 5.0–8.0)

## 2016-06-21 LAB — AFP TUMOR MARKER

## 2016-06-21 LAB — CEA: CEA: 2.7 ng/mL (ref 0.0–4.7)

## 2016-06-21 SURGERY — ESOPHAGOGASTRODUODENOSCOPY (EGD) WITH PROPOFOL
Anesthesia: General

## 2016-06-21 SURGERY — EGD (ESOPHAGOGASTRODUODENOSCOPY)
Anesthesia: Monitor Anesthesia Care

## 2016-06-21 MED ORDER — FENTANYL CITRATE (PF) 100 MCG/2ML IJ SOLN
INTRAMUSCULAR | Status: DC | PRN
  Administered 2016-06-21 (×2): 50 ug via INTRAVENOUS

## 2016-06-21 MED ORDER — PANTOPRAZOLE SODIUM 40 MG IV SOLR
40.0000 mg | Freq: Two times a day (BID) | INTRAVENOUS | Status: DC
  Administered 2016-06-21 – 2016-06-23 (×6): 40 mg via INTRAVENOUS
  Filled 2016-06-21 (×6): qty 40

## 2016-06-21 MED ORDER — KETAMINE HCL 50 MG/ML IJ SOLN
INTRAMUSCULAR | Status: AC
  Filled 2016-06-21: qty 10

## 2016-06-21 MED ORDER — MIDAZOLAM HCL 2 MG/2ML IJ SOLN
INTRAMUSCULAR | Status: DC | PRN
  Administered 2016-06-21 (×2): 1 mg via INTRAVENOUS

## 2016-06-21 MED ORDER — PROPOFOL 10 MG/ML IV BOLUS
INTRAVENOUS | Status: AC
  Filled 2016-06-21: qty 40

## 2016-06-21 MED ORDER — MIDAZOLAM HCL 2 MG/2ML IJ SOLN
INTRAMUSCULAR | Status: AC
  Filled 2016-06-21: qty 2

## 2016-06-21 MED ORDER — CIPROFLOXACIN IN D5W 400 MG/200ML IV SOLN
400.0000 mg | Freq: Two times a day (BID) | INTRAVENOUS | Status: DC
  Administered 2016-06-21 – 2016-06-22 (×3): 400 mg via INTRAVENOUS
  Filled 2016-06-21 (×4): qty 200

## 2016-06-21 MED ORDER — ONDANSETRON HCL 4 MG/2ML IJ SOLN
INTRAMUSCULAR | Status: DC | PRN
  Administered 2016-06-21: 4 mg via INTRAVENOUS

## 2016-06-21 MED ORDER — ROCURONIUM BROMIDE 100 MG/10ML IV SOLN
INTRAVENOUS | Status: DC | PRN
  Administered 2016-06-21: 5 mg via INTRAVENOUS

## 2016-06-21 MED ORDER — PROPOFOL 10 MG/ML IV BOLUS
INTRAVENOUS | Status: DC | PRN
  Administered 2016-06-21: 150 mg via INTRAVENOUS

## 2016-06-21 MED ORDER — SODIUM CHLORIDE 0.9 % IV SOLN
INTRAVENOUS | Status: DC
  Administered 2016-06-21 – 2016-06-22 (×2): via INTRAVENOUS

## 2016-06-21 MED ORDER — ONDANSETRON HCL 4 MG/2ML IJ SOLN: 4.0000 mg | Freq: Once | INTRAMUSCULAR | Status: DC | PRN

## 2016-06-21 MED ORDER — FENTANYL CITRATE (PF) 100 MCG/2ML IJ SOLN
INTRAMUSCULAR | Status: AC
  Filled 2016-06-21: qty 2

## 2016-06-21 MED ORDER — FENTANYL CITRATE (PF) 100 MCG/2ML IJ SOLN: 25.0000 ug | INTRAMUSCULAR | Status: DC | PRN

## 2016-06-21 NOTE — Interval H&P Note (Signed)
History and Physical Interval Note:  06/21/2016 1:04 PM  Donald Bowers  has presented today for surgery, with the diagnosis of GI bleed  The various methods of treatment have been discussed with the patient and family. After consideration of risks, benefits and other options for treatment, the patient has consented to  Procedure(s): ESOPHAGOGASTRODUODENOSCOPY (EGD) WITH PROPOFOL (N/A) as a surgical intervention .  The patient's history has been reviewed, patient examined, no change in status, stable for surgery.  I have reviewed the patient's chart and labs.  Questions were answered to the patient's satisfaction.     [place] C.

## 2016-06-21 NOTE — Anesthesia Post-op Follow-up Note (Cosign Needed)
Anesthesia QCDR form completed.        

## 2016-06-21 NOTE — Transfer of Care (Signed)
Immediate Anesthesia Transfer of Care Note  Patient: JAXXSON CAVANAH  Procedure(s) Performed: Procedure(s): ESOPHAGOGASTRODUODENOSCOPY (EGD) WITH PROPOFOL (N/A)  Patient Location: PACU  Anesthesia Type:General  Level of Consciousness: sedated  Airway & Oxygen Therapy: Patient Spontanous Breathing and Patient connected to face mask oxygen  Post-op Assessment: Report given to RN and Post -op Vital signs reviewed and stable  Post vital signs: Reviewed and stable  Last Vitals:  Vitals:   06/21/16 1202 06/21/16 1240  BP: 121/65 123/77  Pulse: 88 92  Resp: 20 18  Temp: 36.8 C     Last Pain:  Vitals:   06/21/16 1240  TempSrc:   PainSc: 0-No pain         Complications: No apparent anesthesia complications

## 2016-06-21 NOTE — Consult Note (Signed)
Referring Provider: Dr. Posey Pronto Primary Care Physician:  Dion Body, MD Primary Gastroenterologist:  Althia Forts  Reason for Consultation:  GI bleed  HPI: Donald Bowers is a 61 y.o. male with cirrhosis and [place] on recent CT seen for a consult due to coffee grounds emesis multiple times (starting this past Wednesday) at home followed by black stools. Recent diagnosis of multifocal Hepatocellular carcinoma on CT scan last month. Cirrhosis with history of Hep C and alcohol abuse. Reports cutting down on alcohol 3-4 weeks ago. Hgb 11.2 on 06/19/16 (15.1 on 05/21/16). Hgb 8 today. No GI bleeding since admit. Was in the hospital last month with nonbloody vomiting and was found to have a UTI. AFP, CA 19-9 pending. CEA within normal limits. Denies NSAIDs. Denies previous GI bleeding. No known history of ulcers. Denies previous EGD or colonoscopy.  Past Medical History:  Diagnosis Date  . COPD (chronic obstructive pulmonary disease) ([place])   . Hepatitis C     Past Surgical History:  Procedure Laterality Date  . CARDIAC CATHETERIZATION      Prior to Admission medications   Not on File    Scheduled Meds: . folic acid  1 mg Oral Daily  . LORazepam  0-4 mg Intravenous Q6H   Followed by  . [START ON 06/22/2016] LORazepam  0-4 mg Intravenous Q12H  . multivitamin with minerals  1 tablet Oral Daily  . sodium chloride flush  3 mL Intravenous Q12H  . thiamine  100 mg Oral Daily   Or  . thiamine  100 mg Intravenous Daily   Continuous Infusions: . sodium chloride 50 mL/hr at 06/21/16 0746  . octreotide  (SANDOSTATIN)    IV infusion 50 mcg/hr (06/21/16 0555)  . pantoprozole (PROTONIX) infusion 8 mg/hr (06/21/16 0448)   PRN Meds:.acetaminophen **OR** acetaminophen, albuterol, LORazepam **OR** LORazepam, ondansetron **OR** ondansetron (ZOFRAN) IV, traMADol  Allergies as of 06/19/2016  . (No Known Allergies)    Family History  Problem Relation Age of Onset  . COPD Mother   . Diabetes  Sister     Social History   Social History  . Marital status: Single    Spouse name: N/A  . Number of children: N/A  . Years of education: N/A   Occupational History  . Not on file.   Social History Main Topics  . Smoking status: Current Every Day Smoker    Packs/day: 0.50    Types: Cigarettes  . Smokeless tobacco: Never Used     Comment: pt said he has all the information he needs   . Alcohol use 7.2 oz/week    12 Cans of beer per week     Comment: weekly/daily, pt report drinking 2 can in 2weeks  . Drug use: Yes    Types: "Crack" cocaine     Comment: Last dose Wednesday 06/18/2016  . Sexual activity: Not on file   Other Topics Concern  . Not on file   Social History Narrative  . No narrative on file    Review of Systems: All negative except as stated above in HPI.  Physical Exam: Vital signs: Vitals:   06/21/16 0330 06/21/16 0449  BP: 119/65   Pulse: 100   Resp: 20   Temp: (!) 102.3 F (39.1 C) (!) 100.5 F (38.1 C)   Last BM Date: 06/19/16 General:   Lethargic, thin, disheveled, no acute distress  HEENT: +scleral icterus, oropharynx clear Lungs:  Clear throughout to auscultation.   No wheezes, crackles, or rhonchi. No acute distress. Heart:  Regular rate and rhythm; no murmurs, clicks, rubs,  or gallops. Abdomen: soft, nontender, nondistended, +BS, small umbilical hernia  Rectal:  Deferred Ext: no edema, scaly dry skin on LE  GI:  Lab Results:  Recent Labs  06/19/16 1716  06/20/16 0858 06/20/16 1701 06/21/16 0042 06/21/16 0836  WBC 13.9*  --  7.8  --   --   --   HGB 11.2*  < > 9.0* 9.0* 8.9* 8.0*  HCT 34.9*  --  28.1*  --   --   --   PLT 310  --  169  --   --   --   < > = values in this interval not displayed. BMET  Recent Labs  06/19/16 1716 06/20/16 0858  NA 134* 138  K 3.6 4.1  CL 98* 109  CO2 30 26  GLUCOSE 246* 98  BUN 48* 36*  CREATININE 1.04 0.82  CALCIUM 8.0* 7.3*   LFT  Recent Labs  06/19/16 1716  PROT 6.8   ALBUMIN 2.1*  AST 78*  ALT 29  ALKPHOS 88  BILITOT 3.8*   PT/INR No results for input(s): LABPROT, INR in the last 72 hours.   Studies/Results: US Abdomen Limited Ruq  Result Date: 06/20/2016 CLINICAL DATA:  Hematemesis, abdominal pain. EXAM: US ABDOMEN LIMITED - RIGHT UPPER QUADRANT COMPARISON:  CT scan of May 19, 2016. Ultrasound of May 19, 2016. FINDINGS: Gallbladder: No gallstones are noted, but mild gallbladder wall thickening at 4 mm is noted. No pericholecystic fluid or sonographic Murphy's sign is noted. Common bile duct: Diameter: 4.8 mm which is within normal limits. Liver: Nodular hepatic contours are noted consistent with hepatic cirrhosis. At least 2 hypoechoic masses are noted, with the largest measuring 3.5 cm posteriorly in right hepatic lobe. These are concerning for metastatic disease. The distal portal vein appears to be thrombosed with hepatofugal flow seen proximally. IMPRESSION: Mild gallbladder wall thickening is noted most likely due to adjacent hepatocellular disease. Multiple hepatic masses are noted concerning for metastatic disease. Findings consistent with hepatic cirrhosis. Thrombosis of portal vein is noted. These results will be called to the ordering clinician or representative by the Radiologist Assistant, and communication documented in the PACS or zVision Dashboard. Electronically Signed   By: Marijo Conception, M.D.   On: 06/20/2016 10:50    Impression/Plan: 61 yo with Hep C/alcohol cirrhosis and CT findings concerning for hepatocellular carcinoma presenting with coffee grounds emesis and melena. Question peptic ulcer bleed vs portal gastropathy. Doubt variceal bleed or gastritis. EGD today to further evaluate. Continue Protonix drip, Octreotide drip. Start IV antibiotics due to GI bleeding in the setting of cirrhosis. NPO. Supportive care.    LOS: 2 days   Patrick C.  06/21/2016, 10:04 AM

## 2016-06-21 NOTE — Anesthesia Preprocedure Evaluation (Signed)
Anesthesia Evaluation  Patient identified by MRN, date of birth, ID band Patient awake    Reviewed: Allergy & Precautions, NPO status , Patient's Chart, lab work & pertinent test results  History of Anesthesia Complications Negative for: history of anesthetic complications  Airway Mallampati: II       Dental  (+) Chipped, Missing   Pulmonary COPD,  COPD inhaler, Current Smoker,           Cardiovascular negative cardio ROS       Neuro/Psych negative neurological ROS     GI/Hepatic negative GI ROS, (+) Hepatitis -, C  Endo/Other  negative endocrine ROS  Renal/GU negative Renal ROS     Musculoskeletal   Abdominal   Peds  Hematology negative hematology ROS (+)   Anesthesia Other Findings   Reproductive/Obstetrics                             Anesthesia Physical Anesthesia Plan  ASA: III and emergent  Anesthesia Plan: General   Post-op Pain Management:    Induction: Intravenous  Airway Management Planned: Oral ETT  Additional Equipment:   Intra-op Plan:   Post-operative Plan:   Informed Consent: I have reviewed the patients History and Physical, chart, labs and discussed the procedure including the risks, benefits and alternatives for the proposed anesthesia with the patient or authorized representative who has indicated his/her understanding and acceptance.     Plan Discussed with:   Anesthesia Plan Comments:         Anesthesia Quick Evaluation

## 2016-06-21 NOTE — Anesthesia Postprocedure Evaluation (Signed)
Anesthesia Post Note  Patient: Donald Bowers  Procedure(s) Performed: Procedure(s) (LRB): ESOPHAGOGASTRODUODENOSCOPY (EGD) WITH PROPOFOL (N/A)  Patient location during evaluation: PACU Anesthesia Type: General Level of consciousness: awake and alert Pain management: pain level controlled Vital Signs Assessment: post-procedure vital signs reviewed and stable Respiratory status: spontaneous breathing and respiratory function stable Cardiovascular status: stable Anesthetic complications: no     Last Vitals:  Vitals:   06/21/16 1353 06/21/16 1408  BP: 115/79 111/72  Pulse: 92 93  Resp: 20 20  Temp:  (!) 38.1 C    Last Pain:  Vitals:   06/21/16 1240  TempSrc:   PainSc: 0-No pain                 KEPHART,WILLIAM K

## 2016-06-21 NOTE — Progress Notes (Addendum)
Pt returned from EGD with procedure tolerated well. VSS. Son at bedside updated. IVF's and drips continued. Pt continues to sleep unless aroused; no co's/concerns. Will advance to clear liquid diet. No sign/symptom bleeding today other than pt color is pale. HGB down to 8.0 this a.m.

## 2016-06-21 NOTE — Progress Notes (Signed)
No vomiting or stooling so far this shift. Pt sleeping at long intervals. Reports 5/10 discomfort in RUOQ to lateral side; declined prn's. Son at bedside. Color pale. VSS. HGB down to 8.0. NPO after 0935 meds/breezed for EGD. Infomed consent obtained. Pt transported off unit at 1230.

## 2016-06-21 NOTE — H&P (View-Only) (Signed)
Referring Provider: Dr. Posey Pronto Primary Care Physician:  Dion Body, MD Primary Gastroenterologist:  Althia Forts  Reason for Consultation:  GI bleed  HPI: Donald Bowers is a 61 y.o. male with cirrhosis and [place] on recent CT seen for a consult due to coffee grounds emesis multiple times (starting this past Wednesday) at home followed by black stools. Recent diagnosis of multifocal Hepatocellular carcinoma on CT scan last month. Cirrhosis with history of Hep C and alcohol abuse. Reports cutting down on alcohol 3-4 weeks ago. Hgb 11.2 on 06/19/16 (15.1 on 05/21/16). Hgb 8 today. No GI bleeding since admit. Was in the hospital last month with nonbloody vomiting and was found to have a UTI. AFP, CA 19-9 pending. CEA within normal limits. Denies NSAIDs. Denies previous GI bleeding. No known history of ulcers. Denies previous EGD or colonoscopy.  Past Medical History:  Diagnosis Date  . COPD (chronic obstructive pulmonary disease) ([place])   . Hepatitis C     Past Surgical History:  Procedure Laterality Date  . CARDIAC CATHETERIZATION      Prior to Admission medications   Not on File    Scheduled Meds: . folic acid  1 mg Oral Daily  . LORazepam  0-4 mg Intravenous Q6H   Followed by  . [START ON 06/22/2016] LORazepam  0-4 mg Intravenous Q12H  . multivitamin with minerals  1 tablet Oral Daily  . sodium chloride flush  3 mL Intravenous Q12H  . thiamine  100 mg Oral Daily   Or  . thiamine  100 mg Intravenous Daily   Continuous Infusions: . sodium chloride 50 mL/hr at 06/21/16 0746  . octreotide  (SANDOSTATIN)    IV infusion 50 mcg/hr (06/21/16 0555)  . pantoprozole (PROTONIX) infusion 8 mg/hr (06/21/16 0448)   PRN Meds:.acetaminophen **OR** acetaminophen, albuterol, LORazepam **OR** LORazepam, ondansetron **OR** ondansetron (ZOFRAN) IV, traMADol  Allergies as of 06/19/2016  . (No Known Allergies)    Family History  Problem Relation Age of Onset  . COPD Mother   . Diabetes  Sister     Social History   Social History  . Marital status: Single    Spouse name: N/A  . Number of children: N/A  . Years of education: N/A   Occupational History  . Not on file.   Social History Main Topics  . Smoking status: Current Every Day Smoker    Packs/day: 0.50    Types: Cigarettes  . Smokeless tobacco: Never Used     Comment: pt said he has all the information he needs   . Alcohol use 7.2 oz/week    12 Cans of beer per week     Comment: weekly/daily, pt report drinking 2 can in 2weeks  . Drug use: Yes    Types: "Crack" cocaine     Comment: Last dose Wednesday 06/18/2016  . Sexual activity: Not on file   Other Topics Concern  . Not on file   Social History Narrative  . No narrative on file    Review of Systems: All negative except as stated above in HPI.  Physical Exam: Vital signs: Vitals:   06/21/16 0330 06/21/16 0449  BP: 119/65   Pulse: 100   Resp: 20   Temp: (!) 102.3 F (39.1 C) (!) 100.5 F (38.1 C)   Last BM Date: 06/19/16 General:   Lethargic, thin, disheveled, no acute distress  HEENT: +scleral icterus, oropharynx clear Lungs:  Clear throughout to auscultation.   No wheezes, crackles, or rhonchi. No acute distress. Heart:  Regular rate and rhythm; no murmurs, clicks, rubs,  or gallops. Abdomen: soft, nontender, nondistended, +BS, small umbilical hernia  Rectal:  Deferred Ext: no edema, scaly dry skin on LE  GI:  Lab Results:  Recent Labs  06/19/16 1716  06/20/16 0858 06/20/16 1701 06/21/16 0042 06/21/16 0836  WBC 13.9*  --  7.8  --   --   --   HGB 11.2*  < > 9.0* 9.0* 8.9* 8.0*  HCT 34.9*  --  28.1*  --   --   --   PLT 310  --  169  --   --   --   < > = values in this interval not displayed. BMET  Recent Labs  06/19/16 1716 06/20/16 0858  NA 134* 138  K 3.6 4.1  CL 98* 109  CO2 30 26  GLUCOSE 246* 98  BUN 48* 36*  CREATININE 1.04 0.82  CALCIUM 8.0* 7.3*   LFT  Recent Labs  06/19/16 1716  PROT 6.8   ALBUMIN 2.1*  AST 78*  ALT 29  ALKPHOS 88  BILITOT 3.8*   PT/INR No results for input(s): LABPROT, INR in the last 72 hours.   Studies/Results: US Abdomen Limited Ruq  Result Date: 06/20/2016 CLINICAL DATA:  Hematemesis, abdominal pain. EXAM: US ABDOMEN LIMITED - RIGHT UPPER QUADRANT COMPARISON:  CT scan of May 19, 2016. Ultrasound of May 19, 2016. FINDINGS: Gallbladder: No gallstones are noted, but mild gallbladder wall thickening at 4 mm is noted. No pericholecystic fluid or sonographic Murphy's sign is noted. Common bile duct: Diameter: 4.8 mm which is within normal limits. Liver: Nodular hepatic contours are noted consistent with hepatic cirrhosis. At least 2 hypoechoic masses are noted, with the largest measuring 3.5 cm posteriorly in right hepatic lobe. These are concerning for metastatic disease. The distal portal vein appears to be thrombosed with hepatofugal flow seen proximally. IMPRESSION: Mild gallbladder wall thickening is noted most likely due to adjacent hepatocellular disease. Multiple hepatic masses are noted concerning for metastatic disease. Findings consistent with hepatic cirrhosis. Thrombosis of portal vein is noted. These results will be called to the ordering clinician or representative by the Radiologist Assistant, and communication documented in the PACS or zVision Dashboard. Electronically Signed   By: Marijo Conception, M.D.   On: 06/20/2016 10:50    Impression/Plan: 61 yo with Hep C/alcohol cirrhosis and CT findings concerning for hepatocellular carcinoma presenting with coffee grounds emesis and melena. Question peptic ulcer bleed vs portal gastropathy. Doubt variceal bleed or gastritis. EGD today to further evaluate. Continue Protonix drip, Octreotide drip. Start IV antibiotics due to GI bleeding in the setting of cirrhosis. NPO. Supportive care.    LOS: 2 days   Hudson C.  06/21/2016, 10:04 AM

## 2016-06-21 NOTE — Anesthesia Procedure Notes (Signed)
Procedure Name: Intubation Date/Time: 06/21/2016 3:55 PM Performed by: Jennette Bill Pre-anesthesia Checklist: Patient identified, Emergency Drugs available, Suction available, Patient being monitored and Timeout performed Patient Re-evaluated:Patient Re-evaluated prior to inductionOxygen Delivery Method: Circle system utilized Preoxygenation: Pre-oxygenation with 100% oxygen Intubation Type: IV induction Laryngoscope Size: Mac and 4 Grade View: Grade II Tube type: Oral Tube size: 7.5 mm Airway Equipment and Method: Patient positioned with wedge pillow and Stylet Placement Confirmation: ETT inserted through vocal cords under direct vision Secured at: 23 cm Dental Injury: Teeth and Oropharynx as per pre-operative assessment

## 2016-06-21 NOTE — Progress Notes (Signed)
[place] at [place] NAME: Donald Bowers    MR#:  [id number]  DATE OF BIRTH:  Feb 10, 1956  SUBJECTIVE:  Came in with coffee ground emesis and melena No more emesis Tolerated CLD y'day. NPO now  REVIEW OF SYSTEMS:   Review of Systems  Constitutional: Negative for chills, fever and weight loss.  HENT: Negative for ear discharge, ear pain and nosebleeds.   Eyes: Negative for blurred vision, pain and discharge.  Respiratory: Negative for sputum production, shortness of breath, wheezing and stridor.   Cardiovascular: Negative for chest pain, palpitations, orthopnea and PND.  Gastrointestinal: Positive for blood in stool, melena, nausea and vomiting. Negative for abdominal pain and diarrhea.  Genitourinary: Negative for frequency and urgency.  Musculoskeletal: Negative for back pain and joint pain.  Neurological: Positive for weakness. Negative for sensory change, speech change and focal weakness.  Psychiatric/Behavioral: Negative for depression and hallucinations. The patient is not nervous/anxious.    Tolerating Diet:npo Tolerating PT: pending  DRUG ALLERGIES:  No Known Allergies  VITALS:  Blood pressure 119/65, pulse 100, temperature (!) 100.5 F (38.1 C), temperature source Oral, resp. rate 20, height 6' (1.829 m), weight 85.4 kg (188 lb 3.2 oz), SpO2 100 %.  PHYSICAL EXAMINATION:   Physical Exam  GENERAL:  61 y.o.-year-old patient lying in the bed with no acute distress. Appears chronically ill EYES: Pupils equal, round, reactive to light and accommodation. No scleral icterus. Extraocular muscles intact.  HEENT: Head atraumatic, normocephalic. Oropharynx and nasopharynx clear. Pallor + NECK:  Supple, no jugular venous distention. No thyroid enlargement, no tenderness.  LUNGS: Normal breath sounds bilaterally, no wheezing, rales, rhonchi. No use of accessory muscles of respiration.  CARDIOVASCULAR: S1, S2 normal. No  murmurs, rubs, or gallops.  ABDOMEN: Soft, nontender, nondistended. Bowel sounds present. No organomegaly or mass.  EXTREMITIES: No cyanosis, clubbing or edema b/l.    NEUROLOGIC: Cranial nerves II through XII are intact. No focal Motor or sensory deficits b/l. No tremors  PSYCHIATRIC:  patient is alert and oriented x 3.  SKIN: No obvious rash, lesion, or ulcer.   LABORATORY PANEL:  CBC  Recent Labs Lab 06/20/16 0858  06/21/16 0042  WBC 7.8  --   --   HGB 9.0*  < > 8.9*  HCT 28.1*  --   --   PLT 169  --   --   < > = values in this interval not displayed.  Chemistries   Recent Labs Lab 06/19/16 1716 06/20/16 0858  NA 134* 138  K 3.6 4.1  CL 98* 109  CO2 30 26  GLUCOSE 246* 98  BUN 48* 36*  CREATININE 1.04 0.82  CALCIUM 8.0* 7.3*  AST 78*  --   ALT 29  --   ALKPHOS 88  --   BILITOT 3.8*  --    Cardiac Enzymes No results for input(s): TROPONINI in the last 168 hours. RADIOLOGY:  US Abdomen Limited Ruq  Result Date: 06/20/2016 CLINICAL DATA:  Hematemesis, abdominal pain. EXAM: US ABDOMEN LIMITED - RIGHT UPPER QUADRANT COMPARISON:  CT scan of May 19, 2016. Ultrasound of May 19, 2016. FINDINGS: Gallbladder: No gallstones are noted, but mild gallbladder wall thickening at 4 mm is noted. No pericholecystic fluid or sonographic Murphy's sign is noted. Common bile duct: Diameter: 4.8 mm which is within normal limits. Liver: Nodular hepatic contours are noted consistent with hepatic cirrhosis. At least 2 hypoechoic masses are noted, with the largest measuring 3.5 cm posteriorly  in right hepatic lobe. These are concerning for metastatic disease. The distal portal vein appears to be thrombosed with hepatofugal flow seen proximally. IMPRESSION: Mild gallbladder wall thickening is noted most likely due to adjacent hepatocellular disease. Multiple hepatic masses are noted concerning for metastatic disease. Findings consistent with hepatic cirrhosis. Thrombosis of portal vein is noted.  These results will be called to the ordering clinician or representative by the Radiologist Assistant, and communication documented in the PACS or zVision Dashboard. Electronically Signed   By: Marijo Conception, M.D.   On: 06/20/2016 10:50   ASSESSMENT AND PLAN:  Donald Bowers  is a 61 y.o. male who presents with Hematemesis and melena for the last 18-24 hours. He denies any abdominal pain.   1.coffee ground emesis/Hematemesis - suspect upper GI bleed, possibly variceal, the patient has no prior diagnosis of esophageal or gastric varices.  -cont IV PPI drip and octreotide drip  -GI consult today-spoke with Dr schooler -USG abdomen shows liver lesions and portal vein thrombosis -Oncology input appreciated. Dr Grayland Ormond recommends liver biopsy on monday -CT abd 3//18 shows liver mets and infiltrating possible  HCC to portal vein. -hgb 15--11--9.3--9.1--8.9  2. Chronic alcoholism (pt reports has drank only 2 beers in the last 2-3 weeks) -prn ativan  3. Cirrhosis of liver as noted on USG abd With h/o chronic alcoholism and h/o chronic hepatitis C  4. 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 Management plans discussed with the patient, family and they are in agreement.  CODE STATUS: Full  DVT Prophylaxis: SCD  TOTAL TIME TAKING CARE OF THIS PATIENT:  40 minutes.  >50% time spent on counselling and coordination of care  POSSIBLE D/C IN few DAYS, DEPENDING ON CLINICAL CONDITION.  Note: This dictation was prepared with Dragon dictation along with smaller phrase technology. Any transcriptional errors that result from this process are unintentional.  Josue Kass M.D on 06/21/2016 at 7:41 AM  Between 7am to 6pm - Pager - [phone]  After 6pm go to [email or web address] - password [email or web address]  [facility]  Office  [phone]  CC: Primary care physician; Dion Body, MD

## 2016-06-21 NOTE — Op Note (Signed)
[facility] Gastroenterology Patient Name: Donald Bowers Procedure Date: 06/21/2016 12:41 PM MRN: [id number] Account #: [id number] Date of Birth: Sep 03, 1955 Admit Type: Inpatient Age: 61 Room: [facility] ENDO ROOM 4 Gender: Male Note Status: Finalized Procedure:            Upper GI endoscopy Indications:          Suspected upper gastrointestinal bleeding, Hematemesis,                        Melena Providers:            Lear Ng, MD Referring MD:         Dion Body (Referring MD) Medicines:            Propofol per Anesthesia, Monitored Anesthesia Care Complications:        No immediate complications. Procedure:            Pre-Anesthesia Assessment:                       - Prior to the procedure, a History and Physical was                        performed, and patient medications and allergies were                        reviewed. The patient's tolerance of previous                        anesthesia was also reviewed. The risks and benefits of                        the procedure and the sedation options and risks were                        discussed with the patient. All questions were                        answered, and informed consent was obtained. Prior                        Anticoagulants: The patient has taken no previous                        anticoagulant or antiplatelet agents. ASA Grade                        Assessment: III - A patient with severe systemic                        disease. After reviewing the risks and benefits, the                        patient was deemed in satisfactory condition to undergo                        the procedure.                       After obtaining informed consent, the endoscope was  passed under direct vision. Throughout the procedure,                        the patient's blood pressure, pulse, and oxygen                        saturations were monitored continuously. The  Endoscope                        was introduced through the mouth, and advanced to the                        second part of duodenum. The upper GI endoscopy was                        accomplished without difficulty. The patient tolerated                        the procedure well. Findings:      Grade II varices were found in the middle third of the esophagus and in       the lower third of the esophagus.      Localized minimal inflammation characterized by congestion (edema) and       erythema was found in the prepyloric region of the stomach.      Mild portal hypertensive gastropathy was found in the gastric body.      The examined duodenum was normal. Impression:           - Grade II esophageal varices.                       - Acute gastritis.                       - Portal hypertensive gastropathy.                       - Normal examined duodenum.                       - No specimens collected. Recommendation:       - Advance diet as tolerated and clear liquid diet.                       - Observe patient's clinical course. Procedure Code(s):    --- Professional ---                       [phone], Esophagogastroduodenoscopy, flexible, transoral;                        diagnostic, including collection of specimen(s) by                        brushing or washing, when performed (separate procedure) Diagnosis Code(s):    --- Professional ---                       K92.0, Hematemesis                       K92.1, Melena (includes Hematochezia)  I85.00, Esophageal varices without bleeding                       K29.00, Acute gastritis without bleeding                       K76.6, Portal hypertension                       K31.89, Other diseases of stomach and duodenum CPT copyright 2016 American Medical Association. All rights reserved. The codes documented in this report are preliminary and upon coder review may  be revised to meet current compliance  requirements. Lear Ng, MD 06/21/2016 1:26:51 PM This report has been signed electronically. Number of Addenda: 0 Note Initiated On: 06/21/2016 12:41 PM      [facility]

## 2016-06-22 ENCOUNTER — Inpatient Hospital Stay: Payer: 59

## 2016-06-22 LAB — HEMOGLOBIN: Hemoglobin: 8.3 g/dL — ABNORMAL LOW (ref 13.0–18.0)

## 2016-06-22 MED ORDER — DEXTROSE 5 % IV SOLN
1.0000 g | INTRAVENOUS | Status: DC
  Administered 2016-06-22 – 2016-06-23 (×2): 1 g via INTRAVENOUS
  Filled 2016-06-22 (×3): qty 10

## 2016-06-22 MED ORDER — LORAZEPAM BOLUS VIA INFUSION: 0.5000 mg | Freq: Three times a day (TID) | INTRAVENOUS | Status: DC | PRN

## 2016-06-22 MED ORDER — LORAZEPAM 2 MG/ML IJ SOLN: 0.5000 mg | Freq: Three times a day (TID) | INTRAMUSCULAR | Status: DC | PRN

## 2016-06-22 MED ORDER — SODIUM CHLORIDE 0.9% FLUSH
3.0000 mL | INTRAVENOUS | Status: DC | PRN
  Administered 2016-06-22: 14:00:00 3 mL via INTRAVENOUS
  Filled 2016-06-22: qty 3

## 2016-06-22 NOTE — Progress Notes (Signed)
Gastroenterology Progress Note    Donald Bowers 61 y.o. [birth year]   Subjective: Intermittent abdominal pain. Black stool overnight. Denies N/V. Temp 102 overnight.  Objective: Vital signs in last 24 hours: Vitals:   06/22/16 0751 06/22/16 1250  BP: (!) 142/73 123/70  Pulse: 96 88  Resp: (!) 28 18  Temp: 99.8 F (37.7 C) 98 F (36.7 C)    Physical Exam: Gen: lethargic, no acute distress HEENT: +scleral icterus CV: RRR Chest: CTA B Abd: epigastric tenderness with guarding, soft, mild distention, +BS Ext: no edema  Lab Results:  Recent Labs  06/19/16 1716 06/20/16 0858  NA 134* 138  K 3.6 4.1  CL 98* 109  CO2 30 26  GLUCOSE 246* 98  BUN 48* 36*  CREATININE 1.04 0.82  CALCIUM 8.0* 7.3*    Recent Labs  06/19/16 1716  AST 78*  ALT 29  ALKPHOS 88  BILITOT 3.8*  PROT 6.8  ALBUMIN 2.1*    Recent Labs  06/19/16 1716  06/20/16 0858  06/21/16 1646 06/22/16 0042  WBC 13.9*  --  7.8  --   --   --   HGB 11.2*  < > 9.0*  < > 9.1* 8.3*  HCT 34.9*  --  28.1*  --   --   --   MCV 86.2  --  86.5  --   --   --   PLT 310  --  169  --   --   --   < > = values in this interval not displayed. No results for input(s): LABPROT, INR in the last 72 hours.    Assessment/Plan: Decompensated cirrhosis with hepatocellular carcinoma seen on CT scan with planned liver biopsy tomorrow per oncology. Black stool likely residual bleeding. Hgb 8.3 (9.1). Suspect bleeding was due to portal gastropathy. Fever may be due to liver tumor. Small ascites seen on U/S. Recheck AFP (last sample "diluted"). Supportive care. Tolerating low salt diet. NPO p MN in case rads wants him NPO for liver biopsy. Dr. Vicente Males to f/u tomorrow.   Donald C. 06/22/2016, 2:49 PMPatient ID: Donald Bowers, male   DOB: [birth year], 61 y.o.   MRN: [id number]

## 2016-06-22 NOTE — Progress Notes (Signed)
[place] at [place] NAME: Donald Bowers    MR#:  [id number]  DATE OF BIRTH:  Apr 15, 1955  SUBJECTIVE:  Came in with coffee ground emesis and melena No more emesis Tolerating CLD  Feels abd is distended. tachypneic. Had fever 102.3 last nite  REVIEW OF SYSTEMS:   Review of Systems  Constitutional: Negative for chills, fever and weight loss.  HENT: Negative for ear discharge, ear pain and nosebleeds.   Eyes: Negative for blurred vision, pain and discharge.  Respiratory: Negative for sputum production, shortness of breath, wheezing and stridor.   Cardiovascular: Negative for chest pain, palpitations, orthopnea and PND.  Gastrointestinal: Positive for blood in stool, melena, nausea and vomiting. Negative for abdominal pain and diarrhea.  Genitourinary: Negative for frequency and urgency.  Musculoskeletal: Negative for back pain and joint pain.  Neurological: Positive for weakness. Negative for sensory change, speech change and focal weakness.  Psychiatric/Behavioral: Negative for depression and hallucinations. The patient is not nervous/anxious.    Tolerating Diet:npo Tolerating PT: pending  DRUG ALLERGIES:  No Known Allergies  VITALS:  Blood pressure (!) 142/73, pulse 96, temperature 99.8 F (37.7 C), temperature source Oral, resp. rate (!) 28, height 6' (1.829 m), weight 85.3 kg (188 lb), SpO2 100 %.  PHYSICAL EXAMINATION:   Physical Exam  GENERAL:  61 y.o.-year-old patient lying in the bed with no acute distress. Appears chronically ill EYES: Pupils equal, round, reactive to light and accommodation. No scleral icterus. Extraocular muscles intact.  HEENT: Head atraumatic, normocephalic. Oropharynx and nasopharynx clear. Pallor + NECK:  Supple, no jugular venous distention. No thyroid enlargement, no tenderness.  LUNGS: Normal breath sounds bilaterally, no wheezing, rales, rhonchi. No use of accessory muscles of  respiration.  CARDIOVASCULAR: S1, S2 normal. No murmurs, rubs, or gallops.  ABDOMEN: Soft, nontender, nondistended. Bowel sounds present. No organomegaly or mass. Fluid thrill +EXTREMITIES: No cyanosis, clubbing or edema b/l.    NEUROLOGIC: Cranial nerves II through XII are intact. No focal Motor or sensory deficits b/l. No tremors  PSYCHIATRIC:  patient is alert and oriented x 3.  SKIN: No obvious rash, lesion, or ulcer.   LABORATORY PANEL:  CBC  Recent Labs Lab 06/20/16 0858  06/22/16 0042  WBC 7.8  --   --   HGB 9.0*  < > 8.3*  HCT 28.1*  --   --   PLT 169  --   --   < > = values in this interval not displayed.  Chemistries   Recent Labs Lab 06/19/16 1716 06/20/16 0858  NA 134* 138  K 3.6 4.1  CL 98* 109  CO2 30 26  GLUCOSE 246* 98  BUN 48* 36*  CREATININE 1.04 0.82  CALCIUM 8.0* 7.3*  AST 78*  --   ALT 29  --   ALKPHOS 88  --   BILITOT 3.8*  --    Cardiac Enzymes No results for input(s): TROPONINI in the last 168 hours. RADIOLOGY:  Dg Chest 1 View  Result Date: 06/22/2016 CLINICAL DATA:  Patient with history of ascites. Worsening shortness of breath. EXAM: CHEST 1 VIEW COMPARISON:  Chest radiograph 05/20/2016. FINDINGS: Low lung volumes. Stable prominent cardiac and mediastinal contours. Minimal heterogeneous opacities lung bases bilaterally. No pleural effusion or pneumothorax. Old right clavicle fracture. IMPRESSION: Low lung volumes with basilar heterogeneous opacities favored to represent atelectasis. Electronically Signed   By: Lovey Newcomer M.D.   On: 06/22/2016 09:30   US Abdomen Limited  Result  Date: 06/22/2016 CLINICAL DATA:  Evaluate for ascites EXAM: LIMITED ABDOMEN ULTRASOUND FOR ASCITES TECHNIQUE: Limited ultrasound survey for ascites was performed in all four abdominal quadrants. COMPARISON:  Ultrasound 06/20/2016 FINDINGS: Small volume ascites within the right upper, right lower and left lower quadrants. IMPRESSION: Small volume ascites within the  right upper, right lower and left lower quadrants. Electronically Signed   By: Lovey Newcomer M.D.   On: 06/22/2016 09:12   ASSESSMENT AND PLAN:  Donald Bowers  is a 61 y.o. male who presents with Hematemesis and melena for the last 18-24 hours. He denies any abdominal pain.   1.coffee ground emesis/Hematemesis - suspect upper GI bleed, possibly variceal, the patient has no prior diagnosis of esophageal or gastric varices.  -cont IV PPI drip and octreotide drip  -GI consult today-spoke with Dr Michail Sermon -EGD on April 22nd showed acute gastritis and grade II varices -USG abdomen shows liver lesions and portal vein thrombosis -Oncology input appreciated. Dr Grayland Ormond recommends liver biopsy on monday -CT abd 3//18 shows liver mets and infiltrating possible  HCC to portal vein. -hgb 15--11--9.3--9.1--8.9--8.0--9.3--8.3 -no vomiting D/c octreotide  2. Febrile illness UA positve for UTI USG abd trace fluid Get BC IV rocephin for UTI  3. Cirrhosis of liver as noted on USG abd With h/o chronic alcoholism and h/o chronic hepatitis C -repeat USG today does not show fluids  4. 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  Case discussed with Care Management/Social Worker. Management plans discussed with the patient, family and they are in agreement.  CODE STATUS: Full  DVT Prophylaxis: SCD  TOTAL TIME TAKING CARE OF THIS PATIENT:  40 minutes.  >50% time spent on counselling and coordination of care  POSSIBLE D/C IN few DAYS, DEPENDING ON CLINICAL CONDITION.  Note: This dictation was prepared with Dragon dictation along with smaller phrase technology. Any transcriptional errors that result from this process are  unintentional.  Leathie Weich M.D on 06/22/2016 at 11:59 AM  Between 7am to 6pm - Pager - [phone]  After 6pm go to [email or web address] - password [email or web address] [facility]  Office  [phone]  CC: Primary care physician; Dion Body, MD

## 2016-06-22 NOTE — Progress Notes (Addendum)
Continues to have intermittent fever, chills. Pt had abdominal distension, SOB at beginning of shift with ABD US done with small amount ascites found, Chest xray negative with pt reporting more comfortable now/respiration normal. Sleeps at frequent intervals; slightly more alert during wakeful times. Color remains pale/jaundiced. HGB 8.3 this a.m. No sign/symptom of active GI bleed. Pt reports 1 black stool last night, but none since then. Rocephin IV added with pt  on Cipro IV also. Tolerating carb modified/low salt diet in small amounts. IVF's and drip discontinued.

## 2016-06-23 ENCOUNTER — Inpatient Hospital Stay: Payer: 59

## 2016-06-23 ENCOUNTER — Encounter: Payer: Self-pay | Admitting: Gastroenterology

## 2016-06-23 DIAGNOSIS — K922 Gastrointestinal hemorrhage, unspecified: Secondary | ICD-10-CM

## 2016-06-23 DIAGNOSIS — R188 Other ascites: Secondary | ICD-10-CM

## 2016-06-23 LAB — AFP TUMOR MARKER

## 2016-06-23 LAB — PROTIME-INR
INR: 1.61
Prothrombin Time: 19.3 seconds — ABNORMAL HIGH (ref 11.4–15.2)

## 2016-06-23 LAB — CA 19-9 (SERIAL): CA 19-9: 45 U/mL — ABNORMAL HIGH (ref 0–35)

## 2016-06-23 MED ORDER — SUCRALFATE 1 GM/10ML PO SUSP
1.0000 g | Freq: Three times a day (TID) | ORAL | Status: DC
  Administered 2016-06-23 – 2016-06-24 (×4): 1 g via ORAL
  Filled 2016-06-23 (×4): qty 10

## 2016-06-23 MED ORDER — GADOXETATE DISODIUM 0.25 MMOL/ML IV SOLN
10.0000 mL | Freq: Once | INTRAVENOUS | Status: AC | PRN
  Administered 2016-06-23: 8 mL via INTRAVENOUS

## 2016-06-23 NOTE — Progress Notes (Signed)
Donald Bellows MD [address], [place], [place] [address] Phone: [phone] Fax : [phone]  Donald Bowers is being followed for GI bleed  Day 2 of follow up   Subjective:  Denies any hematemesis, has black tarry stool.    Objective: Vital signs in last 24 hours: Vitals:   06/22/16 0751 06/22/16 1250 06/22/16 2044 06/23/16 0413  BP: (!) 142/73 123/70 128/68 118/64  Pulse: 96 88 94 90  Resp: (!) 28 18 (!) 23 20  Temp: 99.8 F (37.7 C) 98 F (36.7 C) 99.4 F (37.4 C) 99.6 F (37.6 C)  TempSrc: Oral Oral Oral Oral  SpO2: 100% 100% 100% 96%  Weight:      Height:       Weight change:   Intake/Output Summary (Last 24 hours) at 06/23/16 0930 Last data filed at 06/23/16 3614  Gross per 24 hour  Intake              490 ml  Output             2080 ml  Net            -1590 ml     Exam: Heart:: Regular rate and rhythm, S1S2 present or without murmur or extra heart sounds Lungs: normal, clear to auscultation and clear to auscultation and percussion Abdomen: soft, nontender, normal bowel sounds, small reducible umbilical hernia    Lab Results: CBC Latest Ref Rng & Units 06/22/2016 06/21/2016 06/21/2016  WBC 3.8 - 10.6 K/uL - - -  Hemoglobin 13.0 - 18.0 g/dL 8.3(L) 9.1(L) 8.0(L)  Hematocrit 40.0 - 52.0 % - - -  Platelets 150 - 440 K/uL - - -    Micro Results: No results found for this or any previous visit (from the past 240 hour(s)). Studies/Results: Dg Chest 1 View  Result Date: 06/22/2016 CLINICAL DATA:  Patient with history of ascites. Worsening shortness of breath. EXAM: CHEST 1 VIEW COMPARISON:  Chest radiograph 05/20/2016. FINDINGS: Low lung volumes. Stable prominent cardiac and mediastinal contours. Minimal heterogeneous opacities lung bases bilaterally. No pleural effusion or pneumothorax. Old right clavicle fracture. IMPRESSION: Low lung volumes with basilar heterogeneous opacities favored to represent atelectasis. Electronically Signed   By: Lovey Newcomer M.D.   On: 06/22/2016 09:30   US Abdomen Limited  Result Date: 06/22/2016 CLINICAL DATA:  Evaluate for ascites EXAM: LIMITED ABDOMEN ULTRASOUND FOR ASCITES TECHNIQUE: Limited ultrasound survey for ascites was performed in all four abdominal quadrants. COMPARISON:  Ultrasound 06/20/2016 FINDINGS: Small volume ascites within the right upper, right lower and left lower quadrants. IMPRESSION: Small volume ascites within the right upper, right lower and left lower quadrants. Electronically Signed   By: Lovey Newcomer M.D.   On: 06/22/2016 09:12   Medications: I have reviewed the patient's current medications. Scheduled Meds: . folic acid  1 mg Oral Daily  . multivitamin with minerals  1 tablet Oral Daily  . pantoprazole (PROTONIX) IV  40 mg Intravenous Q12H  . sodium chloride flush  3 mL Intravenous Q12H  . thiamine  100 mg Oral Daily   Or  . thiamine  100 mg Intravenous Daily   Continuous Infusions: . cefTRIAXone (ROCEPHIN)  IV Stopped (06/22/16 1324)   PRN Meds:.acetaminophen **OR** acetaminophen, albuterol, LORazepam, ondansetron **OR** ondansetron (ZOFRAN) IV, sodium chloride flush, traMADol  Computed MELD-Na score unavailable. Necessary lab results were not found in the last year. Computed MELD score unavailable. Necessary lab results were not found in the last year.  Assessment: Principal Problem:   Hematemesis Active Problems:   Melena   COPD (chronic obstructive pulmonary disease) (HCC)   Hepatitis C   Liver mass   Donald Bowers 61 y.o. 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 H/o chronic hepatitis C . EGD 06/21/16 showed no active bleeding , grade 2 non bleeding esophageal varices, PHG.He is planned to undergo a liver biopsy today to evluate the mass, He also has portal vein thrombosis.  Being treated for an UTI and has fevers. USG abdomen from 06/22/16 shows small  qty ascites.    Plan: 1. Check INR to determine MELD score  2. Monitor CBC 3. If continues to drop Hb then may need to consider Tagged RBC scan vs colonoscopy .  4. Will add carafate .     LOS: 4 days   Donald Bowers 06/23/2016, 9:30 AM

## 2016-06-23 NOTE — Progress Notes (Signed)
[facility] at [place] NAME: Donald Bowers    MR#:  [id number]  DATE OF BIRTH:  1955-03-14  SUBJECTIVE:  Came in with coffee ground emesis and melena No more emesis Tolerating CLD  Feels abd is distended. tachypneic. Pt is afebrile  REVIEW OF SYSTEMS:   Review of Systems  Constitutional: Negative for chills, fever and weight loss.  HENT: Negative for ear discharge, ear pain and nosebleeds.   Eyes: Negative for blurred vision, pain and discharge.  Respiratory: Negative for sputum production, shortness of breath, wheezing and stridor.   Cardiovascular: Negative for chest pain, palpitations, orthopnea and PND.  Gastrointestinal: Positive for blood in stool, melena, nausea and vomiting. Negative for abdominal pain and diarrhea.  Genitourinary: Negative for frequency and urgency.  Musculoskeletal: Negative for back pain and joint pain.  Neurological: Positive for weakness. Negative for sensory change, speech change and focal weakness.  Psychiatric/Behavioral: Negative for depression and hallucinations. The patient is not nervous/anxious.    Tolerating Diet:npo Tolerating PT: pending  DRUG ALLERGIES:  No Known Allergies  VITALS:  Blood pressure 118/64, pulse 90, temperature 99.6 F (37.6 C), temperature source Oral, resp. rate 20, height 6' (1.829 m), weight 85.3 kg (188 lb), SpO2 96 %.  PHYSICAL EXAMINATION:   Physical Exam  GENERAL:  61 y.o.-year-old patient lying in the bed with no acute distress. Appears chronically ill EYES: Pupils equal, round, reactive to light and accommodation. No scleral icterus. Extraocular muscles intact.  HEENT: Head atraumatic, normocephalic. Oropharynx and nasopharynx clear. Pallor + NECK:  Supple, no jugular venous distention. No thyroid enlargement, no tenderness.  LUNGS: Normal breath sounds bilaterally, no wheezing, rales, rhonchi. No use of accessory muscles of respiration.  CARDIOVASCULAR:  S1, S2 normal. No murmurs, rubs, or gallops.  ABDOMEN: Soft, nontender, nondistended. Bowel sounds present. No organomegaly or mass. Fluid thrill +EXTREMITIES: No cyanosis, clubbing or edema b/l.    NEUROLOGIC: Cranial nerves II through XII are intact. No focal Motor or sensory deficits b/l. No tremors  PSYCHIATRIC:  patient is alert and oriented x 3.  SKIN: No obvious rash, lesion, or ulcer.   LABORATORY PANEL:  CBC  Recent Labs Lab 06/20/16 0858  06/22/16 0042  WBC 7.8  --   --   HGB 9.0*  < > 8.3*  HCT 28.1*  --   --   PLT 169  --   --   < > = values in this interval not displayed.  Chemistries   Recent Labs Lab 06/19/16 1716 06/20/16 0858  NA 134* 138  K 3.6 4.1  CL 98* 109  CO2 30 26  GLUCOSE 246* 98  BUN 48* 36*  CREATININE 1.04 0.82  CALCIUM 8.0* 7.3*  AST 78*  --   ALT 29  --   ALKPHOS 88  --   BILITOT 3.8*  --    Cardiac Enzymes No results for input(s): TROPONINI in the last 168 hours. RADIOLOGY:  Dg Chest 1 View  Result Date: 06/22/2016 CLINICAL DATA:  Patient with history of ascites. Worsening shortness of breath. EXAM: CHEST 1 VIEW COMPARISON:  Chest radiograph 05/20/2016. FINDINGS: Low lung volumes. Stable prominent cardiac and mediastinal contours. Minimal heterogeneous opacities lung bases bilaterally. No pleural effusion or pneumothorax. Old right clavicle fracture. IMPRESSION: Low lung volumes with basilar heterogeneous opacities favored to represent atelectasis. Electronically Signed   By: Donald Bowers Bowers.   On: 06/22/2016 09:30   US Abdomen Limited  Result Date: 06/22/2016 CLINICAL DATA:  Evaluate for ascites EXAM: LIMITED ABDOMEN ULTRASOUND FOR ASCITES TECHNIQUE: Limited ultrasound survey for ascites was performed in all four abdominal quadrants. COMPARISON:  Ultrasound 06/20/2016 FINDINGS: Small volume ascites within the right upper, right lower and left lower quadrants. IMPRESSION: Small volume ascites within the right upper, right lower and left  lower quadrants. Electronically Signed   By: Donald Bowers Bowers.   On: 06/22/2016 09:12   ASSESSMENT AND PLAN:  Donald Bowers  is a 61 y.o. male who presents with Hematemesis and melena for the last 18-24 hours. He denies any abdominal pain.   1.coffee ground emesis/Hematemesis - suspect upper GI bleed, possibly variceal, the patient has no prior diagnosis of esophageal or gastric varices.  -cont IV PPI drip and octreotide drip  -GI consult today-spoke with Donald Bowers -EGD on April 22nd showed acute gastritis and grade II varices -USG abdomen shows liver lesions and portal vein thrombosis -Oncology input appreciated. Donald Bowers recommends liver biopsy on Monday -spoke with Donald Bowers IR --- recommends dedicated MR liver with contrast -CT abd 3//18 shows liver mets and infiltrating possible  HCC to portal vein. -hgb 15--11--9.3--9.1--8.9--8.0--9.3--8.3 -no vomiting D/c octreotide  2. Febrile illness UA positve for UTI USG abd trace fluid Get BC IV rocephin for UTI  3. Cirrhosis of liver as noted on USG abd With h/o chronic alcoholism and h/o chronic hepatitis C -repeat USG today does not show fluids  4. 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  Left message for son Case discussed with Care Management/Social Worker. Management plans discussed with the patient, family and they are in agreement.  CODE STATUS: Full  DVT Prophylaxis: SCD  TOTAL TIME TAKING CARE OF THIS PATIENT:  40 minutes.  >50% time spent on counselling and coordination of care  POSSIBLE D/C IN few DAYS, DEPENDING ON CLINICAL CONDITION.  Note: This dictation was prepared with Dragon dictation along with smaller phrase technology. Any  transcriptional errors that result from this process are unintentional.  Donald Bowers on 06/23/2016 at 11:39 AM  Between 7am to 6pm - Pager - [phone]  After 6pm go to [email or web address] - password [email or web address] [facility]  Office  [phone]  CC: Primary care physician; Dion Body, MD

## 2016-06-24 MED ORDER — CEPHALEXIN 500 MG PO CAPS
500.0000 mg | ORAL_CAPSULE | Freq: Two times a day (BID) | ORAL | 0 refills | Status: DC
Start: 2016-06-24 — End: 2016-07-02

## 2016-06-24 MED ORDER — FOLIC ACID 1 MG PO TABS: 1.0000 mg | ORAL_TABLET | Freq: Every day | ORAL | 0 refills | Status: DC

## 2016-06-24 MED ORDER — TRAMADOL HCL 50 MG PO TABS: 50.0000 mg | ORAL_TABLET | Freq: Four times a day (QID) | ORAL | 0 refills | Status: DC | PRN

## 2016-06-24 MED ORDER — CEPHALEXIN 500 MG PO CAPS
500.0000 mg | ORAL_CAPSULE | Freq: Two times a day (BID) | ORAL | Status: DC
  Administered 2016-06-24: 500 mg via ORAL
  Filled 2016-06-24: qty 1

## 2016-06-24 MED ORDER — ADULT MULTIVITAMIN W/MINERALS CH
1.0000 | ORAL_TABLET | Freq: Every day | ORAL | 0 refills | Status: DC
Start: 2016-06-24 — End: 2016-08-07

## 2016-06-24 MED ORDER — PANTOPRAZOLE SODIUM 40 MG PO TBEC
40.0000 mg | DELAYED_RELEASE_TABLET | Freq: Every day | ORAL | 0 refills | Status: DC
Start: 2016-06-24 — End: 2016-07-29

## 2016-06-24 MED ORDER — PANTOPRAZOLE SODIUM 40 MG PO TBEC
40.0000 mg | DELAYED_RELEASE_TABLET | Freq: Every day | ORAL | Status: DC
  Administered 2016-06-24: 40 mg via ORAL
  Filled 2016-06-24: qty 1

## 2016-06-24 MED ORDER — SUCRALFATE 1 GM/10ML PO SUSP: 1.0000 g | Freq: Three times a day (TID) | ORAL | 0 refills | Status: DC

## 2016-06-24 NOTE — Discharge Summary (Signed)
[facility] at [place] NAME: Vikash Nest    MR#:  [id number]  DATE OF BIRTH:  1955/03/22  DATE OF ADMISSION:  06/19/2016 ADMITTING PHYSICIAN: Lance Coon, MD  DATE OF DISCHARGE:   PRIMARY CARE PHYSICIAN: Dion Body, MD    ADMISSION DIAGNOSIS:  Gastrointestinal hemorrhage, unspecified gastrointestinal hemorrhage type [K92.2]  DISCHARGE DIAGNOSIS:  GI bleed due to Acute Gastritis Acute blood loss anemia due to upper GI bleed -Liver mass (new) w/u now at the cancer center h/o ETOH abuse  SECONDARY DIAGNOSIS:   Past Medical History:  Diagnosis Date  . Cocaine abuse   . COPD (chronic obstructive pulmonary disease) ([place])   . ETOH abuse   . GI bleed   . Hepatitis C     HOSPITAL COURSE:  PhillipYarbroughis a 61 y.o.malewho presents with Hematemesis and melena for the last 18-24 hours. He denies any abdominal pain.   1.coffee ground emesis/Hematemesis - suspect upper GI bleed, possibly variceal, the patient has no prior diagnosis of esophageal or gastric varices.  -cont IV PPI drip and octreotide drip  -GI consulted-spoke with Dr Michail Sermon -EGD on April 22nd showed acute gastritis and grade II varices -USG abdomen shows liver lesions and portal vein thrombosis -Oncology input appreciated. Dr Grayland Ormond recommends liver biopsy at f/u in the cancer center -spoke with Dr Jarvis Newcomer IR --- recommends dedicated MR liver with contrast -CT abd 3//18 shows liver mets and infiltrating possible  HCC to portal vein. -hgb 15--11--9.3--9.1--8.9--8.0--9.3--8.3 -no vomiting D/c octreotide  2. Febrile illness UA positve for UTI USG abd trace fluid Received IV rocephin for UTI  3. Cirrhosis of liver as noted on USG abd With h/o chronic alcoholism and h/o chronic hepatitis C -repeat USG today does not show fluids  4. 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 Above was d/w pt's son and pt  CONSULTS OBTAINED:  Treatment Team:  Lloyd Huger, MD Jonathon Bellows, MD  DRUG ALLERGIES:  No Known Allergies  DISCHARGE MEDICATIONS:   Current Discharge Medication List    START taking these medications   Details  cephALEXin (KEFLEX) 500 MG capsule Take 1 capsule (500 mg total) by mouth every 12 (twelve) hours. Qty: 4 capsule, Refills: 0    folic acid (FOLVITE) 1 MG tablet Take 1 tablet (1 mg total) by mouth daily. Qty: 30 tablet, Refills: 0    Multiple Vitamin (MULTIVITAMIN WITH MINERALS) TABS tablet Take 1 tablet by mouth daily. Qty: 30 tablet, Refills: 0    pantoprazole (PROTONIX) 40 MG tablet Take 1 tablet (40 mg total) by mouth daily. Qty: 30 tablet, Refills: 0    sucralfate (CARAFATE) 1 GM/10ML suspension Take 10 mLs (1 g total) by mouth 4 (four) times daily -  with meals and at bedtime. Qty: 420 mL, Refills: 0    traMADol (ULTRAM) 50 MG tablet Take 1 tablet (50 mg total) by mouth every 6 (six) hours as needed for moderate pain. Qty: 30 tablet, Refills: 0      STOP taking these medications     albuterol (PROVENTIL HFA;VENTOLIN HFA) 108 (90 Base) MCG/ACT inhaler         If you experience worsening of your admission symptoms, develop shortness of  breath, life threatening emergency, suicidal or homicidal thoughts you must seek medical attention immediately by calling 911 or calling your MD immediately  if symptoms less severe.  You Must read complete instructions/literature along with all the possible adverse reactions/side effects for all the Medicines you take and that have been prescribed to you. Take any new Medicines after you have completely understood and accept all the possible adverse reactions/side effects.    Please note  You were cared for by a hospitalist during your hospital stay. If you have any questions about your discharge medications or the care you received while you were in the hospital after you are discharged, you can call the unit and asked to speak with the hospitalist on call if the hospitalist that took care of you is not available. Once you are discharged, your primary care physician will handle any further medical issues. Please note that NO REFILLS for any discharge medications will be authorized once you are discharged, as it is imperative that you return to your primary care physician (or establish a relationship with a primary care physician if you do not have one) for your aftercare needs so that they can reassess your need for medications and monitor your lab values. Today   SUBJECTIVE     VITAL SIGNS:  Blood pressure (!) 112/59, pulse 93, temperature 98.2 F (36.8 C), temperature source Oral, resp. rate 18, height 6' (1.829 m), weight 85.3 kg (188 lb), SpO2 98 %.  I/O:   Intake/Output Summary (Last 24 hours) at 06/24/16 0816 Last data filed at 06/24/16 8938  Gross per 24 hour  Intake              290 ml  Output             1750 ml  Net            -1460 ml    PHYSICAL EXAMINATION:  GENERAL:  61 y.o.-year-old patient lying in the bed with no acute distress.  EYES: Pupils equal, round, reactive to light and accommodation. No scleral icterus. Extraocular muscles intact.  HEENT: Head atraumatic, normocephalic. Oropharynx and nasopharynx clear.  NECK:  Supple, no jugular venous distention. No thyroid enlargement, no tenderness.  LUNGS: Normal breath sounds bilaterally, no wheezing, rales,rhonchi or crepitation. No use of accessory muscles of respiration.  CARDIOVASCULAR: S1, S2 normal. No murmurs, rubs, or gallops.  ABDOMEN: Soft, non-tender, non-distended. Bowel sounds present. No organomegaly or mass.  EXTREMITIES: No pedal edema, cyanosis, or clubbing.   NEUROLOGIC: Cranial nerves II through XII are intact. Muscle strength 5/5 in all extremities. Sensation intact. Gait not checked.  PSYCHIATRIC: The patient is alert and oriented x 3.  SKIN: No obvious rash, lesion, or ulcer.   DATA REVIEW:   CBC   Recent Labs Lab 06/20/16 0858  06/22/16 0042  WBC 7.8  --   --   HGB 9.0*  < > 8.3*  HCT 28.1*  --   --   PLT 169  --   --   < > = values in this interval not displayed.  Chemistries   Recent Labs Lab 06/19/16 1716 06/20/16 0858  NA 134* 138  K 3.6 4.1  CL 98* 109  CO2 30 26  GLUCOSE 246* 98  BUN 48* 36*  CREATININE 1.04 0.82  CALCIUM 8.0* 7.3*  AST 78*  --   ALT 29  --   ALKPHOS 88  --   BILITOT 3.8*  --     Microbiology Results  No results found for this or any previous visit (from the past 240 hour(s)).  RADIOLOGY:  Dg Chest 1 View  Result Date: 06/22/2016 CLINICAL DATA:  Patient with history of ascites. Worsening shortness of breath. EXAM: CHEST 1 VIEW COMPARISON:  Chest radiograph 05/20/2016. FINDINGS: Low lung volumes. Stable prominent cardiac and mediastinal contours. Minimal heterogeneous opacities lung bases bilaterally. No pleural effusion or pneumothorax. Old right clavicle fracture. IMPRESSION: Low lung volumes with basilar heterogeneous opacities favored to represent atelectasis. Electronically Signed   By: Lovey Newcomer M.D.   On: 06/22/2016 09:30   Mr Liver W Wo Contrast  Result Date: 06/23/2016 CLINICAL DATA:  Hepatitis C, nausea/ vomiting, abnormal liver on prior CT EXAM: MRI ABDOMEN WITHOUT AND WITH CONTRAST TECHNIQUE: Multiplanar multisequence MR imaging of the abdomen was performed both before and after the administration of intravenous contrast. CONTRAST:  8 mL Eovist IV COMPARISON:  CT abdomen/pelvis dated 05/19/2016 FINDINGS: Motion degraded images. Lower chest: Small right pleural effusion. Associated right lower lobe atelectasis. Hepatobiliary: Cirrhosis. 4.7 cm heterogeneously enhancing lesion in  segment 4A (series 6/image 14), highly suspicious for HCC. Additional 13.2 cm heterogeneously enhancing lesion centered in segment 7 (series 6/ image 32), also highly suspicious for HCC with associated right portal vein thrombosis (series 10/ image 37). Gallbladder is notable for layering sludge (series 15/image 18). No associated inflammatory changes. Pancreas:  Grossly unremarkable. Spleen: Enlarged, measuring 16.0 cm in maximal craniocaudal dimension. Adrenals/Urinary Tract:  Adrenal glands are within normal limits. Kidneys are grossly unremarkable.  No hydronephrosis. Stomach/Bowel: Stomach and visualized bowel are grossly unremarkable. Vascular/Lymphatic: Thrombus extends into the main portal vein (series 17/image 21) and to the portosplenic confluence (series 17/ image 39) and proximal SMV (series 17/image 48). Gastroesophageal varices.  Recanalized periumbilical vein. Small upper abdominal lymph nodes, likely reactive. Other:  Small volume abdominal ascites. Musculoskeletal: No focal osseous lesions. IMPRESSION: Motion degraded images, limiting evaluation. Two dominant lesions in segment 4A and segment 7, as described above, suspicious for multifocal HCC. 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 Electronically Signed   By: Julian Hy M.D.   On: 06/23/2016 13:29   US Abdomen Limited  Result Date: 06/22/2016 CLINICAL DATA:  Evaluate for ascites EXAM: LIMITED ABDOMEN ULTRASOUND FOR ASCITES TECHNIQUE: Limited ultrasound survey for ascites was performed in all four abdominal quadrants. COMPARISON:  Ultrasound 06/20/2016 FINDINGS: Small volume ascites within the right upper, right lower and left lower quadrants. IMPRESSION: Small volume ascites within the right upper, right lower and left lower quadrants. Electronically Signed   By: Lovey Newcomer M.D.   On: 06/22/2016 09:12     Management plans discussed with the  patient, family and they are in agreement.  CODE STATUS:     Code Status Orders        Start     Ordered   06/20/16 0256  Full code  Continuous     06/20/16 0255    Code Status History    Date Active Date Inactive Code Status Order ID Comments User Context   05/19/2016  9:16 PM 05/22/2016  3:03 PM Full Code [id number]  Fritzi Mandes, MD Inpatient      TOTAL TIME TAKING CARE OF THIS PATIENT: *40* minutes.    Laurina Fischl M.D on 06/24/2016 at 8:16 AM  Between 7am to 6pm - Pager - [phone] After 6pm go to [email or web address] - password [email or web address] [facility]  Office  [phone]  CC: Primary care physician;  Dion Body, MD

## 2016-06-24 NOTE — Progress Notes (Signed)
Pt d/ced home - to be followed by [facility].  He has been afebrile more than 24 hours.  No hematemesis.  He is tolerating diet.  Has been having difficulty going to the bathroom.  Educated that he needs to start a bowel regimen if he is going to take pain medication regularly.  Will go home on carafate and protonix.  Will also encourage a heart healthy diet with no alcohol consumption. Pt will go home with son - reviewed d/c instructions and pt will need to schedule appt w[facility].  Nurse Tech removed IV.

## 2016-06-25 LAB — URINE CULTURE: Culture: >=100000 — AB

## 2016-06-27 ENCOUNTER — Ambulatory Visit: Payer: 59 | Admitting: Oncology

## 2016-06-30 NOTE — Progress Notes (Signed)
Donald Bowers  [phone] [phone]  ID: Elenore Paddy OB: 1955/06/18  MR#: [id number]  XLK#:[id number]  Patient Care Team: Dion Body, MD as PCP - General (Family Medicine) [name], RN as Registered Nurse  CHIEF COMPLAINT:  Liver mass.  INTERVAL HISTORY: Patient is a 61 year old male who was initially evaluated in the hospital admission for hemoptysis and GI bleed. He was noted to have a large liver mass highly suspicious for underlying hepatocellular carcinoma. Patient continues to feel weak and fatigued. He denies any further hemoptysis or bleeding. He has no neurologic complaints. He denies any recent fevers. He has a poor appetite, but denies weight loss. He has no chest pain or shortness of breath. He denies any nausea, vomiting, constipation, or diarrhea. He has no melena or hematochezia. He complains of abdominal bloating and lower extremity swelling. Patient offers no further specific complaints today.  REVIEW OF SYSTEMS:   Review of Systems  Constitutional: Positive for malaise/fatigue. Negative for fever and weight loss.  Respiratory: Negative.  Negative for cough and shortness of breath.   Cardiovascular: Positive for leg swelling. Negative for chest pain.  Gastrointestinal: Positive for abdominal pain. Negative for blood in stool, constipation, diarrhea, melena, nausea and vomiting.  Genitourinary: Negative.   Musculoskeletal: Negative.   Skin: Negative.  Negative for rash.  Neurological: Positive for weakness. Negative for sensory change.  Endo/Heme/Allergies: Negative.  Does not bruise/bleed easily.  Psychiatric/Behavioral: Positive for substance abuse. The patient is not nervous/anxious.     As per HPI. Otherwise, a complete review of systems is negative.  PAST MEDICAL HISTORY: Past Medical History:  Diagnosis Date  . Cocaine abuse   . COPD (chronic obstructive pulmonary disease) ([place])   . ETOH abuse   .  GI bleed   . Hepatitis C     PAST SURGICAL HISTORY: Past Surgical History:  Procedure Laterality Date  . CARDIAC CATHETERIZATION    . ESOPHAGOGASTRODUODENOSCOPY (EGD) WITH PROPOFOL N/A 06/21/2016   Procedure: ESOPHAGOGASTRODUODENOSCOPY (EGD) WITH PROPOFOL;  Surgeon: Wilford Corner, MD;  Location: [facility] ENDOSCOPY;  Service: Endoscopy;  Laterality: N/A;    FAMILY HISTORY: Family History  Problem Relation Age of Onset  . COPD Mother   . Diabetes Sister   . Breast cancer Sister   . Cancer Father     back cancer  . Breast cancer Maternal Aunt   . Breast cancer Paternal Uncle     ADVANCED DIRECTIVES (Y/N):  N  HEALTH MAINTENANCE: Social History  Substance Use Topics  . Smoking status: Current Every Day Smoker    Packs/day: 0.50    Types: Cigarettes  . Smokeless tobacco: Never Used     Comment: pt said he has all the information he needs   . Alcohol use 7.2 oz/week    12 Cans of beer per week     Comment: weekly/daily, pt report drinking 2 can in 2weeks     Colonoscopy:  PAP:  Bone density:  Lipid panel:  No Known Allergies  Current Outpatient Prescriptions  Medication Sig Dispense Refill  . folic acid (FOLVITE) 1 MG tablet Take 1 tablet (1 mg total) by mouth daily. 30 tablet 0  . Multiple Vitamin (MULTIVITAMIN WITH MINERALS) TABS tablet Take 1 tablet by mouth daily. 30 tablet 0  . pantoprazole (PROTONIX) 40 MG tablet Take 1 tablet (40 mg total) by mouth daily. 30 tablet 0  . sucralfate (CARAFATE) 1 GM/10ML suspension Take 10 mLs (1 g total) by mouth 4 (four) times daily -  with meals and at bedtime. 420 mL 0  . traMADol (ULTRAM) 50 MG tablet Take 1 tablet (50 mg total) by mouth every 6 (six) hours as needed for moderate pain. 30 tablet 0  . furosemide (LASIX) 20 MG tablet Take 1 tablet (20 mg total) by mouth daily. 30 tablet 0  . OxyCODONE HCl, Abuse Deter, (OXAYDO) 5 MG TABA Take 5 mg by mouth 2 (two) times daily as needed. 30 tablet 0   No current  facility-administered medications for this visit.     OBJECTIVE: Vitals:   07/02/16 1052  BP: 129/73  Pulse: (!) 105  Temp: 100.2 F (37.9 C)     There is no height or weight on file to calculate BMI.    ECOG FS:2 - Symptomatic, <50% confined to bed  General: Well-developed, well-nourished, no acute distress. Eyes: Pink conjunctiva, icteric sclera. HEENT: Normocephalic, moist mucous membranes, clear oropharnyx. Lungs: Clear to auscultation bilaterally. Heart: Regular rate and rhythm. No rubs, murmurs, or gallops. Abdomen: Mildly distended, nontender. Musculoskeletal: No edema, cyanosis, or clubbing. Neuro: Alert, answering all questions appropriately. Cranial nerves grossly intact. Skin: No rashes or petechiae noted. Jaundiced.  Psych: Normal affect. Lymphatics: No cervical, calvicular, axillary or inguinal LAD.   LAB RESULTS:  Lab Results  Component Value Date   NA 126 (L) 07/02/2016   K 3.8 07/02/2016   CL 98 (L) 07/02/2016   CO2 25 07/02/2016   GLUCOSE 174 (H) 07/02/2016   BUN 7 07/02/2016   CREATININE 0.63 07/02/2016   CALCIUM 7.7 (L) 07/02/2016   PROT 7.2 07/02/2016   ALBUMIN 1.8 (L) 07/02/2016   AST 54 (H) 07/02/2016   ALT 22 07/02/2016   ALKPHOS 141 (H) 07/02/2016   BILITOT 4.2 (H) 07/02/2016   GFRNONAA >60 07/02/2016   GFRAA >60 07/02/2016    Lab Results  Component Value Date   WBC 9.6 07/02/2016   NEUTROABS 8.3 (H) 07/02/2016   HGB 8.1 (L) 07/02/2016   HCT 24.9 (L) 07/02/2016   MCV 85.8 07/02/2016   PLT 296 07/02/2016     STUDIES: Dg Chest 1 View  Result Date: 06/22/2016 CLINICAL DATA:  Patient with history of ascites. Worsening shortness of breath. EXAM: CHEST 1 VIEW COMPARISON:  Chest radiograph 05/20/2016. FINDINGS: Low lung volumes. Stable prominent cardiac and mediastinal contours. Minimal heterogeneous opacities lung bases bilaterally. No pleural effusion or pneumothorax. Old right clavicle fracture. IMPRESSION: Low lung volumes with basilar  heterogeneous opacities favored to represent atelectasis. Electronically Signed   By: Lovey Newcomer M.D.   On: 06/22/2016 09:30   Mr Liver W Wo Contrast  Result Date: 06/23/2016 CLINICAL DATA:  Hepatitis C, nausea/ vomiting, abnormal liver on prior CT EXAM: MRI ABDOMEN WITHOUT AND WITH CONTRAST TECHNIQUE: Multiplanar multisequence MR imaging of the abdomen was performed both before and after the administration of intravenous contrast. CONTRAST:  8 mL Eovist IV COMPARISON:  CT abdomen/pelvis dated 05/19/2016 FINDINGS: Motion degraded images. Lower chest: Small right pleural effusion. Associated right lower lobe atelectasis. Hepatobiliary: Cirrhosis. 4.7 cm heterogeneously enhancing lesion in segment 4A (series 6/image 14), highly suspicious for HCC. Additional 13.2 cm heterogeneously enhancing lesion centered in segment 7 (series 6/ image 32), also highly suspicious for HCC with associated right portal vein thrombosis (series 10/ image 37). Gallbladder is notable for layering sludge (series 15/image 18). No associated inflammatory changes. Pancreas:  Grossly unremarkable. Spleen: Enlarged, measuring 16.0 cm in maximal craniocaudal dimension. Adrenals/Urinary Tract:  Adrenal glands are within normal limits. Kidneys are grossly unremarkable.  No  hydronephrosis. Stomach/Bowel: Stomach and visualized bowel are grossly unremarkable. Vascular/Lymphatic: Thrombus extends into the main portal vein (series 17/image 21) and to the portosplenic confluence (series 17/ image 39) and proximal SMV (series 17/image 48). Gastroesophageal varices.  Recanalized periumbilical vein. Small upper abdominal lymph nodes, likely reactive. Other:  Small volume abdominal ascites. Musculoskeletal: No focal osseous lesions. IMPRESSION: Motion degraded images, limiting evaluation. Two dominant lesions in segment 4A and segment 7, as described above, suspicious for multifocal HCC. Associated portal vein thrombosis, suspicious for tumor thrombus.  Cirrhosis with stigmata of portal hypertension, as above. Electronically Signed   By: Julian Hy M.D.   On: 06/23/2016 13:29   US Abdomen Limited  Result Date: 06/22/2016 CLINICAL DATA:  Evaluate for ascites EXAM: LIMITED ABDOMEN ULTRASOUND FOR ASCITES TECHNIQUE: Limited ultrasound survey for ascites was performed in all four abdominal quadrants. COMPARISON:  Ultrasound 06/20/2016 FINDINGS: Small volume ascites within the right upper, right lower and left lower quadrants. IMPRESSION: Small volume ascites within the right upper, right lower and left lower quadrants. Electronically Signed   By: Lovey Newcomer M.D.   On: 06/22/2016 09:12   US Abdomen Limited Ruq  Result Date: 06/20/2016 CLINICAL DATA:  Hematemesis, abdominal pain. EXAM: US ABDOMEN LIMITED - RIGHT UPPER QUADRANT COMPARISON:  CT scan of May 19, 2016. Ultrasound of May 19, 2016. FINDINGS: Gallbladder: No gallstones are noted, but mild gallbladder wall thickening at 4 mm is noted. No pericholecystic fluid or sonographic Murphy's sign is noted. Common bile duct: Diameter: 4.8 mm which is within normal limits. Liver: Nodular hepatic contours are noted consistent with hepatic cirrhosis. At least 2 hypoechoic masses are noted, with the largest measuring 3.5 cm posteriorly in right hepatic lobe. These are concerning for metastatic disease. The distal portal vein appears to be thrombosed with hepatofugal flow seen proximally. IMPRESSION: Mild gallbladder wall thickening is noted most likely due to adjacent hepatocellular disease. Multiple hepatic masses are noted concerning for metastatic disease. Findings consistent with hepatic cirrhosis. Thrombosis of portal vein is noted. These results will be called to the ordering clinician or representative by the Radiologist Assistant, and communication documented in the PACS or zVision Dashboard. Electronically Signed   By: Marijo Conception, M.D.   On: 06/20/2016 10:50    ASSESSMENT:  Liver  mass  PLAN:    1.  Liver mass: Highly suspicious for underlying hepatocellular carcinoma given his history of alcoholism and hepatitis C. Patient's AFP is also greater than the 170,000. Ultrasound-guided liver biopsy has been scheduled to confirm the diagnosis. Given patient's poor performance status as well as elevated bilirubin, treatment may be difficult. Return to clinic several days after his biopsy to discuss the results and treatment planning if possible. 2. Portal vein thrombosis: Given patient's recent GI bleed cannot anticoagulate. Monitor. 3. GI bleed: Likely secondary to varices. Continue follow-up with GI as indicated. 4. Anemia: Secondary to GI bleed, monitor. 5. Hyperbilirubinemia: Secondary to poor liver function and underlying tonsillar carcinoma. Monitor. 6. Abdominal bloating/edema: Patient was given a prescription for Lasix. 7. Pain: Patient was given a prescription for oxycodone.  Patient expressed understanding and was in agreement with this plan. He also understands that He can call clinic at any time with any questions, concerns, or complaints.   Cancer Staging No matching staging information was found for the patient.  Lloyd Huger, MD   07/04/2016 11:11 AM

## 2016-07-02 ENCOUNTER — Ambulatory Visit: Payer: 59 | Admitting: Oncology

## 2016-07-02 ENCOUNTER — Inpatient Hospital Stay: Payer: 59

## 2016-07-02 ENCOUNTER — Inpatient Hospital Stay: Payer: 59 | Attending: Oncology | Admitting: Oncology

## 2016-07-02 ENCOUNTER — Encounter: Payer: Self-pay | Admitting: Oncology

## 2016-07-02 VITALS — BP 129/73 | HR 105 | Temp 100.2°F

## 2016-07-02 DIAGNOSIS — R531 Weakness: Secondary | ICD-10-CM | POA: Diagnosis not present

## 2016-07-02 DIAGNOSIS — R160 Hepatomegaly, not elsewhere classified: Secondary | ICD-10-CM | POA: Insufficient documentation

## 2016-07-02 DIAGNOSIS — D649 Anemia, unspecified: Secondary | ICD-10-CM | POA: Diagnosis not present

## 2016-07-02 DIAGNOSIS — I81 Portal vein thrombosis: Secondary | ICD-10-CM | POA: Diagnosis not present

## 2016-07-02 DIAGNOSIS — R140 Abdominal distension (gaseous): Secondary | ICD-10-CM | POA: Diagnosis not present

## 2016-07-02 DIAGNOSIS — K7460 Unspecified cirrhosis of liver: Secondary | ICD-10-CM | POA: Insufficient documentation

## 2016-07-02 DIAGNOSIS — R109 Unspecified abdominal pain: Secondary | ICD-10-CM | POA: Diagnosis not present

## 2016-07-02 DIAGNOSIS — J90 Pleural effusion, not elsewhere classified: Secondary | ICD-10-CM

## 2016-07-02 DIAGNOSIS — Z803 Family history of malignant neoplasm of breast: Secondary | ICD-10-CM | POA: Insufficient documentation

## 2016-07-02 DIAGNOSIS — K922 Gastrointestinal hemorrhage, unspecified: Secondary | ICD-10-CM | POA: Diagnosis not present

## 2016-07-02 DIAGNOSIS — J449 Chronic obstructive pulmonary disease, unspecified: Secondary | ICD-10-CM | POA: Diagnosis not present

## 2016-07-02 DIAGNOSIS — K920 Hematemesis: Secondary | ICD-10-CM | POA: Diagnosis not present

## 2016-07-02 DIAGNOSIS — R5383 Other fatigue: Secondary | ICD-10-CM | POA: Diagnosis not present

## 2016-07-02 DIAGNOSIS — B1920 Unspecified viral hepatitis C without hepatic coma: Secondary | ICD-10-CM | POA: Insufficient documentation

## 2016-07-02 DIAGNOSIS — F17210 Nicotine dependence, cigarettes, uncomplicated: Secondary | ICD-10-CM

## 2016-07-02 DIAGNOSIS — R188 Other ascites: Secondary | ICD-10-CM | POA: Diagnosis not present

## 2016-07-02 DIAGNOSIS — Z79899 Other long term (current) drug therapy: Secondary | ICD-10-CM

## 2016-07-02 DIAGNOSIS — I864 Gastric varices: Secondary | ICD-10-CM | POA: Insufficient documentation

## 2016-07-02 DIAGNOSIS — I8500 Esophageal varices without bleeding: Secondary | ICD-10-CM | POA: Diagnosis not present

## 2016-07-02 LAB — CBC WITH DIFFERENTIAL/PLATELET
BASOS ABS: 0.1 10*3/uL (ref 0–0.1)
BASOS PCT: 1 %
EOS ABS: 0.2 10*3/uL (ref 0–0.7)
Eosinophils Relative: 2 %
HCT: 24.9 % — ABNORMAL LOW (ref 40.0–52.0)
HEMOGLOBIN: 8.1 g/dL — AB (ref 13.0–18.0)
Lymphocytes Relative: 4 %
Lymphs Abs: 0.4 10*3/uL — ABNORMAL LOW (ref 1.0–3.6)
MCH: 28 pg (ref 26.0–34.0)
MCHC: 32.6 g/dL (ref 32.0–36.0)
MCV: 85.8 fL (ref 80.0–100.0)
MONOS PCT: 6 %
Monocytes Absolute: 0.6 10*3/uL (ref 0.2–1.0)
NEUTROS PCT: 87 %
Neutro Abs: 8.3 10*3/uL — ABNORMAL HIGH (ref 1.4–6.5)
Platelets: 296 10*3/uL (ref 150–440)
RBC: 2.9 MIL/uL — ABNORMAL LOW (ref 4.40–5.90)
RDW: 19.1 % — AB (ref 11.5–14.5)
WBC: 9.6 10*3/uL (ref 3.8–10.6)

## 2016-07-02 LAB — COMPREHENSIVE METABOLIC PANEL
ALT: 22 U/L (ref 17–63)
ANION GAP: 3 — AB (ref 5–15)
AST: 54 U/L — ABNORMAL HIGH (ref 15–41)
Albumin: 1.8 g/dL — ABNORMAL LOW (ref 3.5–5.0)
Alkaline Phosphatase: 141 U/L — ABNORMAL HIGH (ref 38–126)
BUN: 7 mg/dL (ref 6–20)
CHLORIDE: 98 mmol/L — AB (ref 101–111)
CO2: 25 mmol/L (ref 22–32)
CREATININE: 0.63 mg/dL (ref 0.61–1.24)
Calcium: 7.7 mg/dL — ABNORMAL LOW (ref 8.9–10.3)
Glucose, Bld: 174 mg/dL — ABNORMAL HIGH (ref 65–99)
Potassium: 3.8 mmol/L (ref 3.5–5.1)
Sodium: 126 mmol/L — ABNORMAL LOW (ref 135–145)
Total Bilirubin: 4.2 mg/dL — ABNORMAL HIGH (ref 0.3–1.2)
Total Protein: 7.2 g/dL (ref 6.5–8.1)

## 2016-07-02 MED ORDER — OXYCODONE HCL 5 MG PO TABA: 5.0000 mg | ORAL_TABLET | Freq: Two times a day (BID) | ORAL | 0 refills | Status: DC | PRN

## 2016-07-02 MED ORDER — FUROSEMIDE 20 MG PO TABS: 20.0000 mg | ORAL_TABLET | Freq: Every day | ORAL | 0 refills | Status: DC

## 2016-07-02 NOTE — Progress Notes (Signed)
Patient here for initial consult. He is experiencing severe pain in his abdomen and right side for the past several months. He was given Tramadol in the hospital but it is ineffective. His temp was elevated today and he states "I feel terrible. Appetite poor due to stomach bloating. Bowels WNL.

## 2016-07-03 LAB — AFP TUMOR MARKER

## 2016-07-03 LAB — CEA: CEA: 4.6 ng/mL (ref 0.0–4.7)

## 2016-07-03 LAB — CANCER ANTIGEN 19-9: CA 19 9: 77 U/mL — AB (ref 0–35)

## 2016-07-04 ENCOUNTER — Telehealth: Payer: Self-pay

## 2016-07-04 DIAGNOSIS — R160 Hepatomegaly, not elsewhere classified: Secondary | ICD-10-CM

## 2016-07-04 NOTE — Telephone Encounter (Signed)
-----   Message from Wallene Dales sent at 07/04/2016  8:31 AM EDT ----- Regarding: Change Order Dr. Kathlene Cote did not approve the pt for a CT Biopsy. Per Nancee Liter he did approve fo the pt to have a US Biopsy. Once order is changed she will schedule pt for procedure.

## 2016-07-08 ENCOUNTER — Other Ambulatory Visit: Payer: Self-pay | Admitting: Radiology

## 2016-07-09 ENCOUNTER — Ambulatory Visit
Admission: RE | Admit: 2016-07-09 | Discharge: 2016-07-09 | Disposition: A | Discharge status: Home / Self Care | Payer: 59 | Source: Ambulatory Visit | Attending: Oncology | Admitting: Oncology

## 2016-07-09 ENCOUNTER — Other Ambulatory Visit: Payer: Self-pay | Admitting: Oncology

## 2016-07-09 ENCOUNTER — Telehealth: Payer: Self-pay

## 2016-07-09 DIAGNOSIS — Z833 Family history of diabetes mellitus: Secondary | ICD-10-CM | POA: Insufficient documentation

## 2016-07-09 DIAGNOSIS — J449 Chronic obstructive pulmonary disease, unspecified: Secondary | ICD-10-CM | POA: Diagnosis not present

## 2016-07-09 DIAGNOSIS — F17210 Nicotine dependence, cigarettes, uncomplicated: Secondary | ICD-10-CM | POA: Insufficient documentation

## 2016-07-09 DIAGNOSIS — Z803 Family history of malignant neoplasm of breast: Secondary | ICD-10-CM | POA: Diagnosis not present

## 2016-07-09 DIAGNOSIS — K7460 Unspecified cirrhosis of liver: Secondary | ICD-10-CM | POA: Diagnosis not present

## 2016-07-09 DIAGNOSIS — R160 Hepatomegaly, not elsewhere classified: Secondary | ICD-10-CM

## 2016-07-09 DIAGNOSIS — R188 Other ascites: Secondary | ICD-10-CM | POA: Insufficient documentation

## 2016-07-09 DIAGNOSIS — F1410 Cocaine abuse, uncomplicated: Secondary | ICD-10-CM | POA: Insufficient documentation

## 2016-07-09 DIAGNOSIS — B1920 Unspecified viral hepatitis C without hepatic coma: Secondary | ICD-10-CM | POA: Diagnosis not present

## 2016-07-09 DIAGNOSIS — Z825 Family history of asthma and other chronic lower respiratory diseases: Secondary | ICD-10-CM | POA: Insufficient documentation

## 2016-07-09 DIAGNOSIS — C220 Liver cell carcinoma: Secondary | ICD-10-CM | POA: Insufficient documentation

## 2016-07-09 HISTORY — DX: Unspecified osteoarthritis, unspecified site: M19.90

## 2016-07-09 HISTORY — DX: Pneumonia, unspecified organism: J18.9

## 2016-07-09 LAB — CBC
HEMATOCRIT: 26.2 % — AB (ref 40.0–52.0)
HEMOGLOBIN: 8.3 g/dL — AB (ref 13.0–18.0)
MCH: 26.8 pg (ref 26.0–34.0)
MCHC: 31.7 g/dL — ABNORMAL LOW (ref 32.0–36.0)
MCV: 84.8 fL (ref 80.0–100.0)
PLATELETS: 254 10*3/uL (ref 150–440)
RBC: 3.09 MIL/uL — ABNORMAL LOW (ref 4.40–5.90)
RDW: 18.7 % — AB (ref 11.5–14.5)
WBC: 11.7 10*3/uL — AB (ref 3.8–10.6)

## 2016-07-09 LAB — PROTIME-INR
INR: 1.92
PROTHROMBIN TIME: 22.2 s — AB (ref 11.4–15.2)

## 2016-07-09 LAB — APTT: APTT: 36 s (ref 24–36)

## 2016-07-09 MED ORDER — FENTANYL CITRATE (PF) 100 MCG/2ML IJ SOLN
INTRAMUSCULAR | Status: AC | PRN
  Administered 2016-07-09: 50 ug via INTRAVENOUS

## 2016-07-09 MED ORDER — MIDAZOLAM HCL 5 MG/5ML IJ SOLN
INTRAMUSCULAR | Status: AC | PRN
  Administered 2016-07-09: 1 mg via INTRAVENOUS

## 2016-07-09 MED ORDER — SODIUM CHLORIDE 0.9 % IV SOLN
INTRAVENOUS | Status: DC
  Administered 2016-07-09: 12:00:00 via INTRAVENOUS

## 2016-07-09 MED ORDER — MIDAZOLAM HCL 5 MG/5ML IJ SOLN
INTRAMUSCULAR | Status: AC
  Filled 2016-07-09: qty 5

## 2016-07-09 MED ORDER — TRAMADOL HCL 50 MG PO TABS: 50.0000 mg | ORAL_TABLET | Freq: Four times a day (QID) | ORAL | 0 refills | Status: DC | PRN

## 2016-07-09 MED ORDER — FENTANYL CITRATE (PF) 100 MCG/2ML IJ SOLN
INTRAMUSCULAR | Status: AC
  Filled 2016-07-09: qty 4

## 2016-07-09 NOTE — Telephone Encounter (Signed)
Sent in tramadol to pharmacy

## 2016-07-09 NOTE — Telephone Encounter (Signed)
Patient sister called and let us know he had his liver biopsy. She was asking if he could have a refill on his tramadol? He was having some pain and to help him without causing him problems can he have some tramadol?

## 2016-07-09 NOTE — H&P (Signed)
Chief Complaint: Patient was seen in consultation today for liver biopsy at the request of Finnegan,Timothy J  Referring Physician(s): Finnegan,Timothy J  Patient Status: [facility] - Out-pt  History of Present Illness: Donald Bowers is a 61 y.o. male with a history of hepatitis C infection, alcohol abuse and cirrhosis presenting with large hepatic masses in right and left lobes suspicious for [facility] with associated ascites and PV thrombus suspicious for tumor thrombus by MRI.  AFP 182,000. [place] on 4/22 showed small volume ascites.  Patient says he has had significant worsening of abdominal distention since then with abdominal pain, weakness, fatigue, increased dyspnea and increased LE edema.    Past Medical History:  Diagnosis Date  . Cocaine abuse   . COPD (chronic obstructive pulmonary disease) ([place])   . ETOH abuse   . GI bleed   . Hepatitis C     Past Surgical History:  Procedure Laterality Date  . CARDIAC CATHETERIZATION    . ESOPHAGOGASTRODUODENOSCOPY (EGD) WITH PROPOFOL N/A 06/21/2016   Procedure: ESOPHAGOGASTRODUODENOSCOPY (EGD) WITH PROPOFOL;  Surgeon: Wilford Corner, MD;  Location: [facility] ENDOSCOPY;  Service: Endoscopy;  Laterality: N/A;    Allergies: Patient has no known allergies.  Medications: Prior to Admission medications   Medication Sig Start Date End Date Taking? Authorizing Provider  folic acid (FOLVITE) 1 MG tablet Take 1 tablet (1 mg total) by mouth daily. 06/24/16   Fritzi Mandes, MD  furosemide (LASIX) 20 MG tablet Take 1 tablet (20 mg total) by mouth daily. 07/02/16   Lloyd Huger, MD  Multiple Vitamin (MULTIVITAMIN WITH MINERALS) TABS tablet Take 1 tablet by mouth daily. 06/24/16   Fritzi Mandes, MD  OxyCODONE HCl, Abuse Deter, (OXAYDO) 5 MG TABA Take 5 mg by mouth 2 (two) times daily as needed. 07/02/16   Lloyd Huger, MD  pantoprazole (PROTONIX) 40 MG tablet Take 1 tablet (40 mg total) by mouth daily. 06/24/16   Fritzi Mandes, MD  sucralfate  (CARAFATE) 1 GM/10ML suspension Take 10 mLs (1 g total) by mouth 4 (four) times daily -  with meals and at bedtime. 06/24/16   Fritzi Mandes, MD  traMADol (ULTRAM) 50 MG tablet Take 1 tablet (50 mg total) by mouth every 6 (six) hours as needed for moderate pain. 06/24/16   Fritzi Mandes, MD     Family History  Problem Relation Age of Onset  . COPD Mother   . Diabetes Sister   . Breast cancer Sister   . Cancer Father     back cancer  . Breast cancer Maternal Aunt   . Breast cancer Paternal Uncle     Social History   Social History  . Marital status: Single    Spouse name: N/A  . Number of children: N/A  . Years of education: N/A   Social History Main Topics  . Smoking status: Current Every Day Smoker    Packs/day: 0.50    Types: Cigarettes  . Smokeless tobacco: Never Used     Comment: pt said he has all the information he needs   . Alcohol use 7.2 oz/week    12 Cans of beer per week     Comment: weekly/daily, pt report drinking 2 can in 2weeks  . Drug use: Yes    Types: "Crack" cocaine     Comment: Last dose Wednesday 06/18/2016  . Sexual activity: Not on file   Other Topics Concern  . Not on file   Social History Narrative  . No narrative on  file    ECOG Status: 2 - Symptomatic, <50% confined to bed  Review of Systems: A 12 point ROS discussed and pertinent positives are indicated in the HPI above.  All other systems are negative.  Review of Systems  Constitutional: Positive for activity change, appetite change and fatigue.  HENT: Negative.   Respiratory: Positive for shortness of breath. Negative for chest tightness, wheezing and stridor.   Cardiovascular: Positive for leg swelling. Negative for chest pain and palpitations.  Gastrointestinal: Negative.   Genitourinary: Negative.   Musculoskeletal: Negative.   Neurological: Negative.     Vital Signs: BP 111/71 (BP Location: Left Arm)   Pulse 99   SpO2 100%   Physical Exam  Constitutional: He is oriented to  person, place, and time. No distress.  Alert, but mildly somnolent.  Fatigued.  HENT:  Head: Normocephalic and atraumatic.  Neck: Neck supple. No JVD present.  Cardiovascular: Normal rate, regular rhythm and normal heart sounds.  Exam reveals no gallop and no friction rub.   No murmur heard. Pulmonary/Chest: Effort normal and breath sounds normal. No respiratory distress. He has no wheezes. He has no rales.  Abdominal: Soft. Bowel sounds are normal. He exhibits distension. There is no tenderness. There is no rebound and no guarding.  Musculoskeletal: He exhibits edema.  Neurological: He is alert and oriented to person, place, and time.  Skin: He is not diaphoretic.  Vitals reviewed.   Mallampati Score:  MD Evaluation Airway: WNL Heart: WNL Abdomen: WNL Chest/ Lungs: WNL ASA  Classification: 3 Mallampati/Airway Score: One  Imaging: Dg Chest 1 View  Result Date: 06/22/2016 CLINICAL DATA:  Patient with history of ascites. Worsening shortness of breath. EXAM: CHEST 1 VIEW COMPARISON:  Chest radiograph 05/20/2016. FINDINGS: Low lung volumes. Stable prominent cardiac and mediastinal contours. Minimal heterogeneous opacities lung bases bilaterally. No pleural effusion or pneumothorax. Old right clavicle fracture. IMPRESSION: Low lung volumes with basilar heterogeneous opacities favored to represent atelectasis. Electronically Signed   By: Lovey Newcomer M.D.   On: 06/22/2016 09:30   Mr Liver W Wo Contrast  Result Date: 06/23/2016 CLINICAL DATA:  Hepatitis C, nausea/ vomiting, abnormal liver on prior CT EXAM: MRI ABDOMEN WITHOUT AND WITH CONTRAST TECHNIQUE: Multiplanar multisequence MR imaging of the abdomen was performed both before and after the administration of intravenous contrast. CONTRAST:  8 mL Eovist IV COMPARISON:  CT abdomen/pelvis dated 05/19/2016 FINDINGS: Motion degraded images. Lower chest: Small right pleural effusion. Associated right lower lobe atelectasis. Hepatobiliary:  Cirrhosis. 4.7 cm heterogeneously enhancing lesion in segment 4A (series 6/image 14), highly suspicious for HCC. Additional 13.2 cm heterogeneously enhancing lesion centered in segment 7 (series 6/ image 32), also highly suspicious for HCC with associated right portal vein thrombosis (series 10/ image 37). Gallbladder is notable for layering sludge (series 15/image 18). No associated inflammatory changes. Pancreas:  Grossly unremarkable. Spleen: Enlarged, measuring 16.0 cm in maximal craniocaudal dimension. Adrenals/Urinary Tract:  Adrenal glands are within normal limits. Kidneys are grossly unremarkable.  No hydronephrosis. Stomach/Bowel: Stomach and visualized bowel are grossly unremarkable. Vascular/Lymphatic: Thrombus extends into the main portal vein (series 17/image 21) and to the portosplenic confluence (series 17/ image 39) and proximal SMV (series 17/image 48). Gastroesophageal varices.  Recanalized periumbilical vein. Small upper abdominal lymph nodes, likely reactive. Other:  Small volume abdominal ascites. Musculoskeletal: No focal osseous lesions. IMPRESSION: Motion degraded images, limiting evaluation. Two dominant lesions in segment 4A and segment 7, as described above, suspicious for multifocal HCC. Associated portal vein thrombosis, suspicious for  tumor thrombus. Cirrhosis with stigmata of portal hypertension, as above. Electronically Signed   By: Julian Hy M.D.   On: 06/23/2016 13:29   US Abdomen Limited  Result Date: 06/22/2016 CLINICAL DATA:  Evaluate for ascites EXAM: LIMITED ABDOMEN ULTRASOUND FOR ASCITES TECHNIQUE: Limited ultrasound survey for ascites was performed in all four abdominal quadrants. COMPARISON:  Ultrasound 06/20/2016 FINDINGS: Small volume ascites within the right upper, right lower and left lower quadrants. IMPRESSION: Small volume ascites within the right upper, right lower and left lower quadrants. Electronically Signed   By: Lovey Newcomer M.D.   On: 06/22/2016  09:12   US Abdomen Limited Ruq  Result Date: 06/20/2016 CLINICAL DATA:  Hematemesis, abdominal pain. EXAM: US ABDOMEN LIMITED - RIGHT UPPER QUADRANT COMPARISON:  CT scan of May 19, 2016. Ultrasound of May 19, 2016. FINDINGS: Gallbladder: No gallstones are noted, but mild gallbladder wall thickening at 4 mm is noted. No pericholecystic fluid or sonographic Murphy's sign is noted. Common bile duct: Diameter: 4.8 mm which is within normal limits. Liver: Nodular hepatic contours are noted consistent with hepatic cirrhosis. At least 2 hypoechoic masses are noted, with the largest measuring 3.5 cm posteriorly in right hepatic lobe. These are concerning for metastatic disease. The distal portal vein appears to be thrombosed with hepatofugal flow seen proximally. IMPRESSION: Mild gallbladder wall thickening is noted most likely due to adjacent hepatocellular disease. Multiple hepatic masses are noted concerning for metastatic disease. Findings consistent with hepatic cirrhosis. Thrombosis of portal vein is noted. These results will be called to the ordering clinician or representative by the Radiologist Assistant, and communication documented in the PACS or zVision Dashboard. Electronically Signed   By: Marijo Conception, M.D.   On: 06/20/2016 10:50    Labs:  CBC:  Recent Labs  05/21/16 0525 06/19/16 1716  06/20/16 0858  06/21/16 0836 06/21/16 1646 06/22/16 0042 07/02/16 1150  WBC 7.6 13.9*  --  7.8  --   --   --   --  9.6  HGB 15.1 11.2*  < > 9.0*  < > 8.0* 9.1* 8.3* 8.1*  HCT 44.4 34.9*  --  28.1*  --   --   --   --  24.9*  PLT 63* 310  --  169  --   --   --   --  296  < > = values in this interval not displayed.  COAGS:  Recent Labs  05/19/16 1858 06/23/16 1139  INR 1.48 1.61    BMP:  Recent Labs  05/22/16 0631 06/19/16 1716 06/20/16 0858 07/02/16 1150  NA 130* 134* 138 126*  K 3.5 3.6 4.1 3.8  CL 102 98* 109 98*  CO2 22 30 26 25   GLUCOSE 123* 246* 98 174*  BUN 22* 48*  36* 7  CALCIUM 7.7* 8.0* 7.3* 7.7*  CREATININE 0.74 1.04 0.82 0.63  GFRNONAA >60 >60 >60 >60  GFRAA >60 >60 >60 >60    LIVER FUNCTION TESTS:  Recent Labs  10/23/15 1601 05/19/16 1401 06/19/16 1716 07/02/16 1150  BILITOT 2.0* 4.0* 3.8* 4.2*  AST 50* 51* 78* 54*  ALT 24 24 29 22   ALKPHOS 83 90 88 141*  PROT 8.0 7.7 6.8 7.2  ALBUMIN 2.7* 2.7* 2.1* 1.8*    TUMOR MARKERS:  Recent Labs  06/20/16 0858 06/20/16 1334 06/20/16 1701 06/22/16 1517 06/23/16 1604 07/02/16 1150  AFPTM  --   --  Diluted result Diluted result See below. Diluted result  CEA  --  2.7  --   --   --  4.6  CA199 45*  --   --   --   --  77*    Assessment and Plan:  [place] now shows increase in ascites of moderate volume which increases bleeding risk with liver biopsy.  Will perform paracentesis now and send fluid for cytology.  Will proceed with US biopsy of large right lobe liver lesion that occupies almost entire right lobe by [place] now with heterogenous tumor present.  Risks and benefits discussed with the patient including, but not limited to bleeding, infection, damage to adjacent structures or low yield requiring additional tests. All of the patient's questions were answered, patient is agreeable to proceed. Consent signed and in chart.  Thank you for this interesting consult.  I greatly enjoyed meeting Donald Bowers and look forward to participating in their care.  A copy of this report was sent to the requesting provider on this date.  Electronically SignedAletta Edouard T 07/09/2016, 11:07 AM     I spent a total of 30 Minutes in face to face in clinical consultation, greater than 50% of which was counseling/coordinating care for paracentesis and liver biopsy.

## 2016-07-09 NOTE — Procedures (Signed)
Interventional Radiology Procedure Note  Procedure:  US guided paracentesis and liver biopsy  Complications: None  Estimated Blood Loss: < 10 mL  Findings:  Paracentesis yielding 2.2 L of ascites.  Sample sent for cytology. US guided core biopsy of large, infiltrative mass of right lobe of liver.  18 G core biopsy x 2 via 17 G needle. Gelfoam slurry injected in tract after biopsy.  Venetia Night. Kathlene Cote, M.D Pager:  [phone]

## 2016-07-09 NOTE — Telephone Encounter (Signed)
Yes we can.  Thanks.

## 2016-07-11 LAB — SURGICAL PATHOLOGY

## 2016-07-11 LAB — CYTOLOGY - NON PAP

## 2016-07-14 ENCOUNTER — Inpatient Hospital Stay: Payer: 59

## 2016-07-14 ENCOUNTER — Emergency Department: Payer: 59

## 2016-07-14 ENCOUNTER — Inpatient Hospital Stay
Admission: EM | Admit: 2016-07-14 | Discharge: 2016-07-21 | DRG: 871 | Disposition: A | Discharge status: Home Health | Payer: 59 | Attending: Internal Medicine | Admitting: Internal Medicine

## 2016-07-14 ENCOUNTER — Encounter: Payer: Self-pay | Admitting: Emergency Medicine

## 2016-07-14 DIAGNOSIS — B1920 Unspecified viral hepatitis C without hepatic coma: Secondary | ICD-10-CM | POA: Diagnosis present

## 2016-07-14 DIAGNOSIS — C220 Liver cell carcinoma: Secondary | ICD-10-CM | POA: Diagnosis not present

## 2016-07-14 DIAGNOSIS — F1410 Cocaine abuse, uncomplicated: Secondary | ICD-10-CM | POA: Diagnosis present

## 2016-07-14 DIAGNOSIS — K7290 Hepatic failure, unspecified without coma: Secondary | ICD-10-CM | POA: Diagnosis present

## 2016-07-14 DIAGNOSIS — D649 Anemia, unspecified: Secondary | ICD-10-CM | POA: Diagnosis present

## 2016-07-14 DIAGNOSIS — K7031 Alcoholic cirrhosis of liver with ascites: Secondary | ICD-10-CM | POA: Diagnosis not present

## 2016-07-14 DIAGNOSIS — A419 Sepsis, unspecified organism: Secondary | ICD-10-CM | POA: Diagnosis present

## 2016-07-14 DIAGNOSIS — K652 Spontaneous bacterial peritonitis: Secondary | ICD-10-CM | POA: Diagnosis present

## 2016-07-14 DIAGNOSIS — E8809 Other disorders of plasma-protein metabolism, not elsewhere classified: Secondary | ICD-10-CM | POA: Diagnosis present

## 2016-07-14 DIAGNOSIS — R630 Anorexia: Secondary | ICD-10-CM | POA: Diagnosis not present

## 2016-07-14 DIAGNOSIS — D72829 Elevated white blood cell count, unspecified: Secondary | ICD-10-CM | POA: Diagnosis not present

## 2016-07-14 DIAGNOSIS — R0602 Shortness of breath: Secondary | ICD-10-CM

## 2016-07-14 DIAGNOSIS — Z825 Family history of asthma and other chronic lower respiratory diseases: Secondary | ICD-10-CM

## 2016-07-14 DIAGNOSIS — R188 Other ascites: Secondary | ICD-10-CM | POA: Diagnosis not present

## 2016-07-14 DIAGNOSIS — I8500 Esophageal varices without bleeding: Secondary | ICD-10-CM | POA: Diagnosis present

## 2016-07-14 DIAGNOSIS — C221 Intrahepatic bile duct carcinoma: Secondary | ICD-10-CM | POA: Diagnosis not present

## 2016-07-14 DIAGNOSIS — E8770 Fluid overload, unspecified: Secondary | ICD-10-CM | POA: Diagnosis present

## 2016-07-14 DIAGNOSIS — Z515 Encounter for palliative care: Secondary | ICD-10-CM

## 2016-07-14 DIAGNOSIS — M129 Arthropathy, unspecified: Secondary | ICD-10-CM | POA: Diagnosis not present

## 2016-07-14 DIAGNOSIS — Z833 Family history of diabetes mellitus: Secondary | ICD-10-CM | POA: Diagnosis not present

## 2016-07-14 DIAGNOSIS — R531 Weakness: Secondary | ICD-10-CM | POA: Diagnosis not present

## 2016-07-14 DIAGNOSIS — R5383 Other fatigue: Secondary | ICD-10-CM | POA: Diagnosis not present

## 2016-07-14 DIAGNOSIS — Z79899 Other long term (current) drug therapy: Secondary | ICD-10-CM

## 2016-07-14 DIAGNOSIS — R441 Visual hallucinations: Secondary | ICD-10-CM | POA: Diagnosis present

## 2016-07-14 DIAGNOSIS — F1010 Alcohol abuse, uncomplicated: Secondary | ICD-10-CM | POA: Diagnosis present

## 2016-07-14 DIAGNOSIS — E871 Hypo-osmolality and hyponatremia: Secondary | ICD-10-CM | POA: Diagnosis present

## 2016-07-14 DIAGNOSIS — Z7189 Other specified counseling: Secondary | ICD-10-CM | POA: Diagnosis not present

## 2016-07-14 DIAGNOSIS — J449 Chronic obstructive pulmonary disease, unspecified: Secondary | ICD-10-CM | POA: Diagnosis present

## 2016-07-14 DIAGNOSIS — R140 Abdominal distension (gaseous): Secondary | ICD-10-CM

## 2016-07-14 DIAGNOSIS — R17 Unspecified jaundice: Secondary | ICD-10-CM | POA: Diagnosis not present

## 2016-07-14 DIAGNOSIS — Z87891 Personal history of nicotine dependence: Secondary | ICD-10-CM

## 2016-07-14 LAB — BODY FLUID CELL COUNT WITH DIFFERENTIAL
EOS FL: 0 %
LYMPHS FL: 0 %
MONOCYTE-MACROPHAGE-SEROUS FLUID: 0 %
NEUTROPHIL FLUID: 100 %
Other Cells, Fluid: 0 %
WBC FLUID: 10199 uL

## 2016-07-14 LAB — LIPASE, BLOOD: Lipase: 33 U/L (ref 11–51)

## 2016-07-14 LAB — COMPREHENSIVE METABOLIC PANEL
ALT: 25 U/L (ref 17–63)
AST: 119 U/L — ABNORMAL HIGH (ref 15–41)
Albumin: 1.8 g/dL — ABNORMAL LOW (ref 3.5–5.0)
Alkaline Phosphatase: 121 U/L (ref 38–126)
Anion gap: 5 (ref 5–15)
BILIRUBIN TOTAL: 5.8 mg/dL — AB (ref 0.3–1.2)
BUN: 16 mg/dL (ref 6–20)
CALCIUM: 7.5 mg/dL — AB (ref 8.9–10.3)
CHLORIDE: 92 mmol/L — AB (ref 101–111)
CO2: 24 mmol/L (ref 22–32)
CREATININE: 0.96 mg/dL (ref 0.61–1.24)
Glucose, Bld: 150 mg/dL — ABNORMAL HIGH (ref 65–99)
Potassium: 4.3 mmol/L (ref 3.5–5.1)
Sodium: 121 mmol/L — ABNORMAL LOW (ref 135–145)
TOTAL PROTEIN: 7.8 g/dL (ref 6.5–8.1)

## 2016-07-14 LAB — CBC
HEMATOCRIT: 27.8 % — AB (ref 40.0–52.0)
Hemoglobin: 8.9 g/dL — ABNORMAL LOW (ref 13.0–18.0)
MCH: 26.5 pg (ref 26.0–34.0)
MCHC: 32.1 g/dL (ref 32.0–36.0)
MCV: 82.4 fL (ref 80.0–100.0)
PLATELETS: 284 10*3/uL (ref 150–440)
RBC: 3.38 MIL/uL — AB (ref 4.40–5.90)
RDW: 19.8 % — AB (ref 11.5–14.5)
WBC: 17.4 10*3/uL — ABNORMAL HIGH (ref 3.8–10.6)

## 2016-07-14 LAB — AMMONIA: Ammonia: 18 umol/L (ref 9–35)

## 2016-07-14 LAB — PROTIME-INR
INR: 1.92
Prothrombin Time: 22.2 seconds — ABNORMAL HIGH (ref 11.4–15.2)

## 2016-07-14 LAB — APTT: aPTT: 38 seconds — ABNORMAL HIGH (ref 24–36)

## 2016-07-14 LAB — MAGNESIUM: MAGNESIUM: 1.9 mg/dL (ref 1.7–2.4)

## 2016-07-14 LAB — BRAIN NATRIURETIC PEPTIDE: B Natriuretic Peptide: 77 pg/mL (ref 0.0–100.0)

## 2016-07-14 LAB — TROPONIN I

## 2016-07-14 LAB — LACTIC ACID, PLASMA: Lactic Acid, Venous: 2.4 mmol/L (ref 0.5–1.9)

## 2016-07-14 MED ORDER — SODIUM CHLORIDE 0.9% FLUSH
3.0000 mL | Freq: Two times a day (BID) | INTRAVENOUS | Status: DC
  Administered 2016-07-14 – 2016-07-20 (×13): 3 mL via INTRAVENOUS

## 2016-07-14 MED ORDER — ACETAMINOPHEN 325 MG PO TABS
650.0000 mg | ORAL_TABLET | Freq: Four times a day (QID) | ORAL | Status: DC | PRN
  Administered 2016-07-14: 20:00:00 650 mg via ORAL
  Filled 2016-07-14: qty 2

## 2016-07-14 MED ORDER — POLYETHYLENE GLYCOL 3350 17 G PO PACK
17.0000 g | PACK | Freq: Every day | ORAL | Status: DC | PRN
  Administered 2016-07-18: 17 g via ORAL
  Filled 2016-07-14: qty 1

## 2016-07-14 MED ORDER — OXYCODONE HCL 5 MG PO TABS
5.0000 mg | ORAL_TABLET | ORAL | Status: DC | PRN
  Administered 2016-07-15 – 2016-07-16 (×3): 5 mg via ORAL
  Filled 2016-07-14 (×3): qty 1

## 2016-07-14 MED ORDER — ONDANSETRON HCL 4 MG/2ML IJ SOLN
4.0000 mg | Freq: Four times a day (QID) | INTRAMUSCULAR | Status: DC | PRN
  Administered 2016-07-15 – 2016-07-16 (×3): 4 mg via INTRAVENOUS
  Filled 2016-07-14 (×3): qty 2

## 2016-07-14 MED ORDER — ACETAMINOPHEN 650 MG RE SUPP: 650.0000 mg | Freq: Four times a day (QID) | RECTAL | Status: DC | PRN

## 2016-07-14 MED ORDER — DEXTROSE 5 % IV SOLN
2.0000 g | INTRAVENOUS | Status: DC
  Administered 2016-07-14 – 2016-07-17 (×4): 2 g via INTRAVENOUS
  Filled 2016-07-14 (×5): qty 2

## 2016-07-14 MED ORDER — ALBUTEROL SULFATE (2.5 MG/3ML) 0.083% IN NEBU: 2.5000 mg | INHALATION_SOLUTION | RESPIRATORY_TRACT | Status: DC | PRN

## 2016-07-14 MED ORDER — ZOLPIDEM TARTRATE 5 MG PO TABS: 5.0000 mg | ORAL_TABLET | Freq: Once | ORAL | Status: DC

## 2016-07-14 MED ORDER — ONDANSETRON HCL 4 MG PO TABS: 4.0000 mg | ORAL_TABLET | Freq: Four times a day (QID) | ORAL | Status: DC | PRN

## 2016-07-14 MED ORDER — PANTOPRAZOLE SODIUM 40 MG PO TBEC
40.0000 mg | DELAYED_RELEASE_TABLET | Freq: Every day | ORAL | Status: DC
  Administered 2016-07-14 – 2016-07-21 (×8): 40 mg via ORAL
  Filled 2016-07-14 (×9): qty 1

## 2016-07-14 MED ORDER — MORPHINE SULFATE (PF) 2 MG/ML IV SOLN
2.0000 mg | INTRAVENOUS | Status: DC | PRN
  Administered 2016-07-14 – 2016-07-16 (×5): 2 mg via INTRAVENOUS
  Filled 2016-07-14 (×5): qty 1

## 2016-07-14 MED ORDER — TRAZODONE HCL 50 MG PO TABS
50.0000 mg | ORAL_TABLET | Freq: Every evening | ORAL | Status: DC | PRN
  Administered 2016-07-14: 22:00:00 50 mg via ORAL
  Filled 2016-07-14: qty 1

## 2016-07-14 MED ORDER — FUROSEMIDE 40 MG PO TABS
40.0000 mg | ORAL_TABLET | Freq: Every day | ORAL | Status: DC
  Administered 2016-07-14 – 2016-07-16 (×3): 40 mg via ORAL
  Filled 2016-07-14 (×3): qty 1

## 2016-07-14 MED ORDER — SODIUM CHLORIDE 0.9 % IV BOLUS (SEPSIS)
1000.0000 mL | Freq: Once | INTRAVENOUS | Status: AC
  Administered 2016-07-14: 1000 mL via INTRAVENOUS

## 2016-07-14 NOTE — ED Notes (Signed)
Dr. Darvin Neighbours in patient's room.

## 2016-07-14 NOTE — ED Provider Notes (Signed)
[facility] Emergency Department Provider Note   ____________________________________________    I have reviewed the triage vital signs and the nursing notes.   HISTORY  Chief Complaint Shortness of Breath and Ascites     HPI Donald Bowers is a 61 y.o. male who presents to the emergency department with complaints of shortness of breath and abdominal distention with abdominal pain. Patient recently diagnosed with likely liver cancer had a biopsy approximately one week ago. He notes he had fluid drawn off of his abdomen week as well today his abdomen is far better than it has been and it is making him have difficulty breathing. He denies fevers to me. No vomiting.   Past Medical History:  Diagnosis Date  . Arthritis   . Cocaine abuse   . COPD (chronic obstructive pulmonary disease) ([place])   . ETOH abuse   . GI bleed   . Hepatitis C   . Pneumonia     Patient Active Problem List   Diagnosis Date Noted  . Liver mass   . Hematemesis 06/19/2016  . Melena 06/19/2016  . COPD (chronic obstructive pulmonary disease) ([place]) 06/19/2016  . Hepatitis C 06/19/2016  . Hyponatremia 05/19/2016    Past Surgical History:  Procedure Laterality Date  . CARDIAC CATHETERIZATION    . ESOPHAGOGASTRODUODENOSCOPY (EGD) WITH PROPOFOL N/A 06/21/2016   Procedure: ESOPHAGOGASTRODUODENOSCOPY (EGD) WITH PROPOFOL;  Surgeon: Wilford Corner, MD;  Location: [facility] ENDOSCOPY;  Service: Endoscopy;  Laterality: N/A;    Prior to Admission medications   Medication Sig Start Date End Date Taking? Authorizing Provider  folic acid (FOLVITE) 1 MG tablet Take 1 tablet (1 mg total) by mouth daily. 06/24/16  Yes Fritzi Mandes, MD  furosemide (LASIX) 20 MG tablet Take 1 tablet (20 mg total) by mouth daily. 07/02/16  Yes Lloyd Huger, MD  pantoprazole (PROTONIX) 40 MG tablet Take 1 tablet (40 mg total) by mouth daily. 06/24/16  Yes Fritzi Mandes, MD  Multiple Vitamin (MULTIVITAMIN  WITH MINERALS) TABS tablet Take 1 tablet by mouth daily. Patient not taking: Reported on 07/14/2016 06/24/16   Fritzi Mandes, MD  OxyCODONE HCl, Abuse Deter, (OXAYDO) 5 MG TABA Take 5 mg by mouth 2 (two) times daily as needed. Patient not taking: Reported on 07/14/2016 07/02/16   Lloyd Huger, MD  sucralfate (CARAFATE) 1 GM/10ML suspension Take 10 mLs (1 g total) by mouth 4 (four) times daily -  with meals and at bedtime. Patient not taking: Reported on 07/14/2016 06/24/16   Fritzi Mandes, MD  traMADol (ULTRAM) 50 MG tablet Take 1 tablet (50 mg total) by mouth every 6 (six) hours as needed for moderate pain. 07/09/16   Lloyd Huger, MD     Allergies Patient has no known allergies.  Family History  Problem Relation Age of Onset  . COPD Mother   . Diabetes Sister   . Breast cancer Sister   . Cancer Father        back cancer  . Breast cancer Maternal Aunt   . Breast cancer Paternal Uncle     Social History Social History  Substance Use Topics  . Smoking status: Former Smoker    Packs/day: 0.50    Types: Cigarettes  . Smokeless tobacco: Never Used     Comment: pt said he has all the information he needs   . Alcohol use 7.2 oz/week    12 Cans of beer per week     Comment: weekly/daily, pt report drinking 2  can in 2weeks    Review of Systems  Constitutional: No fever/chills Eyes: No visual changes.  ENT: No sore throat. Cardiovascular: Denies chest pain. Respiratory: Shortness of breath as above Gastrointestinal: Abdominal distention as above, no nausea no vomiting Genitourinary: Negative for dysuria. Musculoskeletal: Negative for back pain. Skin: Negative for rash. Neurological: Negative for headaches or weakness   ____________________________________________   PHYSICAL EXAM:  VITAL SIGNS: ED Triage Vitals  Enc Vitals Group     BP 07/14/16 0914 91/76     Pulse Rate 07/14/16 0914 (!) 111     Resp 07/14/16 0914 (!) 30     Temp 07/14/16 0914 98.1 F (36.7 C)      Temp Source 07/14/16 0914 Oral     SpO2 07/14/16 0911 94 %     Weight --      Height 07/14/16 0916 6' (1.829 m)     Head Circumference --      Peak Flow --      Pain Score --      Pain Loc --      Pain Edu? --      Excl. in [place]? --     Constitutional: Alert and oriented. .  Eyes: Conjunctivae are normal.   Nose: No congestion/rhinnorhea. Mouth/Throat: Mucous membranes are moist.    Cardiovascular: Normal rate, regular rhythm. Grossly normal heart sounds.  Good peripheral circulation. Respiratory: Normal respiratory effort.  No retractions. Lungs CTAB. Gastrointestinal: Significant distention, kaput medusa, mild ttp diffusely.  No CVA tenderness. Genitourinary: deferred Musculoskeletal:  Warm and well perfused Neurologic:  Normal speech and language. No gross focal neurologic deficits are appreciated.  Skin:  Skin is warm, dry and intact. No rash noted. Psychiatric: Mood and affect are normal. Speech and behavior are normal.  ____________________________________________   LABS (all labs ordered are listed, but only abnormal results are displayed)  Labs Reviewed  CBC - Abnormal; Notable for the following:       Result Value   WBC 17.4 (*)    RBC 3.38 (*)    Hemoglobin 8.9 (*)    HCT 27.8 (*)    RDW 19.8 (*)    All other components within normal limits  COMPREHENSIVE METABOLIC PANEL - Abnormal; Notable for the following:    Sodium 121 (*)    Chloride 92 (*)    Glucose, Bld 150 (*)    Calcium 7.5 (*)    Albumin 1.8 (*)    AST 119 (*)    Total Bilirubin 5.8 (*)    All other components within normal limits  PROTIME-INR - Abnormal; Notable for the following:    Prothrombin Time 22.2 (*)    All other components within normal limits  APTT - Abnormal; Notable for the following:    aPTT 38 (*)    All other components within normal limits  TROPONIN I  LIPASE, BLOOD   ____________________________________________  EKG  ED ECG REPORT I, Lavonia Drafts, the attending  physician, personally viewed and interpreted this ECG.  Date: 07/14/2016 EKG Time: 9:20 AM Rate: 109 Rhythm: Sinus tachycardia QRS Axis: normal Intervals: normal ST/T Wave abnormalities: normal Conduction Disturbances: none   ____________________________________________  RADIOLOGY  cxr shows interstitial edema ____________________________________________   PROCEDURES  Procedure(s) performed: No    Critical Care performed: no ____________________________________________   INITIAL IMPRESSION / ASSESSMENT AND PLAN / ED COURSE  Pertinent labs & imaging results that were available during my care of the patient were reviewed by me and considered in my medical  decision making (see chart for details).  Patient presents with significant abdominal distention which appears to be related to ascites due to liver mass/cancer. He is short of breath from the amount of distention. He does have some mild tenderness to palpation diffusely of his abdomen and given recent biopsy SBP is a possibility, we will send him for paracentesis anticipated admission  Patient with hyponatremia and elevated wbc on labs. No fever. Will check ascites fluid from paracentesis for any evidence of sbp, but do not feel he has it clinically.  Notified Dr. Mike Gip.   Admitted to hospitalist service    ____________________________________________   FINAL CLINICAL IMPRESSION(S) / ED DIAGNOSES  Final diagnoses:  Abdominal distension  Acute hyponatremia  Other ascites  Shortness of breath      NEW MEDICATIONS STARTED DURING THIS VISIT:  New Prescriptions   No medications on file     Note:  This document was prepared using Dragon voice recognition software and may include unintentional dictation errors.    Lavonia Drafts, MD 07/14/16 1039

## 2016-07-14 NOTE — ED Triage Notes (Signed)
Pt arrived via EMS from home with reports of increased shortness of breath due to fluid buildup in abdomen. Pt recently diagnosed with liver CA this month and has not yet started tx. Pt skin is jaundic ed as well as sclera. Pt had liver bx 5/9 and had a total of 2L of fluid pulled from abdomen as well.

## 2016-07-14 NOTE — H&P (Signed)
[place] [facility] [place] NAME: Donald Bowers    MR#:  [id number]  DATE OF BIRTH:  1955/04/18  DATE OF ADMISSION:  07/14/2016  PRIMARY CARE PHYSICIAN: Dion Body, MD   REQUESTING/REFERRING PHYSICIAN: Dr. Corky Downs  CHIEF COMPLAINT:   Chief Complaint  Patient presents with  . Shortness of Breath  . Ascites    HISTORY OF PRESENT ILLNESS:  Donald Bowers  is a 61 y.o. male with a known history of recently diagnosed HCC, Hep C, Alcohol abuse, presents to the ED due to abdominal distention and SOB. He had liver biopsy and paracentesis on same day 07/09/2016. 2.2 liters fluid drained. Fluid not sent to lab. Here he has abd pain, tachycardia, leucocytosis. CXR shows mild interstitial edema.  PAST MEDICAL HISTORY:   Past Medical History:  Diagnosis Date  . Arthritis   . Cocaine abuse   . COPD (chronic obstructive pulmonary disease) ([place])   . ETOH abuse   . GI bleed   . Hepatitis C   . Pneumonia     PAST SURGICAL HISTORY:   Past Surgical History:  Procedure Laterality Date  . CARDIAC CATHETERIZATION    . ESOPHAGOGASTRODUODENOSCOPY (EGD) WITH PROPOFOL N/A 06/21/2016   Procedure: ESOPHAGOGASTRODUODENOSCOPY (EGD) WITH PROPOFOL;  Surgeon: Wilford Corner, MD;  Location: [facility] ENDOSCOPY;  Service: Endoscopy;  Laterality: N/A;    SOCIAL HISTORY:   Social History  Substance Use Topics  . Smoking status: Former Smoker    Packs/day: 0.50    Types: Cigarettes  . Smokeless tobacco: Never Used     Comment: pt said he has all the information he needs   . Alcohol use 7.2 oz/week    12 Cans of beer per week     Comment: weekly/daily, pt report drinking 2 can in 2weeks    FAMILY HISTORY:   Family History  Problem Relation Age of Onset  . COPD Mother   . Diabetes Sister   . Breast cancer Sister   . Cancer Father        back cancer  . Breast cancer Maternal Aunt   . Breast cancer Paternal Uncle     DRUG ALLERGIES:  No Known  Allergies  REVIEW OF SYSTEMS:   Review of Systems  Constitutional: Positive for chills and malaise/fatigue. Negative for fever and weight loss.  HENT: Negative for hearing loss and nosebleeds.   Eyes: Negative for blurred vision, double vision and pain.  Respiratory: Positive for cough and shortness of breath. Negative for hemoptysis, sputum production and wheezing.   Cardiovascular: Positive for orthopnea. Negative for chest pain, palpitations and leg swelling.  Gastrointestinal: Positive for abdominal pain. Negative for constipation, diarrhea, nausea and vomiting.  Genitourinary: Negative for dysuria and hematuria.  Musculoskeletal: Negative for back pain, falls and myalgias.  Skin: Negative for rash.  Neurological: Positive for weakness. Negative for dizziness, tremors, sensory change, speech change, focal weakness, seizures and headaches.  Endo/Heme/Allergies: Does not bruise/bleed easily.  Psychiatric/Behavioral: Negative for depression and memory loss. The patient is not nervous/anxious.     MEDICATIONS AT HOME:   Prior to Admission medications   Medication Sig Start Date End Date Taking? Authorizing Provider  amoxicillin-clavulanate (AUGMENTIN) 875-125 MG tablet Take 1 tablet by mouth 2 (two) times daily.   Yes [provider]  folic acid (FOLVITE) 1 MG tablet Take 1 tablet (1 mg total) by mouth daily. 06/24/16  Yes Fritzi Mandes, MD  furosemide (LASIX) 20 MG tablet Take 1 tablet (20 mg total)  by mouth daily. 07/02/16  Yes Lloyd Huger, MD  pantoprazole (PROTONIX) 40 MG tablet Take 1 tablet (40 mg total) by mouth daily. 06/24/16  Yes Fritzi Mandes, MD  Multiple Vitamin (MULTIVITAMIN WITH MINERALS) TABS tablet Take 1 tablet by mouth daily. Patient not taking: Reported on 07/14/2016 06/24/16   Fritzi Mandes, MD  OxyCODONE HCl, Abuse Deter, (OXAYDO) 5 MG TABA Take 5 mg by mouth 2 (two) times daily as needed. Patient not taking: Reported on 07/14/2016 07/02/16   Lloyd Huger,  MD  sucralfate (CARAFATE) 1 GM/10ML suspension Take 10 mLs (1 g total) by mouth 4 (four) times daily -  with meals and at bedtime. Patient not taking: Reported on 07/14/2016 06/24/16   Fritzi Mandes, MD  traMADol (ULTRAM) 50 MG tablet Take 1 tablet (50 mg total) by mouth every 6 (six) hours as needed for moderate pain. 07/09/16   Lloyd Huger, MD     VITAL SIGNS:  Blood pressure 105/73, pulse (!) 110, temperature 98.1 F (36.7 C), temperature source Oral, resp. rate (!) 24, height 6' (1.829 m), SpO2 100 %.  PHYSICAL EXAMINATION:  Physical Exam  GENERAL:  61 y.o.-year-old patient lying in the bed with no acute distress.  EYES: Pupils equal, round, reactive to light and accommodation. No scleral icterus. Extraocular muscles intact.  HEENT: Head atraumatic, normocephalic. Oropharynx and nasopharynx clear. No oropharyngeal erythema, moist oral mucosa  NECK:  Supple, no jugular venous distention. No thyroid enlargement, no tenderness.  LUNGS: bilateral crackles CARDIOVASCULAR: S1, S2 normal. No murmurs, rubs, or gallops.  ABDOMEN: Soft,. Bowel sounds present. No organomegaly or mass. Distended and diffuse tenderness EXTREMITIES: No pedal edema, cyanosis, or clubbing. + 2 pedal & radial pulses b/l.   NEUROLOGIC: Cranial nerves II through XII are intact. No focal Motor or sensory deficits appreciated b/l PSYCHIATRIC: The patient is alert and oriented x 3. Good affect.  SKIN: No obvious rash, lesion, or ulcer.   LABORATORY PANEL:   CBC  Recent Labs Lab 07/14/16 0924  WBC 17.4*  HGB 8.9*  HCT 27.8*  PLT 284   ------------------------------------------------------------------------------------------------------------------  Chemistries   Recent Labs Lab 07/14/16 0924  NA 121*  K 4.3  CL 92*  CO2 24  GLUCOSE 150*  BUN 16  CREATININE 0.96  CALCIUM 7.5*  MG 1.9  AST 119*  ALT 25  ALKPHOS 121  BILITOT 5.8*    ------------------------------------------------------------------------------------------------------------------  Cardiac Enzymes  Recent Labs Lab 07/14/16 0924  TROPONINI <0.03   ------------------------------------------------------------------------------------------------------------------  RADIOLOGY:  Dg Chest 1 View  Result Date: 07/14/2016 CLINICAL DATA:  Increasing shortness of breath, increasing ascites. Recently diagnosis with malignancy of the liver and has not begun treatment. EXAM: CHEST 1 VIEW COMPARISON:  Portable chest x-ray of June 22, 2016 FINDINGS: The lungs are less well inflated today. The interstitial markings remain increased. The cardiac silhouette is enlarged. The central pulmonary vascularity is prominent. There is calcification in the wall of the aortic arch. The bony thorax exhibits no acute abnormality. IMPRESSION: Mild pulmonary interstitial edema accentuated by mild hypoinflation. Mild cardiomegaly. No acute pneumonia. Thoracic aortic atherosclerosis. Electronically Signed   By: David  [place]ique M.D.   On: 07/14/2016 09:51     IMPRESSION AND PLAN:   * Severe ascites likely secondary to hepatocellular carcinoma and hypoalbuminemia Ultrasound paracentesis ordered. Labs ordered on the fluid. He does have diffuse abdominal tenderness with white blood cell count. We will order IV antibiotics with ceftriaxone until we have results back from the fluid analysis to check  on spontaneous bacterial peritonitis.  * Sepsis present on admission likely due to SBP. We will also check urine analysis as he had recent UTI. Patient is being started on IV ceftriaxone. Seems fluid overloaded with the ascites. Will not give IV fluids. Check lactic acid.  * Hypervolemic hyponatremia. We will order Lasix. With hepatocellular carcinoma and ascites he would likely have a chronic hyponatremia. Will need to see where patient sodium settles down by the time of discharge.  *  Hepatocellular carcinoma. We will consult oncology Dr. Grayland Ormond patient was supposed to see to discuss prognosis and treatment plan.  * DVT prophylaxis with SCDs. Patient had recent GI bleed due to esophageal varices. He also has elevated INR.  All the records are reviewed and case discussed with ED provider. Management plans discussed with the patient, family and they are in agreement.  CODE STATUS: FULL CODE  TOTAL TIME TAKING CARE OF THIS PATIENT: 40 minutes.   Hillary Bow R M.D on 07/14/2016 at 11:58 AM  Between 7am to 6pm - Pager - [phone]  After 6pm go to [email or web address] - password [email or web address] [place] [facility]  Office  [phone]  CC: Primary care physician; Dion Body, MD  Note: This dictation was prepared with Dragon dictation along with smaller phrase technology. Any transcriptional errors that result from this process are unintentional.

## 2016-07-14 NOTE — ED Notes (Addendum)
Pt sitting on the side of the bed. Updated patient on the plan of care.  Warm blanket given. Pt given phone to update son.

## 2016-07-14 NOTE — ED Notes (Signed)
Spoke with [place]. Pt will be going for paracentesis shortly. Pt will go from the ED to the procedure.

## 2016-07-14 NOTE — ED Notes (Signed)
UPdated pt and familyon plan of care. [place] in room to take patient to paracentesis. [place] will call RN to get patient to the floor.

## 2016-07-14 NOTE — Progress Notes (Signed)
Pharmacist - Prescriber Communication  Ceftriaxone dose has been modified to 2 gm IV Q24H for IAI.   Shree Espey A. [place], [place].D., BCPS Clinical Pharmacist 07/14/2016 12:04

## 2016-07-15 DIAGNOSIS — Z79899 Other long term (current) drug therapy: Secondary | ICD-10-CM

## 2016-07-15 DIAGNOSIS — F1410 Cocaine abuse, uncomplicated: Secondary | ICD-10-CM

## 2016-07-15 DIAGNOSIS — Z7952 Long term (current) use of systemic steroids: Secondary | ICD-10-CM

## 2016-07-15 DIAGNOSIS — Z87891 Personal history of nicotine dependence: Secondary | ICD-10-CM

## 2016-07-15 DIAGNOSIS — R5383 Other fatigue: Secondary | ICD-10-CM

## 2016-07-15 DIAGNOSIS — D649 Anemia, unspecified: Secondary | ICD-10-CM

## 2016-07-15 DIAGNOSIS — R630 Anorexia: Secondary | ICD-10-CM

## 2016-07-15 DIAGNOSIS — R188 Other ascites: Secondary | ICD-10-CM

## 2016-07-15 DIAGNOSIS — Z803 Family history of malignant neoplasm of breast: Secondary | ICD-10-CM

## 2016-07-15 DIAGNOSIS — R17 Unspecified jaundice: Secondary | ICD-10-CM

## 2016-07-15 DIAGNOSIS — Z8619 Personal history of other infectious and parasitic diseases: Secondary | ICD-10-CM

## 2016-07-15 DIAGNOSIS — R531 Weakness: Secondary | ICD-10-CM

## 2016-07-15 DIAGNOSIS — Z808 Family history of malignant neoplasm of other organs or systems: Secondary | ICD-10-CM

## 2016-07-15 DIAGNOSIS — D72829 Elevated white blood cell count, unspecified: Secondary | ICD-10-CM

## 2016-07-15 DIAGNOSIS — R0602 Shortness of breath: Secondary | ICD-10-CM

## 2016-07-15 DIAGNOSIS — K922 Gastrointestinal hemorrhage, unspecified: Secondary | ICD-10-CM

## 2016-07-15 DIAGNOSIS — K652 Spontaneous bacterial peritonitis: Secondary | ICD-10-CM

## 2016-07-15 DIAGNOSIS — F1010 Alcohol abuse, uncomplicated: Secondary | ICD-10-CM

## 2016-07-15 DIAGNOSIS — J449 Chronic obstructive pulmonary disease, unspecified: Secondary | ICD-10-CM

## 2016-07-15 DIAGNOSIS — E871 Hypo-osmolality and hyponatremia: Secondary | ICD-10-CM

## 2016-07-15 DIAGNOSIS — C221 Intrahepatic bile duct carcinoma: Secondary | ICD-10-CM

## 2016-07-15 DIAGNOSIS — Z8701 Personal history of pneumonia (recurrent): Secondary | ICD-10-CM

## 2016-07-15 DIAGNOSIS — M129 Arthropathy, unspecified: Secondary | ICD-10-CM

## 2016-07-15 LAB — CBC
HEMATOCRIT: 26.6 % — AB (ref 40.0–52.0)
Hemoglobin: 8.5 g/dL — ABNORMAL LOW (ref 13.0–18.0)
MCH: 26.8 pg (ref 26.0–34.0)
MCHC: 32.1 g/dL (ref 32.0–36.0)
MCV: 83.4 fL (ref 80.0–100.0)
Platelets: 228 10*3/uL (ref 150–440)
RBC: 3.19 MIL/uL — ABNORMAL LOW (ref 4.40–5.90)
RDW: 19.5 % — AB (ref 11.5–14.5)
WBC: 17.1 10*3/uL — ABNORMAL HIGH (ref 3.8–10.6)

## 2016-07-15 LAB — COMPREHENSIVE METABOLIC PANEL
ALBUMIN: 1.4 g/dL — AB (ref 3.5–5.0)
ALK PHOS: 94 U/L (ref 38–126)
ALT: 21 U/L (ref 17–63)
AST: 85 U/L — AB (ref 15–41)
Anion gap: 6 (ref 5–15)
BILIRUBIN TOTAL: 4.5 mg/dL — AB (ref 0.3–1.2)
BUN: 18 mg/dL (ref 6–20)
CO2: 23 mmol/L (ref 22–32)
CREATININE: 0.88 mg/dL (ref 0.61–1.24)
Calcium: 7.1 mg/dL — ABNORMAL LOW (ref 8.9–10.3)
Chloride: 92 mmol/L — ABNORMAL LOW (ref 101–111)
GFR calc Af Amer: >60 mL/min (ref >60)
GFR calc non Af Amer: >60 mL/min (ref >60)
GLUCOSE: 148 mg/dL — AB (ref 65–99)
POTASSIUM: 4.4 mmol/L (ref 3.5–5.1)
Sodium: 121 mmol/L — ABNORMAL LOW (ref 135–145)
TOTAL PROTEIN: 6.2 g/dL — AB (ref 6.5–8.1)

## 2016-07-15 LAB — PROTIME-INR
INR: 2.08
Prothrombin Time: 23.7 seconds — ABNORMAL HIGH (ref 11.4–15.2)

## 2016-07-15 MED ORDER — ENSURE ENLIVE PO LIQD
237.0000 mL | Freq: Three times a day (TID) | ORAL | Status: DC
  Administered 2016-07-15 – 2016-07-21 (×18): 237 mL via ORAL

## 2016-07-15 NOTE — Consult Note (Signed)
Consultation Note Date: 07/16/16  Patient Name: Donald Bowers  DOB: 1955/03/20  MRN: [id number]  Age / Sex: 61 y.o., male  PCP: Dion Body, MD Referring Physician: Fritzi Mandes, MD  Reason for Consultation: Establishing goals of care  HPI/Patient Profile: 61 y.o. male  with past medical history of hepatitis C, ETOH and cocaine abuse, GI bleed, COPD, pneumonia, and arthritis admitted on 07/14/2016 with shortness of breath and abdominal distention. Followed by Dr. Grayland Ormond. Recently diagnosed with hepatocellular carcinoma. AFP >17,000. Paracentesis on 5/9 with 2.2 L removed. In ED, patient with severe ascites and underwent paracentesis with 3.3 L removed. Also with sepsis secondary to recurrent SBP and receiving ceftriaxone. Hypervolemic hyponatremia and receiving IV lasix. Palliative medicine consultation for goals of care.   Clinical Assessment and Goals of Care: I have reviewed medical records, discussed with care team, and met with patient at bedside to discuss diagnosis, prognosis, GOC, EOL wishes, disposition and options. Patient drowsy but able to answer questions appropriately. Complains of generalized pain and just "doesn't feel good." Spoke with patient's son, Donald Bowers via telephone. He is having transportation issues but will be at the hospital in the AM.    Introduced Palliative Medicine as specialized medical care for people living with serious illness. It focuses on providing relief from the symptoms and stress of a serious illness. The goal is to improve quality of life for both the patient and the family.  We discussed a brief life review of the patient. Prior to cancer diagnosis, patient was working full time as a Therapist, music. He is not married. He has two sons--Donald Bowers and Donald Bowers. Cordero moved in with Mr. Auld after diagnosis of cancer. He speaks of his father's decline in  the last few weeks with increased weakness, pain, "no energy", 20 lb weight loss, and poor appetite. Edouard feels his father is "not coping well" with cancer diagnosis and is "depressed" with pain and insomnia.   Discussed hospital diagnoses and interventions. Patient tells me he is "waiting for the fluid to get better" before he starts chemotherapy. Son believes he "will get stronger in the next few weeks" and be able to start chemotherapy and tolerate a diet. Expressed my concern of continued fluid build-up despite multiple paracentesis. Also, him not being at a point where he could tolerate chemotherapy with functional and nutritional status decline. This being more harm than good to him.   Advanced directives, concepts specific to code status, and artifical feeding and hydration were considered and discussed. Mr. Schara does not have a living will or documented HCPOA. Strongly encouraged he consider designating a trusted decision maker if he was not able to make decisions for himself and completing advanced directives with chaplain while hospitalized. He would want son, Donald Bowers to be POA. Discussed code status. Educated on my recommendation for DNR/DNI with cancer and chronic co-morbidities.    I attempted to elicit values and goals of care important to the patient. Son tells me he has been a very hardworking, independent man his entire life.  Encouraged son, Wille to start considering what would be MOST IMPORTANT to his father if he didn't "get stronger" and at a point where he could start chemotherapy. Thunder states "if we can't do chemo or get a new liver then keeping him comfortable" and "helping the pain and lack of sleep" would be most important. Explained that we may have reached this point where we transition to a focus on comfort and symptom management--but needing to get on the same page with his father. Son plans to be at the hospital at 10am tomorrow.       Questions and concerns were  addressed. Emotional support provided.    SUMMARY OF RECOMMENDATIONS    FULL code. Educated on my recommendation for DNR/DNI with underlying cancer.   Discussed with Dr. Posey Pronto and RN--Continue current treatment. Paracentesis today.   Encouraged patient complete advanced directive packet and designate HCPOA while hospitalized.   [place] meeting with patient and son tomorrow, 5/17 _0 . Will further discuss goals, EOL wishes, code status, and options.   Code Status/Advance Care Planning:  Full code   Symptom Management:   Oxycodone 61m PO q4h prn pain  Morphine 266mIV q3h prn severe pain  Trazodone 5067mO HS prn sleep  Palliative Prophylaxis:   Aspiration, Delirium Protocol and Frequent Pain Assessment  Additional Recommendations (Limitations, Scope, Preferences):  Full Scope Treatment  Psycho-social/Spiritual:   Desire for further Chaplaincy support:yes  Additional Recommendations: Caregiving  Support/Resources and Education on Hospice  Prognosis:   Unable to determine: guarded with hepatocellular carcinoma, ascites, recurrent SBP, and functional and nutritional status decline    Discharge Planning: To Be Determined eligible for hospice      Primary Diagnoses: Present on Admission: . Ascitic fluid   I have reviewed the medical record, interviewed the patient and family, and examined the patient. The following aspects are pertinent.  Past Medical History:  Diagnosis Date  . Arthritis   . Cocaine abuse   . COPD [place] ETOH abuse   . GI bleed   . Hepatitis C   . Pneumonia    Social History   Social History  . Marital status: Divorced    Spouse name: N/A  . Number of children: N/A  . Years of education: N/A   Social History Main Topics  . Smoking status: Former Smoker    Packs/day: 0.50    Types: Cigarettes  . Smokeless tobacco: Never Used     Comment: pt said he has all the information he needs   .  Alcohol use 7.2 oz/week    12 Cans of beer per week     Comment: weekly/daily, pt report drinking 2 can in 2weeks  . Drug use: Yes    Types: "Crack" cocaine     Comment: Last dose Wednesday 06/18/2016  . Sexual activity: Not Asked   Other Topics Concern  . None   Social History Narrative  . None   Family History  Problem Relation Age of Onset  . COPD Mother   . Diabetes Sister   . Breast cancer Sister   . Cancer Father        back cancer  . Breast cancer Maternal Aunt   . Breast cancer Paternal Uncle    Scheduled Meds: . feeding supplement (ENSURE ENLIVE)  237 mL Oral TID BM  . furosemide  20 mg Intravenous Q8H  . pantoprazole  40 mg Oral Daily  . senna-docusate  1 tablet Oral QHS  .  sodium chloride flush  3 mL Intravenous Q12H   Continuous Infusions: . cefTRIAXone (ROCEPHIN)  IV Stopped (07/15/16 1450)   PRN Meds:.acetaminophen **OR** acetaminophen, albuterol, morphine injection, ondansetron **OR** ondansetron (ZOFRAN) IV, oxyCODONE, polyethylene glycol, traZODone Medications Prior to Admission:  Prior to Admission medications   Medication Sig Start Date End Date Taking? Authorizing Provider  amoxicillin-clavulanate (AUGMENTIN) 875-125 MG tablet Take 1 tablet by mouth 2 (two) times daily.   Yes [provider]  folic acid (FOLVITE) 1 MG tablet Take 1 tablet (1 mg total) by mouth daily. 06/24/16  Yes Fritzi Mandes, MD  furosemide (LASIX) 20 MG tablet Take 1 tablet (20 mg total) by mouth daily. 07/02/16  Yes Lloyd Huger, MD  pantoprazole (PROTONIX) 40 MG tablet Take 1 tablet (40 mg total) by mouth daily. 06/24/16  Yes Fritzi Mandes, MD  Multiple Vitamin (MULTIVITAMIN WITH MINERALS) TABS tablet Take 1 tablet by mouth daily. Patient not taking: Reported on 07/14/2016 06/24/16   Fritzi Mandes, MD  OxyCODONE HCl, Abuse Deter, (OXAYDO) 5 MG TABA Take 5 mg by mouth 2 (two) times daily as needed. Patient not taking: Reported on 07/14/2016 07/02/16   Lloyd Huger, MD    sucralfate (CARAFATE) 1 GM/10ML suspension Take 10 mLs (1 g total) by mouth 4 (four) times daily -  with meals and at bedtime. Patient not taking: Reported on 07/14/2016 06/24/16   Fritzi Mandes, MD  traMADol (ULTRAM) 50 MG tablet Take 1 tablet (50 mg total) by mouth every 6 (six) hours as needed for moderate pain. 07/09/16   Lloyd Huger, MD   No Known Allergies Review of Systems  Constitutional: Positive for activity change, appetite change and fatigue.  Respiratory: Positive for shortness of breath.   Gastrointestinal: Positive for abdominal distention and abdominal pain.   Physical Exam  Constitutional: He is oriented to person, place, and time. He is cooperative. He appears ill.  HENT:  Head: Normocephalic and atraumatic.  Cardiovascular: Regular rhythm.   Pulmonary/Chest:  Intermittent dyspnea at rest  Abdominal: He exhibits distension and ascites. There is tenderness.  Neurological: He is alert and oriented to person, place, and time.  Skin: Skin is warm and dry.  Psychiatric: He has a normal mood and affect. His speech is normal and behavior is normal.  Nursing note and vitals reviewed.  Vital Signs: BP (!) 113/58 (BP Location: Right Arm)   Pulse (!) 109   Temp 99.5 F (37.5 C) (Oral)   Resp 18   Ht 6' (1.829 m)   Wt 78.7 kg (173 lb 6.4 oz)   SpO2 94%   BMI 23.52 kg/m  Pain Assessment: 0-10   Pain Score: 5   SpO2: SpO2: 94 % O2 Device:SpO2: 94 % O2 Flow Rate: .O2 Flow Rate (L/min): 2 L/min  IO: Intake/output summary:   Intake/Output Summary (Last 24 hours) at 07/16/16 1257 Last data filed at 07/16/16 0800  Gross per 24 hour  Intake              600 ml  Output                0 ml  Net              600 ml    LBM: Last BM Date: 07/15/16 Baseline Weight: Weight: 86.2 kg (190 lb) Most recent weight: Weight: 78.7 kg (173 lb 6.4 oz)     Palliative Assessment/Data: PPS 50%   Flowsheet Rows     Most Recent Value  Intake Tab  Referral Department   Hospitalist  Unit at Time of Referral  Oncology Unit  Palliative Care Primary Diagnosis  Cancer  Date Notified  07/15/16  Palliative Care Type  New Palliative care  Reason for referral  Clarify Goals of Care  Date of Admission  07/14/16  Date first seen by Palliative Care  07/15/16  # of days IP prior to Palliative referral  1  Clinical Assessment  Palliative Performance Scale Score  50%  Psychosocial & Spiritual Assessment  Palliative Care Outcomes  Patient/Family meeting held?  Yes  Who was at the meeting?  patient and son via telephone  Palliative Care Outcomes  Clarified goals of care, Provided psychosocial or spiritual support, Counseled regarding hospice, ACP counseling assistance     Time In: 1100 Time Out: 1230 Time Total: 24mn Greater than 50%  of this time was spent counseling and coordinating care related to the above assessment and plan.  Signed by:  MIhor Dow FNP-C Palliative Medicine Team  Phone: [phone]: [phone]  Please contact Palliative Medicine Team phone at [phone] questions and concerns.  For individual provider: See AShea Evans

## 2016-07-15 NOTE — Consult Note (Signed)
[name], MD [facility]  [address] [place], [place] [address] Phone: [phone] Fax : [phone]  Consultation  Referring Provider:     Dr. Posey Pronto Primary Care Physician:  Dion Body, MD Primary Gastroenterologist:           Reason for Consultation:     Ascites  Date of Admission:  07/14/2016 Date of Consultation:  07/15/2016         HPI:   Donald Bowers is a 61 y.o. male who was diagnosed with hepatis of a carcinoma and has a history of hepatitis C. The patient was admitted with increasing abdominal pain. The patient also has a history of alcohol abuse. The patient had presented to the ER with a distended abdomen abdominal pain and shortness of breath. The patient had a paracentesis on May 9 with 2.2 L removed. The patient had a repeat paracentesis on this admission which showed him to have over 10,000 white cells in the fluid of which 100% were neutrophils consistent with spontaneous bacterial peritonitis. The patient reports that he feels poorly and has significant abdominal pain.  Past Medical History:  Diagnosis Date  . Arthritis   . Cocaine abuse   . COPD (chronic obstructive pulmonary disease) ([place])   . ETOH abuse   . GI bleed   . Hepatitis C   . Pneumonia     Past Surgical History:  Procedure Laterality Date  . CARDIAC CATHETERIZATION    . ESOPHAGOGASTRODUODENOSCOPY (EGD) WITH PROPOFOL N/A 06/21/2016   Procedure: ESOPHAGOGASTRODUODENOSCOPY (EGD) WITH PROPOFOL;  Surgeon: Wilford Corner, MD;  Location: [facility] ENDOSCOPY;  Service: Endoscopy;  Laterality: N/A;    Prior to Admission medications   Medication Sig Start Date End Date Taking? Authorizing Provider  amoxicillin-clavulanate (AUGMENTIN) 875-125 MG tablet Take 1 tablet by mouth 2 (two) times daily.   Yes [provider]  folic acid (FOLVITE) 1 MG tablet Take 1 tablet (1 mg total) by mouth daily. 06/24/16  Yes Fritzi Mandes, MD  furosemide (LASIX) 20 MG tablet Take 1 tablet (20 mg  total) by mouth daily. 07/02/16  Yes Lloyd Huger, MD  pantoprazole (PROTONIX) 40 MG tablet Take 1 tablet (40 mg total) by mouth daily. 06/24/16  Yes Fritzi Mandes, MD  Multiple Vitamin (MULTIVITAMIN WITH MINERALS) TABS tablet Take 1 tablet by mouth daily. Patient not taking: Reported on 07/14/2016 06/24/16   Fritzi Mandes, MD  OxyCODONE HCl, Abuse Deter, (OXAYDO) 5 MG TABA Take 5 mg by mouth 2 (two) times daily as needed. Patient not taking: Reported on 07/14/2016 07/02/16   Lloyd Huger, MD  sucralfate (CARAFATE) 1 GM/10ML suspension Take 10 mLs (1 g total) by mouth 4 (four) times daily -  with meals and at bedtime. Patient not taking: Reported on 07/14/2016 06/24/16   Fritzi Mandes, MD  traMADol (ULTRAM) 50 MG tablet Take 1 tablet (50 mg total) by mouth every 6 (six) hours as needed for moderate pain. 07/09/16   Lloyd Huger, MD    Family History  Problem Relation Age of Onset  . COPD Mother   . Diabetes Sister   . Breast cancer Sister   . Cancer Father        back cancer  . Breast cancer Maternal Aunt   . Breast cancer Paternal Uncle      Social History  Substance Use Topics  . Smoking status: Former Smoker    Packs/day: 0.50    Types: Cigarettes  . Smokeless tobacco: Never Used  Comment: pt said he has all the information he needs   . Alcohol use 7.2 oz/week    12 Cans of beer per week     Comment: weekly/daily, pt report drinking 2 can in 2weeks    Allergies as of 07/14/2016  . (No Known Allergies)    Review of Systems:    All systems reviewed and negative except where noted in HPI.   Physical Exam:  Vital signs in last 24 hours: Temp:  [98.4 F (36.9 C)-103.1 F (39.5 C)] 99 F (37.2 C) (05/15 1339) Pulse Rate:  [112-119] 117 (05/15 1339) Resp:  [16-20] 16 (05/15 1339) BP: (109-150)/(55-64) 117/55 (05/15 1339) SpO2:  [95 %-98 %] 95 % (05/15 1339) Last BM Date: 07/15/16 General:   Pleasant, cooperative in NAD Head:  Normocephalic and atraumatic. Eyes:    No icterus.   Conjunctiva pink. PERRLA. Ears:  Normal auditory acuity. Neck:  Supple; no masses or thyroidomegaly Lungs: Respirations even and unlabored. Lungs clear to auscultation bilaterally.   No wheezes, crackles, or rhonchi.  Heart:  Regular rate and rhythm;  Without murmur, clicks, rubs or gallops Abdomen:  Soft, nondistended, nontender. Normal bowel sounds. No appreciable masses or hepatomegaly.  No rebound or guarding.  Rectal:  Not performed. Msk:  Symmetrical without gross deformities.    Extremities:  Without edema, cyanosis or clubbing. Neurologic:  Alert and oriented x3;  grossly normal neurologically. Skin:  Intact without significant lesions or rashes. Cervical Nodes:  No significant cervical adenopathy. Psych:  Alert and cooperative. Normal affect.  LAB RESULTS:  Recent Labs  07/14/16 0924 07/15/16 0557  WBC 17.4* 17.1*  HGB 8.9* 8.5*  HCT 27.8* 26.6*  PLT 284 228   BMET  Recent Labs  07/14/16 0924 07/15/16 0557  NA 121* 121*  K 4.3 4.4  CL 92* 92*  CO2 24 23  GLUCOSE 150* 148*  BUN 16 18  CREATININE 0.96 0.88  CALCIUM 7.5* 7.1*   LFT  Recent Labs  07/15/16 0557  PROT 6.2*  ALBUMIN 1.4*  AST 85*  ALT 21  ALKPHOS 94  BILITOT 4.5*   PT/INR  Recent Labs  07/14/16 0924 07/15/16 0557  LABPROT 22.2* 23.7*  INR 1.92 2.08    STUDIES: Dg Chest 1 View  Result Date: 07/14/2016 CLINICAL DATA:  Increasing shortness of breath, increasing ascites. Recently diagnosis with malignancy of the liver and has not begun treatment. EXAM: CHEST 1 VIEW COMPARISON:  Portable chest x-ray of June 22, 2016 FINDINGS: The lungs are less well inflated today. The interstitial markings remain increased. The cardiac silhouette is enlarged. The central pulmonary vascularity is prominent. There is calcification in the wall of the aortic arch. The bony thorax exhibits no acute abnormality. IMPRESSION: Mild pulmonary interstitial edema accentuated by mild hypoinflation.  Mild cardiomegaly. No acute pneumonia. Thoracic aortic atherosclerosis. Electronically Signed   By: David  Martinique M.D.   On: 07/14/2016 09:51   US Paracentesis  Result Date: 07/14/2016 INDICATION: Abdominal distension, hepatitis-C, cirrhosis, hepatic mass EXAM: ULTRASOUND GUIDED RIGHT PARACENTESIS MEDICATIONS: 1% LIDOCAINE LOCALLY COMPLICATIONS: None immediate. PROCEDURE: Informed written consent was obtained from the patient after a discussion of the risks, benefits and alternatives to treatment. A timeout was performed prior to the initiation of the procedure. Initial ultrasound scanning demonstrates a large amount of ascites within the right lower abdominal quadrant. The right lower abdomen was prepped and draped in the usual sterile fashion. 1% lidocaine with epinephrine was used for local anesthesia. Following this, a 6 Fr Safe-T-Centesis  catheter was introduced. An ultrasound image was saved for documentation purposes. The paracentesis was performed. The catheter was removed and a dressing was applied. The patient tolerated the procedure well without immediate post procedural complication. FINDINGS: A total of approximately 3.3 L of CLEAR PERITONEAL fluid was removed. Samples were sent to the laboratory as requested by the clinical team. IMPRESSION: Successful ultrasound-guided paracentesis yielding 3.3 liters of peritoneal fluid. Electronically Signed   By: Jerilynn Mages.  Shick M.D.   On: 07/14/2016 14:08      Impression / Plan:   Donald Bowers is a 61 y.o. y/o male with Hepatocellular carcinoma and SBP with a high white cell count and the cultures showed no growth as of yet. The patient is now on antibiotics for the SBP. The patient has a poor prognosis with his history of cirrhosis hepatitis C and hepatic failure carcinoma. The patient should be continued on his antibiotics and continue prophylactic antibiotic as an outpatient for his SBP. Nothing further to do from a GI point of view except treat the  SBP.   Thank you for involving me in the care of this patient.      LOS: 1 day   [name], MD  07/15/2016, 4:42 PM   Note: This dictation was prepared with Dragon dictation along with smaller phrase technology. Any transcriptional errors that result from this process are unintentional.

## 2016-07-15 NOTE — Progress Notes (Signed)
Pt states he "feels full and uncomfortable like I did yesterday when I came in before they took off the fluid" Abdomen is distended and tender to touch.   Dr. Posey Pronto aware. Palliative consulted. PRN pain medication given as ordered with some noted relief. Pt up to chair as it is easier for him to breathe sitting up.

## 2016-07-15 NOTE — Consult Note (Signed)
[place]  [phone] [phone]  ID: Donald Bowers OB: 1956/01/10  MR#: [id number]  MWU#:[id number]  Patient Care Team: Dion Body, MD as PCP - General (Family Medicine) [name], RN as Registered Nurse  CHIEF COMPLAINT: Hepatocellular carcinoma, declining performance status, increasing ascites.  INTERVAL HISTORY: Patient is a 61 year old male who was recently diagnosed with hepatocellular carcinoma who presented to the emergency room with worsening ascites, declining performance status, and increasing shortness of breath. He underwent paracentesis which removed 2.2 L of fluid with only mild improvement of his symptoms. He continues to complain of weakness and fatigue. He has no neurologic complaints. He denies any fevers. He has a poor appetite, but denies weight loss. He has no nausea, vomiting, constipation, or diarrhea. He has no urinary complaints. Patient feels generally terrible, but offers no further specific complaints.  REVIEW OF SYSTEMS:   Review of Systems  Constitutional: Positive for malaise/fatigue. Negative for fever and weight loss.  Respiratory: Positive for shortness of breath. Negative for cough and hemoptysis.   Cardiovascular: Negative.  Negative for chest pain and leg swelling.  Gastrointestinal: Negative for abdominal pain, blood in stool, constipation, diarrhea, melena and vomiting.  Genitourinary: Negative.   Musculoskeletal: Negative.   Skin: Negative for itching and rash.       Jaundice  Neurological: Positive for weakness. Negative for sensory change.  Psychiatric/Behavioral: Negative.  The patient is not nervous/anxious.     As per HPI. Otherwise, a complete review of systems is negative.  PAST MEDICAL HISTORY: Past Medical History:  Diagnosis Date  . Arthritis   . Cocaine abuse   . COPD (chronic obstructive pulmonary disease) ([place])   . ETOH abuse   . GI bleed   . Hepatitis C   .  Pneumonia     PAST SURGICAL HISTORY: Past Surgical History:  Procedure Laterality Date  . CARDIAC CATHETERIZATION    . ESOPHAGOGASTRODUODENOSCOPY (EGD) WITH PROPOFOL N/A 06/21/2016   Procedure: ESOPHAGOGASTRODUODENOSCOPY (EGD) WITH PROPOFOL;  Surgeon: Wilford Corner, MD;  Location: [facility] ENDOSCOPY;  Service: Endoscopy;  Laterality: N/A;    FAMILY HISTORY: Family History  Problem Relation Age of Onset  . COPD Mother   . Diabetes Sister   . Breast cancer Sister   . Cancer Father        back cancer  . Breast cancer Maternal Aunt   . Breast cancer Paternal Uncle     ADVANCED DIRECTIVES (Y/N):  @ADVDIR @  HEALTH MAINTENANCE: Social History  Substance Use Topics  . Smoking status: Former Smoker    Packs/day: 0.50    Types: Cigarettes  . Smokeless tobacco: Never Used     Comment: pt said he has all the information he needs   . Alcohol use 7.2 oz/week    12 Cans of beer per week     Comment: weekly/daily, pt report drinking 2 can in 2weeks     Colonoscopy:  PAP:  Bone density:  Lipid panel:  No Known Allergies  Current Facility-Administered Medications  Medication Dose Route Frequency Provider Last Rate Last Dose  . acetaminophen (TYLENOL) tablet 650 mg  650 mg Oral Q6H PRN Hillary Bow, MD   650 mg at 07/14/16 2021   Or  . acetaminophen (TYLENOL) suppository 650 mg  650 mg Rectal Q6H PRN Sudini, Alveta Heimlich, MD      . albuterol (PROVENTIL) (2.5 MG/3ML) 0.083% nebulizer solution 2.5 mg  2.5 mg Nebulization Q2H PRN Hillary Bow, MD      . cefTRIAXone (  ROCEPHIN) 2 g in dextrose 5 % 50 mL IVPB  2 g Intravenous Q24H Hillary Bow, MD   Stopped at 07/15/16 1450  . feeding supplement (ENSURE ENLIVE) (ENSURE ENLIVE) liquid 237 mL  237 mL Oral TID BM Fritzi Mandes, MD   237 mL at 07/15/16 2041  . furosemide (LASIX) tablet 40 mg  40 mg Oral Daily Hillary Bow, MD   40 mg at 07/15/16 0847  . morphine 2 MG/ML injection 2 mg  2 mg Intravenous Q4H PRN Hillary Bow, MD   2 mg at  07/15/16 2039  . ondansetron (ZOFRAN) tablet 4 mg  4 mg Oral Q6H PRN Hillary Bow, MD       Or  . ondansetron (ZOFRAN) injection 4 mg  4 mg Intravenous Q6H PRN Sudini, Srikar, MD      . oxyCODONE (Oxy IR/ROXICODONE) immediate release tablet 5 mg  5 mg Oral Q4H PRN Sudini, Srikar, MD      . pantoprazole (PROTONIX) EC tablet 40 mg  40 mg Oral Daily Hillary Bow, MD   40 mg at 07/15/16 0847  . polyethylene glycol (MIRALAX / GLYCOLAX) packet 17 g  17 g Oral Daily PRN Sudini, Alveta Heimlich, MD      . sodium chloride flush (NS) 0.9 % injection 3 mL  3 mL Intravenous Q12H Hillary Bow, MD   3 mL at 07/15/16 2044  . traZODone (DESYREL) tablet 50 mg  50 mg Oral QHS PRN Hugelmeyer, Alexis, DO   50 mg at 07/14/16 2158    OBJECTIVE: Vitals:   07/15/16 1339 07/15/16 1941  BP: (!) 117/55 140/69  Pulse: (!) 117 (!) 120  Resp: 16 16  Temp: 99 F (37.2 C) 99.1 F (37.3 C)     Body mass index is 25.77 kg/m.    ECOG FS:2 - Symptomatic, <50% confined to bed  General: Well-developed, well-nourished, no acute distress. Eyes: Pink conjunctiva, mildly icteric sclera. HEENT: Normocephalic, moist mucous membranes, clear oropharnyx. Lungs: Clear to auscultation bilaterally. Heart: Regular rate and rhythm. No rubs, murmurs, or gallops. Abdomen: Distended, mildly tender. Musculoskeletal: No edema, cyanosis, or clubbing. Neuro: Alert, answering all questions appropriately. Cranial nerves grossly intact. Skin: Mild jaundice. Psych: Normal affect. Lymphatics: No cervical, calvicular, axillary or inguinal LAD.   LAB RESULTS:  Lab Results  Component Value Date   NA 121 (L) 07/15/2016   K 4.4 07/15/2016   CL 92 (L) 07/15/2016   CO2 23 07/15/2016   GLUCOSE 148 (H) 07/15/2016   BUN 18 07/15/2016   CREATININE 0.88 07/15/2016   CALCIUM 7.1 (L) 07/15/2016   PROT 6.2 (L) 07/15/2016   ALBUMIN 1.4 (L) 07/15/2016   AST 85 (H) 07/15/2016   ALT 21 07/15/2016   ALKPHOS 94 07/15/2016   BILITOT 4.5 (H) 07/15/2016    GFRNONAA >60 07/15/2016   GFRAA >60 07/15/2016    Lab Results  Component Value Date   WBC 17.1 (H) 07/15/2016   NEUTROABS 8.3 (H) 07/02/2016   HGB 8.5 (L) 07/15/2016   HCT 26.6 (L) 07/15/2016   MCV 83.4 07/15/2016   PLT 228 07/15/2016     STUDIES: Dg Chest 1 View  Result Date: 07/14/2016 CLINICAL DATA:  Increasing shortness of breath, increasing ascites. Recently diagnosis with malignancy of the liver and has not begun treatment. EXAM: CHEST 1 VIEW COMPARISON:  Portable chest x-ray of June 22, 2016 FINDINGS: The lungs are less well inflated today. The interstitial markings remain increased. The cardiac silhouette is enlarged. The central pulmonary vascularity is prominent. There  is calcification in the wall of the aortic arch. The bony thorax exhibits no acute abnormality. IMPRESSION: Mild pulmonary interstitial edema accentuated by mild hypoinflation. Mild cardiomegaly. No acute pneumonia. Thoracic aortic atherosclerosis. Electronically Signed   By: David  Martinique M.D.   On: 07/14/2016 09:51   Dg Chest 1 View  Result Date: 06/22/2016 CLINICAL DATA:  Patient with history of ascites. Worsening shortness of breath. EXAM: CHEST 1 VIEW COMPARISON:  Chest radiograph 05/20/2016. FINDINGS: Low lung volumes. Stable prominent cardiac and mediastinal contours. Minimal heterogeneous opacities lung bases bilaterally. No pleural effusion or pneumothorax. Old right clavicle fracture. IMPRESSION: Low lung volumes with basilar heterogeneous opacities favored to represent atelectasis. Electronically Signed   By: Lovey Newcomer M.D.   On: 06/22/2016 09:30   Mr Liver W Wo Contrast  Result Date: 06/23/2016 CLINICAL DATA:  Hepatitis C, nausea/ vomiting, abnormal liver on prior CT EXAM: MRI ABDOMEN WITHOUT AND WITH CONTRAST TECHNIQUE: Multiplanar multisequence MR imaging of the abdomen was performed both before and after the administration of intravenous contrast. CONTRAST:  8 mL Eovist IV COMPARISON:  CT  abdomen/pelvis dated 05/19/2016 FINDINGS: Motion degraded images. Lower chest: Small right pleural effusion. Associated right lower lobe atelectasis. Hepatobiliary: Cirrhosis. 4.7 cm heterogeneously enhancing lesion in segment 4A (series 6/image 14), highly suspicious for HCC. Additional 13.2 cm heterogeneously enhancing lesion centered in segment 7 (series 6/ image 32), also highly suspicious for HCC with associated right portal vein thrombosis (series 10/ image 37). Gallbladder is notable for layering sludge (series 15/image 18). No associated inflammatory changes. Pancreas:  Grossly unremarkable. Spleen: Enlarged, measuring 16.0 cm in maximal craniocaudal dimension. Adrenals/Urinary Tract:  Adrenal glands are within normal limits. Kidneys are grossly unremarkable.  No hydronephrosis. Stomach/Bowel: Stomach and visualized bowel are grossly unremarkable. Vascular/Lymphatic: Thrombus extends into the main portal vein (series 17/image 21) and to the portosplenic confluence (series 17/ image 39) and proximal SMV (series 17/image 48). Gastroesophageal varices.  Recanalized periumbilical vein. Small upper abdominal lymph nodes, likely reactive. Other:  Small volume abdominal ascites. Musculoskeletal: No focal osseous lesions. IMPRESSION: Motion degraded images, limiting evaluation. Two dominant lesions in segment 4A and segment 7, as described above, suspicious for multifocal HCC. 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 Electronically Signed   By: Julian Hy M.D.   On: 06/23/2016 13:29   US Abdomen Limited  Result Date: 06/22/2016 CLINICAL DATA:  Evaluate for ascites EXAM: LIMITED ABDOMEN ULTRASOUND FOR ASCITES TECHNIQUE: Limited ultrasound survey for ascites was performed in all four abdominal quadrants. COMPARISON:  Ultrasound 06/20/2016 FINDINGS: Small volume ascites within the right upper, right lower and left lower  quadrants. IMPRESSION: Small volume ascites within the right upper, right lower and left lower quadrants. Electronically Signed   By: Lovey Newcomer M.D.   On: 06/22/2016 09:12   US Biopsy  Result Date: 07/10/2016 CLINICAL DATA:  Cirrhosis, hepatitis-C and liver masses in right and left lobes. Ascites present by prior imaging. EXAM: 1. ULTRASOUND-GUIDED PARACENTESIS 2. ULTRASOUND GUIDED CORE BIOPSY OF LIVER MASS MEDICATIONS: 1.0 mg IV Versed; 50 mcg IV Fentanyl Total Moderate Sedation Time: 19 minutes. The patient's level of consciousness and physiologic status were continuously monitored during the procedure by Radiology nursing. PROCEDURE: The procedure, risks, benefits, and alternatives were explained to the patient. Questions regarding the procedure were encouraged and answered. The patient understands and consents to the procedure. A time out was performed prior to initiating the procedure. The abdominal wall was prepped with chlorhexidine  in a sterile fashion, and a sterile drape was applied covering the operative field. A sterile gown and sterile gloves were used for the procedure. Local anesthesia was provided with 1% Lidocaine. Ultrasound was performed of the abdomen. Initial paracentesis was performed to reduce bleeding risk of liver biopsy. After localizing ascites in the right lower quadrant, a 6 French Safe-T-Centesis catheter was inserted into the peritoneal cavity. Paracentesis was performed utilizing vacuum bottles. A fluid sample was sent for cytologic analysis. Mass lesion within the right lobe of the liver was localized by ultrasound. Under direct ultrasound guidance, a 17 gauge needle was advanced into the right lobe of the liver. Two separate coaxial 18 gauge core biopsy samples were obtained and submitted in formalin. As the outer needle was retracted, a Gel-Foam slurry was injected. COMPLICATIONS: None. FINDINGS: Moderate ascites is present in the peritoneal cavity, increased in volume since  prior ultrasound imaging on 06/22/2016. Fluid is seen surrounding the liver. In order to decrease bleeding risk during liver biopsy, paracentesis was performed prior to planned liver biopsy. Clear, yellowish fluid was obtained during paracentesis. Ultrasound shows minimal fluid remain after paracentesis. Infiltrative, ill-defined heterogeneous mass is seen throughout much of the right lobe of the liver. Solid tissue was obtained. IMPRESSION: Ultrasound-guided paracentesis and liver biopsy. Paracentesis yielded 2.2 L of fluid with a sample sent for cytologic analysis. Core biopsy of the liver was performed at the level of the large heterogeneous mass occupying much of the right lobe of the liver. Electronically Signed   By: Aletta Edouard M.D.   On: 07/10/2016 10:23   US Paracentesis  Result Date: 07/14/2016 INDICATION: Abdominal distension, hepatitis-C, cirrhosis, hepatic mass EXAM: ULTRASOUND GUIDED RIGHT PARACENTESIS MEDICATIONS: 1% LIDOCAINE LOCALLY COMPLICATIONS: None immediate. PROCEDURE: Informed written consent was obtained from the patient after a discussion of the risks, benefits and alternatives to treatment. A timeout was performed prior to the initiation of the procedure. Initial ultrasound scanning demonstrates a large amount of ascites within the right lower abdominal quadrant. The right lower abdomen was prepped and draped in the usual sterile fashion. 1% lidocaine with epinephrine was used for local anesthesia. Following this, a 6 Fr Safe-T-Centesis catheter was introduced. An ultrasound image was saved for documentation purposes. The paracentesis was performed. The catheter was removed and a dressing was applied. The patient tolerated the procedure well without immediate post procedural complication. FINDINGS: A total of approximately 3.3 L of CLEAR PERITONEAL fluid was removed. Samples were sent to the laboratory as requested by the clinical team. IMPRESSION: Successful ultrasound-guided  paracentesis yielding 3.3 liters of peritoneal fluid. Electronically Signed   By: Jerilynn Mages.  Shick M.D.   On: 07/14/2016 14:08   US Paracentesis  Result Date: 07/10/2016 CLINICAL DATA:  Cirrhosis, hepatitis-C and liver masses in right and left lobes. Ascites present by prior imaging. EXAM: 1. ULTRASOUND-GUIDED PARACENTESIS 2. ULTRASOUND GUIDED CORE BIOPSY OF LIVER MASS MEDICATIONS: 1.0 mg IV Versed; 50 mcg IV Fentanyl Total Moderate Sedation Time: 19 minutes. The patient's level of consciousness and physiologic status were continuously monitored during the procedure by Radiology nursing. PROCEDURE: The procedure, risks, benefits, and alternatives were explained to the patient. Questions regarding the procedure were encouraged and answered. The patient understands and consents to the procedure. A time out was performed prior to initiating the procedure. The abdominal wall was prepped with chlorhexidine in a sterile fashion, and a sterile drape was applied covering the operative field. A sterile gown and sterile gloves were used for the procedure. Local anesthesia was  provided with 1% Lidocaine. Ultrasound was performed of the abdomen. Initial paracentesis was performed to reduce bleeding risk of liver biopsy. After localizing ascites in the right lower quadrant, a 6 French Safe-T-Centesis catheter was inserted into the peritoneal cavity. Paracentesis was performed utilizing vacuum bottles. A fluid sample was sent for cytologic analysis. Mass lesion within the right lobe of the liver was localized by ultrasound. Under direct ultrasound guidance, a 17 gauge needle was advanced into the right lobe of the liver. Two separate coaxial 18 gauge core biopsy samples were obtained and submitted in formalin. As the outer needle was retracted, a Gel-Foam slurry was injected. COMPLICATIONS: None. FINDINGS: Moderate ascites is present in the peritoneal cavity, increased in volume since prior ultrasound imaging on 06/22/2016. Fluid is  seen surrounding the liver. In order to decrease bleeding risk during liver biopsy, paracentesis was performed prior to planned liver biopsy. Clear, yellowish fluid was obtained during paracentesis. Ultrasound shows minimal fluid remain after paracentesis. Infiltrative, ill-defined heterogeneous mass is seen throughout much of the right lobe of the liver. Solid tissue was obtained. IMPRESSION: Ultrasound-guided paracentesis and liver biopsy. Paracentesis yielded 2.2 L of fluid with a sample sent for cytologic analysis. Core biopsy of the liver was performed at the level of the large heterogeneous mass occupying much of the right lobe of the liver. Electronically Signed   By: Aletta Edouard M.D.   On: 07/10/2016 10:23   US Abdomen Limited Ruq  Result Date: 06/20/2016 CLINICAL DATA:  Hematemesis, abdominal pain. EXAM: US ABDOMEN LIMITED - RIGHT UPPER QUADRANT COMPARISON:  CT scan of May 19, 2016. Ultrasound of May 19, 2016. FINDINGS: Gallbladder: No gallstones are noted, but mild gallbladder wall thickening at 4 mm is noted. No pericholecystic fluid or sonographic Murphy's sign is noted. Common bile duct: Diameter: 4.8 mm which is within normal limits. Liver: Nodular hepatic contours are noted consistent with hepatic cirrhosis. At least 2 hypoechoic masses are noted, with the largest measuring 3.5 cm posteriorly in right hepatic lobe. These are concerning for metastatic disease. The distal portal vein appears to be thrombosed with hepatofugal flow seen proximally. IMPRESSION: Mild gallbladder wall thickening is noted most likely due to adjacent hepatocellular disease. Multiple hepatic masses are noted concerning for metastatic disease. Findings consistent with hepatic cirrhosis. Thrombosis of portal vein is noted. These results will be called to the ordering clinician or representative by the Radiologist Assistant, and communication documented in the PACS or zVision Dashboard. Electronically Signed   By:  Marijo Conception, M.D.   On: 06/20/2016 10:50    ASSESSMENT: Hepatocellular carcinoma, declining performance status, increasing ascites.  PLAN:    1. Hepatocellular carcinoma: Recently confirmed by biopsy. Patient's AFP is also greater than 17,000. Given his declining performance status as well as his worsening liver function including bilirubin greater than 4.0 treatment would be difficult at this time. Patient presents understanding and agreed to reevaluate and 24-48 hours. No further intervention is needed. Agree with palliative care consult. 2. Ascites/SBP: Secondary to worsening liver failure. Appreciate GI input. Continue current antibiotics and supportive care. Patient may require repeat paracentesis in the next several days. 3. Leukocytosis: Possibly secondary to underlying infection, monitor. 4. Hyponatremia: Multifactorial including worsening ascites and liver dysfunction. 5. Anemia: Patient has a history of GI bleed, monitor.  Appreciate consult, will follow.  Lloyd Huger, MD   07/15/2016 10:10 PM

## 2016-07-15 NOTE — Progress Notes (Addendum)
[place] [facility] [place] NAME: Donald Bowers    MR#:  [id number]  DATE OF BIRTH:  02-18-1956  Came in with increasing weakness and abdominal distention and shortness of breath REVIEW OF SYSTEMS:   Review of Systems  Constitutional: Negative for chills, fever and weight loss.  HENT: Negative for ear discharge, ear pain and nosebleeds.   Eyes: Negative for blurred vision, pain and discharge.  Respiratory: Positive for shortness of breath. Negative for sputum production, wheezing and stridor.   Cardiovascular: Positive for orthopnea. Negative for chest pain, palpitations and PND.  Gastrointestinal: Positive for abdominal pain. Negative for diarrhea, nausea and vomiting.  Genitourinary: Negative for frequency and urgency.  Musculoskeletal: Negative for back pain and joint pain.  Neurological: Positive for weakness. Negative for sensory change, speech change and focal weakness.  Psychiatric/Behavioral: Negative for depression and hallucinations. The patient is not nervous/anxious.    Tolerating Diet:yes Tolerating PT: pending  DRUG ALLERGIES:  No Known Allergies  VITALS:  Blood pressure 109/62, pulse (!) 112, temperature 99.3 F (37.4 C), temperature source Oral, resp. rate 20, height 6' (1.829 m), weight 86.2 kg (190 lb), SpO2 97 %.  PHYSICAL EXAMINATION:   Physical Exam  GENERAL:  61 y.o.-year-old patient lying in the bed with no acute distress. Chronically ill  EYES: Pupils equal, round, reactive to light and accommodation.+ scleral icterus. Extraocular muscles intact.  HEENT: Head atraumatic, normocephalic. Oropharynx and nasopharynx clear.  oral mucosa dry  NECK:  Supple, no jugular venous distention. No thyroid enlargement, no tenderness.  LUNGS: Normal breath sounds bilaterally, no wheezing, rales, rhonchi. No use of accessory muscles of respiration.  CARDIOVASCULAR: S1, S2 normal. No murmurs, rubs, or gallops.  ABDOMEN:  Soft, nontender, severely distended. Bowel sounds present. No organomegaly or mass. Ascites++  EXTREMITIES: No cyanosis, clubbing or edema b/l.    NEUROLOGIC: Cranial nerves II through XII are intact. No focal Motor or sensory deficits b/l.   PSYCHIATRIC:  patient is alert and oriented x 3.  SKIN: No obvious rash, lesion, or ulcer.   LABORATORY PANEL:  CBC  Recent Labs Lab 07/15/16 0557  WBC 17.1*  HGB 8.5*  HCT 26.6*  PLT 228    Chemistries   Recent Labs Lab 07/14/16 0924 07/15/16 0557  NA 121* 121*  K 4.3 4.4  CL 92* 92*  CO2 24 23  GLUCOSE 150* 148*  BUN 16 18  CREATININE 0.96 0.88  CALCIUM 7.5* 7.1*  MG 1.9  --   AST 119* 85*  ALT 25 21  ALKPHOS 121 94  BILITOT 5.8* 4.5*   Cardiac Enzymes  Recent Labs Lab 07/14/16 0924  TROPONINI <0.03   RADIOLOGY:  Dg Chest 1 View  Result Date: 07/14/2016 CLINICAL DATA:  Increasing shortness of breath, increasing ascites. Recently diagnosis with malignancy of the liver and has not begun treatment. EXAM: CHEST 1 VIEW COMPARISON:  Portable chest x-ray of June 22, 2016 FINDINGS: The lungs are less well inflated today. The interstitial markings remain increased. The cardiac silhouette is enlarged. The central pulmonary vascularity is prominent. There is calcification in the wall of the aortic arch. The bony thorax exhibits no acute abnormality. IMPRESSION: Mild pulmonary interstitial edema accentuated by mild hypoinflation. Mild cardiomegaly. No acute pneumonia. Thoracic aortic atherosclerosis. Electronically Signed   By: David  Martinique M.D.   On: 07/14/2016 09:51   US Paracentesis  Result Date: 07/14/2016 INDICATION: Abdominal distension, hepatitis-C, cirrhosis, hepatic mass EXAM: ULTRASOUND GUIDED RIGHT PARACENTESIS MEDICATIONS: 1%  LIDOCAINE LOCALLY COMPLICATIONS: None immediate. PROCEDURE: Informed written consent was obtained from the patient after a discussion of the risks, benefits and alternatives to treatment. A timeout  was performed prior to the initiation of the procedure. Initial ultrasound scanning demonstrates a large amount of ascites within the right lower abdominal quadrant. The right lower abdomen was prepped and draped in the usual sterile fashion. 1% lidocaine with epinephrine was used for local anesthesia. Following this, a 6 Fr Safe-T-Centesis catheter was introduced. An ultrasound image was saved for documentation purposes. The paracentesis was performed. The catheter was removed and a dressing was applied. The patient tolerated the procedure well without immediate post procedural complication. FINDINGS: A total of approximately 3.3 L of CLEAR PERITONEAL fluid was removed. Samples were sent to the laboratory as requested by the clinical team. IMPRESSION: Successful ultrasound-guided paracentesis yielding 3.3 liters of peritoneal fluid. Electronically Signed   By: Jerilynn Mages.  Shick M.D.   On: 07/14/2016 14:08   ASSESSMENT AND PLAN:   Donald Bowers  is a 61 y.o. male with a known history of recently diagnosed HCC, Hep C, Alcohol abuse, presents to the ED due to abdominal distention and SOB. He had liver biopsy and paracentesis on same day 07/09/2016. 2.2 liters fluid drained.  * Severe ascites likely secondary to hepatocellular carcinoma (biospy proven) and hypoalbuminemia S/p Ultrasound paracentesis 3.3 liters removed y'day -last one done on 07/09/16 2.3 liters  *Sepsis secondary to recurrent SBP  -Patient presented with diffuse abdominal tenderness with white blood cell count, fever of 101.2 tachycardia -Continue IV antibiotics with ceftriaxone   * Hypervolemic hyponatremia. We will order Lasix. With hepatocellular carcinoma and ascites he would likely have a chronic hyponatremia. Will need to see where patient sodium settles down by the time of discharge. -Patient seems is not taking his medications appropriately at home.  * Hepatocellular carcinoma. -  consult oncology Dr. Grayland Ormond patient was supposed  to see to discuss prognosis and treatment plan. -Appears patient is overall declining. -Palliative care consultation  * DVT prophylaxis with SCDs. Patient had recent GI bleed due to esophageal varices. He also has elevated INR.  Case discussed with Care Management/Social Worker. Management plans discussed with the patient, family and they are in agreement.  CODE STATUS: Full code for now  DVT Prophylaxis: SCD  TOTAL TIME TAKING CARE OF THIS PATIENT: 30 minutes.  >50% time spent on counselling and coordination of care  POSSIBLE D/C IN one to 2 DAYS, DEPENDING ON CLINICAL CONDITION.  Note: This dictation was prepared with Dragon dictation along with smaller phrase technology. Any transcriptional errors that result from this process are unintentional.  Saleena Tamas M.D on 07/15/2016 at 12:26 PM  Between 7am to 6pm - Pager - [phone]  After 6pm go to [email or web address] - password [email or web address] [facility]  Office  [phone]  CC: Primary care physician; Dion Body, MD

## 2016-07-15 NOTE — Progress Notes (Signed)
Initial Nutrition Assessment  DOCUMENTATION CODES:   Severe malnutrition in context of acute illness/injury  INTERVENTION:  Provide Ensure Enlive po TID, each supplement provides 350 kcal and 20 grams of protein. Patient prefers chocolate.  Recommend 2 gram sodium diet. Patient with no history of diabetes. Consider fluid restriction with hyponatremia.  Encouraged intake of foods that are easy to prepare at home. Discussed frozen options, sandwiches, and other items patient will enjoy.  NUTRITION DIAGNOSIS:   Malnutrition (Severe) related to acute illness (hepatocellular carcinoma, cirrhosis) as evidenced by 8.2 percent weight loss over 5 weeks, moderate depletion of body fat, severe depletion of body fat, moderate depletions of muscle mass, severe depletion of muscle mass.  GOAL:   Patient will meet greater than or equal to 90% of their needs  MONITOR:   PO intake, Supplement acceptance, Labs, Weight trends, I & O's  REASON FOR ASSESSMENT:   Malnutrition Screening Tool    ASSESSMENT:   61 year old male with PMHx of COPD, Hepatitis C, GI bleed likely secondary to varices, EtOH abuse, cocaine abuse, recently diagnosed hepatocellular carcinoma, admitted with severe ascites secondary to [facility] and hypoalbuminemia, sepsis.   -Patient s/p US guided paracentesis and liver biopsy on 5/9 (2.2 L fluid removed). -On 5/14 had another US guided paracentesis where 3.3 L fluid removed -PMT has been consulted to meet with patient.  Spoke with patient at bedside. He is known to this RD from several previous admissions. Patient reports he is no longer working (his previous third shift schedule made eating difficult). He reports his appetite has remained poor over the past month. He can no longer eat typical meals. Reports he tries to eat small snacks but he gets nauseous and cannot eat them. He has not purchased any oral nutrition supplements to drink at home yet. Reports his son lives with him but  is unable to help him prepare foods because he is gone to work all day long. Patient also endorses discomfort with abdominal distention affecting his ability to eat.  Per chart patient had 0% of dinner last night. Reports he finished 100% of breakfast this morning (546 kcal, 20 grams of protein), but not documented in chart.  UBW was 205 lbs. Patient had previously lost 17 lbs (8.2% body weight) over 5 weeks, which is significant for time frame. Current weight likely inaccurate as patient's intake has continued to decline and would expect further weight loss. May be falsely elevated due to ascites.  Medications reviewed and include: Lasix 40 mg daily, pantoprazole, ceftriaxone, morphine PRN.  Labs reviewed: Sodium 121, Chloride 92.   Nutrition-Focused physical exam completed. Findings are moderate-severe fat depletion, moderate-severe muscle depletion, and moderate edema. Noted worsening of fat and muscle depletion compared to previous exam. Abdomen very distended.  Discussed with RN.  Diet Order:  Diet Carb Modified Fluid consistency: Thin; Room service appropriate? Yes  Skin:  Reviewed, no issues  Last BM:  07/15/2016 - type 6 per chart  Height:   Ht Readings from Last 1 Encounters:  07/14/16 6' (1.829 m)    Weight:   Wt Readings from Last 1 Encounters:  07/14/16 190 lb (86.2 kg)    Ideal Body Weight:  80.9 kg  BMI:  Body mass index is 25.77 kg/m.  Estimated Nutritional Needs:   Kcal:  4132-4401 (MSJ x 1.2-1.4)  Protein:  110-130 grams (1.3-1.5 grams/kg)  Fluid:  1-1.2 L/day with serum sodium <125  EDUCATION NEEDS:   No education needs identified at this time  Willey Blade, MS, RD, LDN Pager: [phone] After Hours Pager: [phone]

## 2016-07-16 DIAGNOSIS — Z7189 Other specified counseling: Secondary | ICD-10-CM

## 2016-07-16 DIAGNOSIS — Z515 Encounter for palliative care: Secondary | ICD-10-CM

## 2016-07-16 DIAGNOSIS — K652 Spontaneous bacterial peritonitis: Secondary | ICD-10-CM

## 2016-07-16 DIAGNOSIS — C220 Liver cell carcinoma: Secondary | ICD-10-CM

## 2016-07-16 MED ORDER — MORPHINE SULFATE (PF) 2 MG/ML IV SOLN
2.0000 mg | INTRAVENOUS | Status: DC | PRN
  Administered 2016-07-16 – 2016-07-18 (×7): 2 mg via INTRAVENOUS
  Filled 2016-07-16 (×7): qty 1

## 2016-07-16 MED ORDER — SENNOSIDES-DOCUSATE SODIUM 8.6-50 MG PO TABS
1.0000 | ORAL_TABLET | Freq: Every day | ORAL | Status: DC
  Administered 2016-07-16 – 2016-07-20 (×5): 1 via ORAL
  Filled 2016-07-16 (×5): qty 1

## 2016-07-16 MED ORDER — FUROSEMIDE 10 MG/ML IJ SOLN
20.0000 mg | Freq: Three times a day (TID) | INTRAMUSCULAR | Status: DC
  Administered 2016-07-16 – 2016-07-17 (×5): 20 mg via INTRAVENOUS
  Filled 2016-07-16 (×6): qty 2

## 2016-07-16 NOTE — Care Management (Signed)
Admitted to [facility] with the diagnosis of ascitis fluid. Son Harvie lives with him. [phone]).  Last seen Dr. Netty Starring 05/29/16. Prescriptions are filled at [facility] on [address]. No home health. No skilled facility. No home oxygen. No equipment in the home. Takes care of all basic activities of daily living himself, can drive, but doesn't. Decreased appetite. No falls. Family will transport. Mr. Linck has hepatocellular cancer. Last paracentesis was Wednesday of last week. 3.3 liters removed. Goes to the [facility], No radiation or chemotherapy.  Palliative care consult in progress.  Shelbie Ammons RN MSN CCM Care Management [phone]

## 2016-07-16 NOTE — Progress Notes (Signed)
[place] at [place] NAME: Donald Bowers    MR#:  [id number]  DATE OF BIRTH:  [birth year]  Came in with increasing weakness and abdominal distention and shortness of breath Patient maybe to ambulate by himself. REVIEW OF SYSTEMS:   Review of Systems  Constitutional: Negative for chills, fever and weight loss.  HENT: Negative for ear discharge, ear pain and nosebleeds.   Eyes: Negative for blurred vision, pain and discharge.  Respiratory: Positive for shortness of breath. Negative for sputum production, wheezing and stridor.   Cardiovascular: Positive for orthopnea. Negative for chest pain, palpitations and PND.  Gastrointestinal: Positive for abdominal pain. Negative for diarrhea, nausea and vomiting.  Genitourinary: Negative for frequency and urgency.  Musculoskeletal: Negative for back pain and joint pain.  Neurological: Positive for weakness. Negative for sensory change, speech change and focal weakness.  Psychiatric/Behavioral: Negative for depression and hallucinations. The patient is not nervous/anxious.    Tolerating Diet:yes Tolerating PT: ambulatory  DRUG ALLERGIES:  No Known Allergies  VITALS:  Blood pressure (!) 113/58, pulse (!) 109, temperature 99.5 F (37.5 C), temperature source Oral, resp. rate 18, height 6' (1.829 m), weight 78.7 kg (173 lb 6.4 oz), SpO2 94 %.  PHYSICAL EXAMINATION:   Physical Exam  GENERAL:  61 y.o.-year-old patient lying in the bed with no acute distress. Chronically ill  EYES: Pupils equal, round, reactive to light and accommodation.+ scleral icterus. Extraocular muscles intact.  HEENT: Head atraumatic, normocephalic. Oropharynx and nasopharynx clear.  oral mucosa dry  NECK:  Supple, no jugular venous distention. No thyroid enlargement, no tenderness.  LUNGS: Normal breath sounds bilaterally, no wheezing, rales, rhonchi. No use of accessory muscles of respiration.  CARDIOVASCULAR: S1, S2  normal. No murmurs, rubs, or gallops.  ABDOMEN: Soft, nontender, severely distended. Bowel sounds present. No organomegaly or mass. Ascites++  EXTREMITIES: No cyanosis, clubbing or edema b/l.    NEUROLOGIC: Cranial nerves II through XII are intact. No focal Motor or sensory deficits b/l.   PSYCHIATRIC:  patient is alert and oriented x 3.  SKIN: No obvious rash, lesion, or ulcer.   LABORATORY PANEL:  CBC  Recent Labs Lab 07/15/16 0557  WBC 17.1*  HGB 8.5*  HCT 26.6*  PLT 228    Chemistries   Recent Labs Lab 07/14/16 0924 07/15/16 0557  NA 121* 121*  K 4.3 4.4  CL 92* 92*  CO2 24 23  GLUCOSE 150* 148*  BUN 16 18  CREATININE 0.96 0.88  CALCIUM 7.5* 7.1*  MG 1.9  --   AST 119* 85*  ALT 25 21  ALKPHOS 121 94  BILITOT 5.8* 4.5*   Cardiac Enzymes  Recent Labs Lab 07/14/16 0924  TROPONINI <0.03   RADIOLOGY:  US Paracentesis  Result Date: 07/14/2016 INDICATION: Abdominal distension, hepatitis-C, cirrhosis, hepatic mass EXAM: ULTRASOUND GUIDED RIGHT PARACENTESIS MEDICATIONS: 1% LIDOCAINE LOCALLY COMPLICATIONS: None immediate. PROCEDURE: Informed written consent was obtained from the patient after a discussion of the risks, benefits and alternatives to treatment. A timeout was performed prior to the initiation of the procedure. Initial ultrasound scanning demonstrates a large amount of ascites within the right lower abdominal quadrant. The right lower abdomen was prepped and draped in the usual sterile fashion. 1% lidocaine with epinephrine was used for local anesthesia. Following this, a 6 Fr Safe-T-Centesis catheter was introduced. An ultrasound image was saved for documentation purposes. The paracentesis was performed. The catheter was removed and a dressing was applied. The patient tolerated the  procedure well without immediate post procedural complication. FINDINGS: A total of approximately 3.3 L of CLEAR PERITONEAL fluid was removed. Samples were sent to the laboratory as  requested by the clinical team. IMPRESSION: Successful ultrasound-guided paracentesis yielding 3.3 liters of peritoneal fluid. Electronically Signed   By: Jerilynn Mages.  Shick M.D.   On: 07/14/2016 14:08   ASSESSMENT AND PLAN:   Donald Bowers  is a 61 y.o. male with a known history of recently diagnosed HCC, Hep C, Alcohol abuse, presents to the ED due to abdominal distention and SOB. He had liver biopsy and paracentesis on same day 07/09/2016. 2.2 liters fluid drained.  * Severe ascites likely secondary to hepatocellular carcinoma (biospy proven) and hypoalbuminemia S/p Ultrasound paracentesis 3.3 liters removed on 07/14/16 -last one done on 07/09/16 2.3 liters -patient will need paracentesis again today  *Sepsis secondary to recurrent SBP  -Patient presented with diffuse abdominal tenderness with white blood cell count, fever of 101.2 tachycardia -Continue IV antibiotics with ceftriaxone   * Hypervolemic hyponatremia.  -We will order IVLasix. With hepatocellular carcinoma and ascites he would likely have a chronic hyponatremia. -Patient seems is not taking his medications appropriately at home.  * Hepatocellular carcinoma. -  consult oncology Dr. Grayland Ormond patient was supposed to see to discuss prognosis and treatment plan. -Appears patient is overall declining. -Palliative care consultation Family meeting at 10:30  * DVT prophylaxis with SCDs. Patient had recent GI bleed due to esophageal varices. He also has elevated INR.  Case discussed with Care Management/Social Worker. Management plans discussed with the patient, family and they are in agreement.  CODE STATUS: Full code for now  DVT Prophylaxis: SCD  TOTAL TIME TAKING CARE OF THIS PATIENT: 30 minutes.  >50% time spent on counselling and coordination of care  POSSIBLE D/C IN one to 2 DAYS, DEPENDING ON CLINICAL CONDITION.  Note: This dictation was prepared with Dragon dictation along with smaller phrase technology. Any  transcriptional errors that result from this process are unintentional.  Jakerria Kingbird M.D on 07/16/2016 at 10:28 AM  Between 7am to 6pm - Pager - [phone]  After 6pm go to [email or web address] - password [email or web address] [place] [facility]  Office  [phone]  CC: Primary care physician; Dion Body, MD

## 2016-07-17 ENCOUNTER — Inpatient Hospital Stay: Payer: 59

## 2016-07-17 DIAGNOSIS — K7031 Alcoholic cirrhosis of liver with ascites: Secondary | ICD-10-CM

## 2016-07-17 LAB — BASIC METABOLIC PANEL
Anion gap: 4 — ABNORMAL LOW (ref 5–15)
BUN: 26 mg/dL — ABNORMAL HIGH (ref 6–20)
CHLORIDE: 93 mmol/L — AB (ref 101–111)
CO2: 25 mmol/L (ref 22–32)
Calcium: 7.6 mg/dL — ABNORMAL LOW (ref 8.9–10.3)
Creatinine, Ser: 1.06 mg/dL (ref 0.61–1.24)
GFR calc non Af Amer: >60 mL/min (ref >60)
Glucose, Bld: 114 mg/dL — ABNORMAL HIGH (ref 65–99)
POTASSIUM: 5 mmol/L (ref 3.5–5.1)
SODIUM: 122 mmol/L — AB (ref 135–145)

## 2016-07-17 MED ORDER — METOCLOPRAMIDE HCL 10 MG PO TABS
10.0000 mg | ORAL_TABLET | Freq: Four times a day (QID) | ORAL | Status: DC
  Administered 2016-07-17 – 2016-07-21 (×16): 10 mg via ORAL
  Filled 2016-07-17 (×18): qty 1

## 2016-07-17 NOTE — Progress Notes (Signed)
Daily Progress Note   Patient Name: Donald Bowers       Date: 07/18/16 DOB: [birth year]  Age: 61 y.o. MRN#: [id number] Attending Physician: Fritzi Mandes, MD Primary Care Physician: Dion Body, MD Admit Date: 07/14/2016  Reason for Consultation/Follow-up: Establishing goals of care  Subjective: Patient sitting up in chair this afternoon. Alert, oriented, able to participate in the conversation but drowsy. Tells me he is seeing ants on the floor but "was hallucinating before coming to the hospital." Complains of intermittent abdominal pain.   Sister, Donald Bowers, at bedside. Son, Donald Bowers, having transportation issues and unable to be at the [employer] speaker phone.   We discussed diagnoses, guarded prognosis, interventions, disposition options, and advanced directives. Patient and family understand he is not a candidate for chemotherapy at this time. Son remains hopeful that "once we get the fluid off, he will be able to eat and get stronger for chemotherapy." Explained my concern that he may continue to decline and I would not be surprised if he never reached a point to tolerate chemo. Family also questions liver transplant--I explained reasons why he is not a candidate for this option.   Sister asks what the focus should be on. Educated on comfort/symptom management focus. Educated on hospice services and options. Educated on code status and my recommendation for DNR/DNI with underlying cancer. Patient becomes very tearful during the conversation. He states "I had it all and now I have nothing." He speaks of his history of alcohol abuse leading to his current situation. He is afraid of dying and "wants to live longer." He regrets his past but "can't change it." Provided emotional support--not  able to change the past but we can focus on things that are important to him now with the time he has left.   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 "Can fix anything you put in front of him." She is worried about him managing pain medication and being home majority of the day by himself.   Answered questions and concerns. [employer] copy left for review.   Length of Stay: 3  Current Medications: Scheduled Meds:  . feeding supplement (ENSURE ENLIVE)  237 mL Oral TID BM  . furosemide  20 mg Intravenous Q8H  . pantoprazole  40 mg Oral Daily  . senna-docusate  1 tablet Oral QHS  . sodium chloride flush  3 mL Intravenous Q12H    Continuous Infusions: . cefTRIAXone (ROCEPHIN)  IV Stopped (07/16/16 1510)    PRN Meds: acetaminophen **OR** acetaminophen, albuterol, morphine injection, ondansetron **OR** ondansetron (ZOFRAN) IV, oxyCODONE, polyethylene glycol, traZODone  Physical Exam  Constitutional: He is oriented to person, place, and time. He is cooperative. He appears ill.  HENT:  Head: Normocephalic and atraumatic.  Cardiovascular: Regular rhythm.   Pulmonary/Chest: Effort normal. He has decreased breath sounds.  Abdominal: He exhibits distension and ascites. There is tenderness.  Musculoskeletal: He exhibits edema (generalized).  Neurological: He is alert and oriented to person, place, and time.  drowsy  Skin: Skin is warm and dry.  Psychiatric: His speech is delayed. He is withdrawn. He exhibits a depressed mood.  Nursing note and vitals reviewed.          Vital Signs: BP 115/65 (BP Location: Left Arm)   Pulse 100   Temp 98.6 F (37 C) (Oral)   Resp 19   Ht 6' (1.829 m)   Wt 78.4 kg (172 lb 14.4 oz)   SpO2 95%   BMI 23.45 kg/m  SpO2: SpO2: 95 % O2 Device: O2 Device: Not Delivered O2 Flow Rate: O2 Flow Rate (L/min): 2 L/min  Intake/output summary:   Intake/Output Summary (Last 24 hours) at 07/17/16 0934 Last data filed at 07/17/16  0021  Gross per 24 hour  Intake              123 ml  Output              400 ml  Net             -277 ml   LBM: Last BM Date: 07/15/16 Baseline Weight: Weight: 86.2 kg (190 lb) Most recent weight: Weight: 78.4 kg (172 lb 14.4 oz)   Palliative Assessment/Data: PPS 50%   Flowsheet Rows     Most Recent Value  Intake Tab  Referral Department  Hospitalist  Unit at Time of Referral  Oncology Unit  Palliative Care Primary Diagnosis  Cancer  Date Notified  07/15/16  Palliative Care Type  New Palliative care  Reason for referral  Clarify Goals of Care  Date of Admission  07/14/16  Date first seen by Palliative Care  07/15/16  # of days IP prior to Palliative referral  1  Clinical Assessment  Palliative Performance Scale Score  50%  Psychosocial & Spiritual Assessment  Palliative Care Outcomes  Patient/Family meeting held?  Yes  Who was at the meeting?  patient, son, sister  Palliative Care Outcomes  Clarified goals of care, Counseled regarding hospice, Linked to palliative care logitudinal support, Provided end of life care assistance, ACP counseling assistance, Provided psychosocial or spiritual support      Patient Active Problem List   Diagnosis Date Noted  . Hepatocellular carcinoma ([place])   . Palliative care by specialist   . Goals of care, counseling/discussion   . SBP (spontaneous bacterial peritonitis) ([place])   . Ascitic fluid 07/14/2016  . Liver mass   . Hematemesis 06/19/2016  . Melena 06/19/2016  . COPD (chronic obstructive pulmonary disease) ([place]) 06/19/2016  . Hepatitis C 06/19/2016  . Hyponatremia 05/19/2016    Palliative Care Assessment & Plan   Patient Profile: 61 y.o. male  with past medical history of hepatitis C, ETOH and cocaine abuse, GI bleed, COPD, pneumonia, and arthritis admitted on 07/14/2016 with shortness of breath and abdominal distention. Followed by Dr.  Finnegan. Recently diagnosed with hepatocellular carcinoma. AFP >17,000. Paracentesis on 5/9  with 2.2 L removed. In ED, patient with severe ascites and underwent paracentesis with 3.3 L removed. Also with sepsis secondary to recurrent SBP and receiving ceftriaxone. Hypervolemic hyponatremia and receiving IV lasix. Palliative medicine consultation for goals of care.   Assessment: Hepatocellular carcinoma Severe ascites Sepsis Recurrent SBP Hypervolemic hyponatremia  Recommendations/Plan:  FULL code. Educated on recommendation for DNR/DNI with underlying cancer. Education materials provided to sister.   Watchful waiting. Possible perc drain placement?   Patient family considering options. Not ready for hospice services. Will need palliative services at discharge.   PMT will f/u tomorrow afternoon, 5/18.  Goals of Care and Additional Recommendations:  Limitations on Scope of Treatment: Full Scope Treatment  Code Status: FULL   Code Status Orders        Start     Ordered   07/14/16 1112  Full code  Continuous     07/14/16 1113    Code Status History    Date Active Date Inactive Code Status Order ID Comments User Context   06/20/2016  2:55 AM 06/24/2016  3:03 PM Full Code [id number]  Lance Coon, MD Inpatient   05/19/2016  9:16 PM 05/22/2016  3:03 PM Full Code [id number]  Fritzi Mandes, MD Inpatient       Prognosis:   Unable to determine: guarded with hepatocellular carcinoma, ascites, recurrent SBP, and functional and nutritional status decline.   Discharge Planning:  To Be Determined  Care plan was discussed with patient, son, sister, RN, RN CM, Dr. Posey Pronto, and Dr. Grayland Ormond  Thank you for allowing the Palliative Medicine Team to assist in the care of this patient.   Time In: 1200 Time Out: 1320 Total Time 39min Prolonged Time Billed yes      Greater than 50%  of this time was spent counseling and coordinating care related to the above assessment and plan.  Ihor Dow, FNP-C Palliative Medicine Team  Phone: [phone] Fax: [phone]  Please contact  Palliative Medicine Team phone at [phone] for questions and concerns.

## 2016-07-17 NOTE — Care Management Note (Signed)
Case Management Note  Patient Details  Name: Donald Bowers MRN: [id number] Date of Birth: [birth year]  Subjective/Objective:                  RNCM spoke with Donald Bowers with palliative team regarding hospice, private duty care, and home health services. Patient and son Donald Bowers are really struggling with decisions however at present they want everything done in hopes that patient will eventually be able to start chemotherapy. I spoke with patient's son Donald Bowers . He works from [employer]. He is struggling with how to take care of his father. Patient wants to go home where he is alone part of the day. Son wants him to have a caregiver however neither one can afford private duty. Siblings may be able to help care for patient while son is at work. Son anticipates a drain tube to be placed prior to discharge and return to home. They hope that keeping the fluid off patient will be able to improve nutritions and thus start chemo. He is not on O2 at home. He usually walks independently at home. He has no DME for ambulation. He does not have a hospital bed. Son does not feel he needs these things. Son states that he is not certain patient can make health care decisions because of medications he is receiving to control pain. He mentioned HCPOA however I explained that patient must have capacity and not be intoxicated or altered. Patient has two sons. Donald Bowers lives with patient. Younger brother's name is Donald Bowers. Donald Bowers would like to have [facility] as he is declining hospice services at this time. Donald Bowers will be applying patient for Medicaid.   Action/Plan: Home health agency list provided to [facility] and copy left at patient's bedside. Per son patient has agreed to "whatever is needed to give him a fighting chance".  Referral to Donald Bowers with [facility] for review. RNCM will continue to follow.   Expected Discharge Date:                  Expected Discharge Plan:     In-House Referral:      Discharge planning Services  CM Consult  Post Acute Care Choice:  Home Health, Durable Medical Equipment Choice offered to:  Adult Children  DME Arranged:    DME Agency:     HH Arranged:  RN, PT, Social Work [employer] Agency:     Status of Service:  In process, will continue to follow  If discussed at Long Length of Stay Meetings, dates discussed:    Additional Comments:  Donald Garfinkel, RN 07/17/2016, 2:59 PM

## 2016-07-17 NOTE — Progress Notes (Signed)
[place] [facility] [place] NAME: Donald Bowers    MR#:  [id number]  DATE OF BIRTH:  [birth year]  Came in with increasing weakness and abdominal distention and shortness of breath Patient maybe to ambulate by himself. REVIEW OF SYSTEMS:   Review of Systems  Constitutional: Negative for chills, fever and weight loss.  HENT: Negative for ear discharge, ear pain and nosebleeds.   Eyes: Negative for blurred vision, pain and discharge.  Respiratory: Positive for shortness of breath. Negative for sputum production, wheezing and stridor.   Cardiovascular: Positive for orthopnea. Negative for chest pain, palpitations and PND.  Gastrointestinal: Positive for abdominal pain. Negative for diarrhea, nausea and vomiting.  Genitourinary: Negative for frequency and urgency.  Musculoskeletal: Negative for back pain and joint pain.  Neurological: Positive for weakness. Negative for sensory change, speech change and focal weakness.  Psychiatric/Behavioral: Negative for depression and hallucinations. The patient is not nervous/anxious.    Tolerating Diet:yes Tolerating PT: ambulatory  DRUG ALLERGIES:  No Known Allergies  VITALS:  Blood pressure 113/63, pulse (!) 110, temperature 98.6 F (37 C), resp. rate 20, height 6' (1.829 m), weight 78.4 kg (172 lb 14.4 oz), SpO2 97 %.  PHYSICAL EXAMINATION:   Physical Exam  GENERAL:  61 y.o.-year-old patient lying in the bed with no acute distress. Chronically ill  EYES: Pupils equal, round, reactive to light and accommodation.+ scleral icterus. Extraocular muscles intact.  HEENT: Head atraumatic, normocephalic. Oropharynx and nasopharynx clear.  oral mucosa dry  NECK:  Supple, no jugular venous distention. No thyroid enlargement, no tenderness.  LUNGS: Normal breath sounds bilaterally, no wheezing, rales, rhonchi. No use of accessory muscles of respiration.  CARDIOVASCULAR: S1, S2 normal. No murmurs, rubs, or  gallops.  ABDOMEN: Soft, nontender, severely distended. Bowel sounds present. No organomegaly or mass. Ascites++  EXTREMITIES: No cyanosis, clubbing or edema b/l.    NEUROLOGIC: Cranial nerves II through XII are intact. No focal Motor or sensory deficits b/l.   PSYCHIATRIC:  patient is alert and oriented x 3.  SKIN: No obvious rash, lesion, or ulcer.   LABORATORY PANEL:  CBC  Recent Labs Lab 07/15/16 0557  WBC 17.1*  HGB 8.5*  HCT 26.6*  PLT 228    Chemistries   Recent Labs Lab 07/14/16 0924 07/15/16 0557 07/17/16 0440  NA 121* 121* 122*  K 4.3 4.4 5.0  CL 92* 92* 93*  CO2 24 23 25   GLUCOSE 150* 148* 114*  BUN 16 18 26*  CREATININE 0.96 0.88 1.06  CALCIUM 7.5* 7.1* 7.6*  MG 1.9  --   --   AST 119* 85*  --   ALT 25 21  --   ALKPHOS 121 94  --   BILITOT 5.8* 4.5*  --    Cardiac Enzymes  Recent Labs Lab 07/14/16 0924  TROPONINI <0.03   RADIOLOGY:  No results found. ASSESSMENT AND PLAN:   Donald Bowers  is a 61 y.o. male with a known history of recently diagnosed HCC, Hep C, Alcohol abuse, presents to the ED due to abdominal distention and SOB. He had liver biopsy and paracentesis on same day 07/09/2016. 2.2 liters fluid drained.  * Severe ascites likely secondary to hepatocellular carcinoma (biospy proven) and hypoalbuminemia S/p Ultrasound paracentesis 3.3 liters removed on 07/14/16 -last one done on 07/09/16 2.3 liters -According to interventional radiology patient does not have much fluid for draining. They can assess patient for Pleurx catheter placement as outpatient.  *Sepsis secondary to  recurrent SBP  -Patient presented with diffuse abdominal tenderness with white blood cell count, fever of 101.2 tachycardia -Continue IV antibiotics with ceftriaxone --- will change to oral antibiotics tomorrow  * Hypervolemic hyponatremia.  -We will order IVLasix.--- Changed to oral tomorrow With hepatocellular carcinoma and ascites he would likely have a chronic  hyponatremia. -Patient seems is not taking his medications appropriately at home.  * Hepatocellular carcinoma. -  consult oncology Dr. Grayland Ormond patient was supposed to see to discuss prognosis and treatment plan. -Appears patient is overall declining. -Palliative care consultation appreciated. Patient at this point in time is eligible for palliative care at home.  * DVT prophylaxis with SCDs. Patient had recent GI bleed due to esophageal varices. He also has elevated INR.  Case discussed with Care Management/Social Worker. Management plans discussed with the patient, family and they are in agreement.  CODE STATUS: Full code for now  DVT Prophylaxis: SCD  TOTAL TIME TAKING CARE OF THIS PATIENT: 25 minutes.  >50% time spent on counselling and coordination of care  POSSIBLE D/C IN one to 2 DAYS, DEPENDING ON CLINICAL CONDITION.  Note: This dictation was prepared with Dragon dictation along with smaller phrase technology. Any transcriptional errors that result from this process are unintentional.  Haygen Zebrowski M.D on 07/17/2016 at 3:00 PM  Between 7am to 6pm - Pager - [phone]  After 6pm go to [email or web address] - password [email or web address] [facility]  Office  [phone]  CC: Primary care physician; Dion Body, MD

## 2016-07-17 NOTE — Progress Notes (Signed)
New referral for [facility] home health services of nursing and social work received from [employer]. Writer to engage with patient and his son on 5/18. Patient information faxed to referral. No current DME needs per chart note review. Thank you. Flo Shanks RN, BSN, [facility] Liaison [phone]

## 2016-07-17 NOTE — Progress Notes (Signed)
Nutrition Follow-up  DOCUMENTATION CODES:   Severe malnutrition in context of acute illness/injury  INTERVENTION:  Continue Ensure Enlive po TID, each supplement provides 350 kcal and 20 grams of protein.   Also recommend [employer] Breakfast in whole milk po TID with meals, each supplement provides 280 kcal and 13 grams of protein.  Will continue to monitor outcome of discussions regarding goals of care.  NUTRITION DIAGNOSIS:   Malnutrition (Severe) related to acute illness (hepatocellular carcinoma, cirrhosis) as evidenced by percent weight loss, moderate depletion of body fat, severe depletion of body fat, moderate depletions of muscle mass, severe depletion of muscle mass.  [place].  GOAL:   Patient will meet greater than or equal to 90% of their needs  Not met - addressing with oral nutrition supplements.  MONITOR:   PO intake, Supplement acceptance, Labs, Weight trends, I & O's  REASON FOR ASSESSMENT:   Malnutrition Screening Tool    ASSESSMENT:   61 year old male with PMHx of COPD, Hepatitis C, GI bleed likely secondary to varices, EtOH abuse, cocaine abuse, recently diagnosed hepatocellular carcinoma, admitted with severe ascites secondary to [facility] and hypoalbuminemia, sepsis.  -PMT following patient. Per chart patient and son having meeting today to discuss [place] with PMT.  Spoke with patient. He was sitting in chair. He reports his appetite remains very poor. He tries to eat meals but cannot. Enjoys Ensure and reports he is drinking all three bottles per day.   Meal Completion: 10% of breakfast yesterday, 15% of lunch yesterday, 0% of dinner yesterday per chart In the past 24 hours patient has had approximately 1158 kcal (56% minimum estimated kcal needs) and 64 grams of protein (58% minimum estimated protein needs) including Ensure.  Medications reviewed and include: Lasix 20 mg Q8hrs, Reglan, pantoprazole, senna, ceftriaxone, morphine PRN.  Labs reviewed:  Sodium 122, Chloride 93, BUN 26.   Discussed with RN. Patient wasn't able to get paracentesis yesterday.   Diet Order:  Diet 2 gram sodium Room service appropriate? Yes; Fluid consistency: Thin  Skin:  Reviewed, no issues  Last BM:  07/16/2016 - type 5 per chart  Height:   Ht Readings from Last 1 Encounters:  07/14/16 6' (1.829 m)    Weight:   Wt Readings from Last 1 Encounters:  07/17/16 172 lb 14.4 oz (78.4 kg)    Ideal Body Weight:  80.9 kg  BMI:  Body mass index is 23.45 kg/m.  Estimated Nutritional Needs:   Kcal:  4174-0814 (MSJ x 1.2-1.4)  Protein:  110-130 grams (1.3-1.5 grams/kg)  Fluid:  1-1.2 L/day with serum sodium <125  EDUCATION NEEDS:   No education needs identified at this time  Willey Blade, MS, RD, LDN Pager: [phone] After Hours Pager: [phone]

## 2016-07-18 DIAGNOSIS — K7031 Alcoholic cirrhosis of liver with ascites: Secondary | ICD-10-CM

## 2016-07-18 LAB — BODY FLUID CULTURE: Culture: NO GROWTH

## 2016-07-18 MED ORDER — FUROSEMIDE 40 MG PO TABS
40.0000 mg | ORAL_TABLET | Freq: Two times a day (BID) | ORAL | Status: DC
  Administered 2016-07-18: 40 mg via ORAL
  Filled 2016-07-18: qty 1

## 2016-07-18 MED ORDER — OXYCODONE HCL 5 MG PO TABS
5.0000 mg | ORAL_TABLET | ORAL | Status: DC | PRN
  Administered 2016-07-18 – 2016-07-20 (×5): 5 mg via ORAL
  Filled 2016-07-18 (×5): qty 1

## 2016-07-18 MED ORDER — FUROSEMIDE 10 MG/ML IJ SOLN
10.0000 mg/h | INTRAVENOUS | Status: DC
  Administered 2016-07-18: 5 mg/h via INTRAVENOUS
  Administered 2016-07-20: 8 mg/h via INTRAVENOUS
  Filled 2016-07-18 (×3): qty 25

## 2016-07-18 MED ORDER — TRAMADOL HCL 50 MG PO TABS: 50.0000 mg | ORAL_TABLET | Freq: Four times a day (QID) | ORAL | Status: DC | PRN

## 2016-07-18 MED ORDER — SPIRONOLACTONE 25 MG PO TABS: 25.0000 mg | ORAL_TABLET | Freq: Every day | ORAL | 1 refills | Status: DC

## 2016-07-18 MED ORDER — CIPROFLOXACIN HCL 500 MG PO TABS: ORAL_TABLET | ORAL | 1 refills | Status: DC

## 2016-07-18 MED ORDER — ENSURE ENLIVE PO LIQD: 237.0000 mL | Freq: Three times a day (TID) | ORAL | 12 refills | Status: DC

## 2016-07-18 MED ORDER — ALBUMIN HUMAN 25 % IV SOLN
25.0000 g | Freq: Three times a day (TID) | INTRAVENOUS | Status: DC
  Administered 2016-07-18 – 2016-07-21 (×9): 25 g via INTRAVENOUS
  Filled 2016-07-18 (×11): qty 100

## 2016-07-18 MED ORDER — MORPHINE SULFATE (PF) 2 MG/ML IV SOLN
2.0000 mg | Freq: Four times a day (QID) | INTRAVENOUS | Status: DC | PRN
  Administered 2016-07-18 – 2016-07-20 (×5): 2 mg via INTRAVENOUS
  Filled 2016-07-18 (×5): qty 1

## 2016-07-18 MED ORDER — SPIRONOLACTONE 25 MG PO TABS
25.0000 mg | ORAL_TABLET | Freq: Every day | ORAL | Status: DC
  Administered 2016-07-18 – 2016-07-21 (×4): 25 mg via ORAL
  Filled 2016-07-18 (×5): qty 1

## 2016-07-18 MED ORDER — CIPROFLOXACIN HCL 500 MG PO TABS
500.0000 mg | ORAL_TABLET | Freq: Two times a day (BID) | ORAL | Status: DC
  Administered 2016-07-18 – 2016-07-21 (×7): 500 mg via ORAL
  Filled 2016-07-18 (×7): qty 1

## 2016-07-18 MED ORDER — OXYCODONE HCL 5 MG PO TABA: 5.0000 mg | ORAL_TABLET | Freq: Two times a day (BID) | ORAL | 0 refills | Status: DC | PRN

## 2016-07-18 NOTE — Clinical Social Work Note (Signed)
CSW met with pt and family to address consult, per family request. CSW introduced herself and explained role of social work. CSW also explained the process of discharging to SNF. Pt is not appropriate for SNF as pt ambulates independently. CSW address other options such as home health (which will be provided by [facility]) and care givers (paid and family members). Pt's sister did inquire about Medicaid and information will be provided. CSW updated RNCM. The plan remains for pt to discharge home with home health services. CSW is signing off as no further needs identified.   Darden Dates, MSW, LCSW  Clinical Social Worker [phone]

## 2016-07-18 NOTE — Progress Notes (Signed)
[facility] [place] NAME: Donald Bowers    MR#:  [id number]  DATE OF BIRTH:  06-07-1955  Came in with increasing weakness and abdominal distention and shortness of breath Patient able to ambulate by himself. Severe leg edema REVIEW OF SYSTEMS:   Review of Systems  Constitutional: Negative for chills, fever and weight loss.  HENT: Negative for ear discharge, ear pain and nosebleeds.   Eyes: Negative for blurred vision, pain and discharge.  Respiratory: Positive for shortness of breath. Negative for sputum production, wheezing and stridor.   Cardiovascular: Positive for orthopnea. Negative for chest pain, palpitations and PND.  Gastrointestinal: Positive for abdominal pain. Negative for diarrhea, nausea and vomiting.  Genitourinary: Negative for frequency and urgency.  Musculoskeletal: Negative for back pain and joint pain.  Neurological: Positive for weakness. Negative for sensory change, speech change and focal weakness.  Psychiatric/Behavioral: Negative for depression and hallucinations. The patient is not nervous/anxious.    Tolerating Diet:yes Tolerating PT: ambulatory  DRUG ALLERGIES:  No Known Allergies  VITALS:  Blood pressure (!) 100/53, pulse (!) 103, temperature 98.2 F (36.8 C), temperature source Oral, resp. rate 20, height 6' (1.829 m), weight 94.3 kg (207 lb 12.8 oz), SpO2 97 %.  PHYSICAL EXAMINATION:   Physical Exam  GENERAL:  61 y.o.-year-old patient lying in the bed with no acute distress. Chronically ill anasarca++ EYES: Pupils equal, round, reactive to light and accommodation.+ scleral icterus. Extraocular muscles intact.  HEENT: Head atraumatic, normocephalic. Oropharynx and nasopharynx clear.  oral mucosa dry  NECK:  Supple, no jugular venous distention. No thyroid enlargement, no tenderness.  LUNGS: Normal breath sounds bilaterally, no wheezing, rales, rhonchi. No use of accessory muscles of  respiration.  CARDIOVASCULAR: S1, S2 normal. No murmurs, rubs, or gallops.  ABDOMEN: Soft, nontender, severely distended. Bowel sounds present. No organomegaly or mass. Ascites++  EXTREMITIES: No cyanosis, clubbing  Edema+++    NEUROLOGIC: Cranial nerves II through XII are intact. No focal Motor or sensory deficits b/l.   PSYCHIATRIC:  patient is alert and oriented x 3.  SKIN: No obvious rash, lesion, or ulcer.   LABORATORY PANEL:  CBC  Recent Labs Lab 07/15/16 0557  WBC 17.1*  HGB 8.5*  HCT 26.6*  PLT 228    Chemistries   Recent Labs Lab 07/14/16 0924 07/15/16 0557 07/17/16 0440  NA 121* 121* 122*  K 4.3 4.4 5.0  CL 92* 92* 93*  CO2 24 23 25   GLUCOSE 150* 148* 114*  BUN 16 18 26*  CREATININE 0.96 0.88 1.06  CALCIUM 7.5* 7.1* 7.6*  MG 1.9  --   --   AST 119* 85*  --   ALT 25 21  --   ALKPHOS 121 94  --   BILITOT 5.8* 4.5*  --    Cardiac Enzymes  Recent Labs Lab 07/14/16 0924  TROPONINI <0.03   RADIOLOGY:  No results found. ASSESSMENT AND PLAN:   Donald Bowers  is a 61 y.o. male with a known history of recently diagnosed HCC, Hep C, Alcohol abuse, presents to the ED due to abdominal distention and SOB. He had liver biopsy and paracentesis on same day 07/09/2016. 2.2 liters fluid drained.  * Severe ascites likely secondary to hepatocellular carcinoma (biospy proven) and hypoalbuminemia and cirrhosis of liver  S/p Ultrasound paracentesis 3.3 liters removed on 07/14/16 -last one done on 07/09/16 2.3 liters -According to interventional radiology patient does not have much fluid for draining. They can assess  patient for Pleurx catheter placement as outpatient. -cont lasix and spironolactone  *Sepsis secondary to recurrent SBP  -Patient presented with diffuse abdominal tenderness with white blood cell count, fever of 101.2 tachycardia -Continue IV antibiotics with ceftriaxone --- will change to oral antibiotics tomorrow  * Hypervolemic hyponatremia.  -was  on IV lasix---not much improvement -sodium remains 121--121--122 -nephrology consult placed. ?tolvaptan With hepatocellular carcinoma, cirrhosis and ascites he would likely have a chronic hyponatremia. -Patient seems is not taking his medications appropriately at home.  * Hepatocellular carcinoma. -  consult oncology Dr. Grayland Ormond appreciated. NO PLANS for chemo at present since LFT's abnormal. -Appears patient is overall declining. -Palliative care consultation appreciated.  * DVT prophylaxis with SCDs. Patient had recent GI bleed due to esophageal varices. He also has elevated INR.  Case discussed with Care Management/Social Worker. Management plans discussed with the patient, family and they are in agreement.  CODE STATUS: Full code for now  DVT Prophylaxis: SCD  TOTAL TIME TAKING CARE OF THIS PATIENT: 25 minutes.  >50% time spent on counselling and coordination of care  POSSIBLE D/C IN one to 2 DAYS, DEPENDING ON CLINICAL CONDITION.  Note: This dictation was prepared with Dragon dictation along with smaller phrase technology. Any transcriptional errors that result from this process are unintentional.  Donald Bowers M.D on 07/18/2016 at 8:10 AM  Between 7am to 6pm - Pager - [phone]  After 6pm go to [email or web address] - password [email or web address] [facility]  Office  [phone]  CC: Primary care physician; Dion Body, MD

## 2016-07-18 NOTE — Care Management (Signed)
RNCM rounded on patient this AM and patient was independent to bathroom - talked to me through the door- denied any questions.

## 2016-07-18 NOTE — Consult Note (Signed)
[facility] CONSULT NOTE    Date: 07/18/2016                  Patient Name:  Donald Bowers  MRN: [id number]  DOB: 07-11-1955  Age / Sex: 61 y.o., male         PCP: Dion Body, MD                 Service Requesting Consult: Hospitalist                 Reason for Consult: hyponatremia            History of Present Illness: Patient is a 61 y.o. male with a PMHx of Cocaine abuse, COPD, alcohol abuse, history of prior GI bleed, hepatitis C, hepatocellular carcinoma, and pneumonia, who was admitted to [facility] on 07/14/2016 for evaluation of increasing abdominal distention and shortness of breath. The patient had liver biopsy and paracentesis performed on 07/09/16. This revealed hepatocellular carcinoma.  Currently the patient has poor functional status and is not immediately ready for chemotherapy. We are asked to see him for hypervolemic hyponatremia. Serum sodium has remained low through this admission. Unfortunately he does not appear to be a candidate for ADH antagonist given his liver dysfunction.  In addition he is currently being treated for recurrent spontaneous bacterial peritonitis. Gastroenterology has also been following.   Medications: Outpatient medications: Prescriptions Prior to Admission  Medication Sig Dispense Refill Last Dose  . amoxicillin-clavulanate (AUGMENTIN) 875-125 MG tablet Take 1 tablet by mouth 2 (two) times daily.   07/13/2016 at 2000  . folic acid (FOLVITE) 1 MG tablet Take 1 tablet (1 mg total) by mouth daily. 30 tablet 0 07/13/2016 at 2000  . furosemide (LASIX) 20 MG tablet Take 1 tablet (20 mg total) by mouth daily. 30 tablet 0 07/13/2016 at 2000  . pantoprazole (PROTONIX) 40 MG tablet Take 1 tablet (40 mg total) by mouth daily. 30 tablet 0 07/13/2016 at 2000  . Multiple Vitamin (MULTIVITAMIN WITH MINERALS) TABS tablet Take 1 tablet by mouth daily. (Patient not taking: Reported on 07/14/2016) 30 tablet 0 Not Taking at Unknown time   . sucralfate (CARAFATE) 1 GM/10ML suspension Take 10 mLs (1 g total) by mouth 4 (four) times daily -  with meals and at bedtime. (Patient not taking: Reported on 07/14/2016) 420 mL 0 Not Taking at Unknown time  . traMADol (ULTRAM) 50 MG tablet Take 1 tablet (50 mg total) by mouth every 6 (six) hours as needed for moderate pain. 30 tablet 0 prn at prn  . [DISCONTINUED] OxyCODONE HCl, Abuse Deter, (OXAYDO) 5 MG TABA Take 5 mg by mouth 2 (two) times daily as needed. (Patient not taking: Reported on 07/14/2016) 30 tablet 0 Not Taking at Unknown time    Current medications: Current Facility-Administered Medications  Medication Dose Route Frequency Provider Last Rate Last Dose  . acetaminophen (TYLENOL) tablet 650 mg  650 mg Oral Q6H PRN Hillary Bow, MD   650 mg at 07/14/16 2021   Or  . acetaminophen (TYLENOL) suppository 650 mg  650 mg Rectal Q6H PRN Sudini, Alveta Heimlich, MD      . albuterol (PROVENTIL) (2.5 MG/3ML) 0.083% nebulizer solution 2.5 mg  2.5 mg Nebulization Q2H PRN Sudini, Srikar, MD      . ciprofloxacin (CIPRO) tablet 500 mg  500 mg Oral BID Fritzi Mandes, MD   500 mg at 07/18/16 7672  . feeding supplement (ENSURE ENLIVE) (ENSURE ENLIVE) liquid 237 mL  237  mL Oral TID BM Fritzi Mandes, MD   237 mL at 07/18/16 0817  . furosemide (LASIX) tablet 40 mg  40 mg Oral BID Fritzi Mandes, MD   40 mg at 07/18/16 7341  . metoCLOPramide (REGLAN) tablet 10 mg  10 mg Oral QID Basilio Cairo, NP   10 mg at 07/18/16 9379  . morphine 2 MG/ML injection 2 mg  2 mg Intravenous Q6H PRN Fritzi Mandes, MD      . ondansetron [facility]) tablet 4 mg  4 mg Oral Q6H PRN Hillary Bow, MD       Or  . ondansetron (ZOFRAN) injection 4 mg  4 mg Intravenous Q6H PRN Hillary Bow, MD   4 mg at 07/16/16 2259  . oxyCODONE (Oxy IR/ROXICODONE) immediate release tablet 5 mg  5 mg Oral Q4H PRN Fritzi Mandes, MD   5 mg at 07/18/16 0240  . pantoprazole (PROTONIX) EC tablet 40 mg  40 mg Oral Daily Hillary Bow, MD   40 mg at 07/18/16 9735   . polyethylene glycol (MIRALAX / GLYCOLAX) packet 17 g  17 g Oral Daily PRN Hillary Bow, MD   17 g at 07/18/16 0851  . senna-docusate (Senokot-S) tablet 1 tablet  1 tablet Oral QHS Lenis Noon, [facility]   1 tablet at 07/17/16 2103  . sodium chloride flush (NS) 0.9 % injection 3 mL  3 mL Intravenous Q12H Sudini, Alveta Heimlich, MD   3 mL at 07/18/16 0817  . spironolactone (ALDACTONE) tablet 25 mg  25 mg Oral Daily Fritzi Mandes, MD   25 mg at 07/18/16 3299  . traMADol (ULTRAM) tablet 50 mg  50 mg Oral Q6H PRN Fritzi Mandes, MD      . traZODone (DESYREL) tablet 50 mg  50 mg Oral QHS PRN Hugelmeyer, Alexis, DO   50 mg at 07/14/16 2158      Allergies: No Known Allergies    Past Medical History: Past Medical History:  Diagnosis Date  . Arthritis   . Cocaine abuse   . COPD (chronic obstructive pulmonary disease) ([place])   . ETOH abuse   . GI bleed   . Hepatitis C   . Pneumonia      Past Surgical History: Past Surgical History:  Procedure Laterality Date  . CARDIAC CATHETERIZATION    . ESOPHAGOGASTRODUODENOSCOPY (EGD) WITH PROPOFOL N/A 06/21/2016   Procedure: ESOPHAGOGASTRODUODENOSCOPY (EGD) WITH PROPOFOL;  Surgeon: Wilford Corner, MD;  Location: [facility] ENDOSCOPY;  Service: Endoscopy;  Laterality: N/A;     Family History: Family History  Problem Relation Age of Onset  . COPD Mother   . Diabetes Sister   . Breast cancer Sister   . Cancer Father        back cancer  . Breast cancer Maternal Aunt   . Breast cancer Paternal Uncle      Social History: Social History   Social History  . Marital status: Divorced    Spouse name: N/A  . Number of children: N/A  . Years of education: N/A   Occupational History  . Not on file.   Social History Main Topics  . Smoking status: Former Smoker    Packs/day: 0.50    Types: Cigarettes  . Smokeless tobacco: Never Used     Comment: pt said he has all the information he needs   . Alcohol use 7.2 oz/week    12 Cans of beer per week      Comment: weekly/daily, pt report drinking 2 can in 2weeks  . Drug  use: Yes    Types: "Crack" cocaine     Comment: Last dose Wednesday 06/18/2016  . Sexual activity: Not on file   Other Topics Concern  . Not on file   Social History Narrative  . No narrative on file     Review of Systems: Review of Systems  Constitutional: Positive for chills, fever, malaise/fatigue and weight loss.  HENT: Negative for ear pain, hearing loss and tinnitus.   Eyes: Negative for blurred vision, double vision and photophobia.  Respiratory: Negative for cough, hemoptysis and sputum production.   Cardiovascular: Positive for orthopnea. Negative for chest pain and palpitations.  Gastrointestinal: Positive for abdominal pain and nausea. Negative for heartburn.  Genitourinary: Negative for dysuria, frequency and urgency.  Musculoskeletal: Negative for joint pain and myalgias.  Skin: Positive for itching.  Neurological: Negative for dizziness and focal weakness.  Endo/Heme/Allergies: Negative for polydipsia. Does not bruise/bleed easily.  Psychiatric/Behavioral: Positive for depression. The patient is not nervous/anxious.     Vital Signs: Blood pressure (!) 100/53, pulse (!) 103, temperature 98.2 F (36.8 C), temperature source Oral, resp. rate 20, height 6' (1.829 m), weight 94.3 kg (207 lb 12.8 oz), SpO2 97 %.  Weight trends: Filed Weights   07/16/16 0641 07/17/16 0500 07/18/16 0500  Weight: 78.7 kg (173 lb 6.4 oz) 78.4 kg (172 lb 14.4 oz) 94.3 kg (207 lb 12.8 oz)    Physical Exam: General: NAD, sitting up in chair  Head: Normocephalic, atraumatic.  Eyes: Icterus noted, EOMI  Nose: Mucous membranes moist, not inflammed, nonerythematous.  Throat: Oropharynx nonerythematous, no exudate appreciated.   Neck: Supple, trachea midline.  Lungs:  Diminished bilateral  Heart: RRR. S1 and S2 normal without gallop, murmur, or rubs.  Abdomen:  Distension noted, mild diffuse tenderness, BS present   Extremities: Massive anasarca  Neurologic: A&O X3, Motor strength is 5/5 in the all 4 extremities  Skin: No visible rashes, scars.    Lab results: Basic Metabolic Panel:  Recent Labs Lab 07/14/16 0924 07/15/16 0557 07/17/16 0440  NA 121* 121* 122*  K 4.3 4.4 5.0  CL 92* 92* 93*  CO2 24 23 25   GLUCOSE 150* 148* 114*  BUN 16 18 26*  CREATININE 0.96 0.88 1.06  CALCIUM 7.5* 7.1* 7.6*  MG 1.9  --   --     Liver Function Tests:  Recent Labs Lab 07/14/16 0924 07/15/16 0557  AST 119* 85*  ALT 25 21  ALKPHOS 121 94  BILITOT 5.8* 4.5*  PROT 7.8 6.2*  ALBUMIN 1.8* 1.4*    Recent Labs Lab 07/14/16 0924  LIPASE 33    Recent Labs Lab 07/14/16 1447  AMMONIA 18    CBC:  Recent Labs Lab 07/14/16 0924 07/15/16 0557  WBC 17.4* 17.1*  HGB 8.9* 8.5*  HCT 27.8* 26.6*  MCV 82.4 83.4  PLT 284 228    Cardiac Enzymes:  Recent Labs Lab 07/14/16 0924  TROPONINI <0.03    BNP: Invalid input(s): POCBNP  CBG: No results for input(s): GLUCAP in the last 168 hours.  Microbiology: Results for orders placed or performed during the hospital encounter of 07/14/16  Body fluid culture     Status: None (Preliminary result)   Collection Time: 07/14/16  1:07 PM  Result Value Ref Range Status   Specimen Description PERITONEAL  Final   Special Requests NONE  Final   Gram Stain   Final    MODERATE WBC PRESENT, PREDOMINANTLY PMN NO ORGANISMS SEEN    Culture   Final  NO GROWTH 3 DAYS Performed at [facility], [place] [address], [place], [place] [address]    Report Status PENDING  Incomplete  CULTURE, BLOOD (ROUTINE X 2) w Reflex to ID Panel     Status: None (Preliminary result)   Collection Time: 07/14/16  8:30 PM  Result Value Ref Range Status   Specimen Description BLOOD R HAND  Final   Special Requests Blood Culture adequate volume  Final   Culture NO GROWTH 4 DAYS  Final   Report Status PENDING  Incomplete  CULTURE, BLOOD (ROUTINE X 2) w Reflex to ID  Panel     Status: None (Preliminary result)   Collection Time: 07/14/16  8:36 PM  Result Value Ref Range Status   Specimen Description BLOOD L AC  Final   Special Requests Blood Culture adequate volume  Final   Culture NO GROWTH 4 DAYS  Final   Report Status PENDING  Incomplete    Coagulation Studies: No results for input(s): LABPROT, INR in the last 72 hours.  Urinalysis: No results for input(s): COLORURINE, LABSPEC, PHURINE, GLUCOSEU, HGBUR, BILIRUBINUR, KETONESUR, PROTEINUR, UROBILINOGEN, NITRITE, LEUKOCYTESUR in the last 72 hours.  Invalid input(s): APPERANCEUR    Imaging: US Abdomen Limited  Result Date: 07/18/2016 CLINICAL DATA:  61 year old male with cirrhosis, hepatocellular carcinoma and recurrent non malignant ascites. Most recent paracentesis performed 07/14/2016. Evaluate for recurrent fluid. EXAM: US ABDOMEN LIMITED - RIGHT UPPER QUADRANT COMPARISON:  Ultrasound paracentesis 07/14/2016 FINDINGS: Sonographic interrogation of the abdomen demonstrates a small to moderate volume of ascites. Morphologic changes of hepatic cirrhosis and splenomegaly consistent with portal hypertension. IMPRESSION: Recurrent ascites, currently small to moderate in volume. Electronically Signed   By: Jacqulynn Cadet M.D.   On: 07/18/2016 08:40      Assessment & Plan: Pt is a 61 y.o. male with a PMHx of Cocaine abuse, COPD, alcohol abuse, history of prior GI bleed, hepatitis C, hepatocellular carcinoma, and pneumonia, who was admitted to [facility] on 07/14/2016 for evaluation of increasing abdominal distention and shortness of breath.   1.  Severe hypervolemic hyponatremia. 2.  Generalized edema 3.  Cirrhosis of the liver 4.  Hepatocellular carcinoma 5.  Hypotension.  Plan:  The patient is severely ill at this point in time. He has significant cirrhosis associated with hepatocellular carcinoma. In addition he has hypervolemic hyponatremia with generalized edema. He does not appear to be a  candidate for chemotherapy at this point in time. Unfortunately we cannot offer him ADH antagonist given liver dysfunction.  At this point in time recommend discontinuation of by mouth Lasix and we will switch him to intravenous Lasix along with albumin 25 g IV every 8 hours. Continue spironolactone 25 mg by mouth daily. Overall prognosis is quite guarded given cirrhosis as well as hepatocellular carcinoma.

## 2016-07-18 NOTE — Progress Notes (Signed)
Daily Progress Note   Patient Name: Donald Bowers       Date: 07/18/16 DOB: 09/01/1955  Age: 61 y.o. MRN#: [id number] Attending Physician: Fritzi Mandes, MD Primary Care Physician: Dion Body, MD Admit Date: 07/14/2016  Reason for Consultation/Follow-up: Establishing goals of care  Subjective: Patient resting in chair this afternoon. Easily arouses to voice. States "I feel the same since I have been here" and "I haven't felt good for three months."   Sisters, [place] and [place], at bedside. Again discussed diagnoses, interventions, and guarded prognosis. Nephrology now following and increasing lasix and starting albumin. Family is relieved that he will not be discharged today. They are concerned about him returning home alone while son is at work.   Jacqlyn Larsen begins to question "when is enough? We may have to just make him comfortable." I encouraged they continue to discuss goals of care as a family through the weekend. Sisters have small hope that he could recover and start chemo but also seem realistic that he likely will continue to decline where focus should transition to comfort and symptom management.   Educated on hospice services. Answered questions and concerns. Provided emotional and spiritual support.   Length of Stay: 4  Current Medications: Scheduled Meds:  . ciprofloxacin  500 mg Oral BID  . feeding supplement (ENSURE ENLIVE)  237 mL Oral TID BM  . metoCLOPramide  10 mg Oral QID  . pantoprazole  40 mg Oral Daily  . senna-docusate  1 tablet Oral QHS  . sodium chloride flush  3 mL Intravenous Q12H  . spironolactone  25 mg Oral Daily    Continuous Infusions: . albumin human    . furosemide (LASIX) infusion      PRN Meds: acetaminophen **OR** acetaminophen,  albuterol, morphine injection, ondansetron **OR** ondansetron (ZOFRAN) IV, oxyCODONE, polyethylene glycol, traMADol, traZODone  Physical Exam  Constitutional: He is oriented to person, place, and time. He is cooperative. He appears ill.  HENT:  Head: Normocephalic and atraumatic.  Cardiovascular: Regular rhythm.   Pulmonary/Chest: Effort normal. He has decreased breath sounds.  Abdominal: He exhibits distension and ascites. There is tenderness.  Musculoskeletal: He exhibits edema (generalized).  Neurological: He is alert and oriented to person, place, and time.  drowsy  Skin: Skin is warm and dry.  Psychiatric: His speech is delayed. He  is withdrawn. He exhibits a depressed mood.  Nursing note and vitals reviewed.          Vital Signs: BP (!) 115/53 (BP Location: Right Arm)   Pulse 99   Temp 98.1 F (36.7 C) (Oral)   Resp 20   Ht 6' (1.829 m)   Wt 94.3 kg (207 lb 12.8 oz)   SpO2 96%   BMI 28.18 kg/m  SpO2: SpO2: 96 % O2 Device: O2 Device: Not Delivered O2 Flow Rate: O2 Flow Rate (L/min): 2 L/min  Intake/output summary:   Intake/Output Summary (Last 24 hours) at 07/18/16 1314 Last data filed at 07/18/16 1002  Gross per 24 hour  Intake              480 ml  Output              100 ml  Net              380 ml   LBM: Last BM Date: 07/13/16 Baseline Weight: Weight: 86.2 kg (190 lb) Most recent weight: Weight: 94.3 kg (207 lb 12.8 oz)   Palliative Assessment/Data: PPS 50%   Flowsheet Rows     Most Recent Value  Intake Tab  Referral Department  Hospitalist  Unit at Time of Referral  Oncology Unit  Palliative Care Primary Diagnosis  Cancer  Date Notified  07/15/16  Palliative Care Type  New Palliative care  Reason for referral  Clarify Goals of Care  Date of Admission  07/14/16  Date first seen by Palliative Care  07/15/16  # of days IP prior to Palliative referral  1  Clinical Assessment  Palliative Performance Scale Score  50%  Psychosocial & Spiritual Assessment   Palliative Care Outcomes  Patient/Family meeting held?  Yes  Who was at the meeting?  patient, sisters, and son via telephone  Palliative Care Outcomes  Clarified goals of care, Counseled regarding hospice, ACP counseling assistance, Provided psychosocial or spiritual support      Patient Active Problem List   Diagnosis Date Noted  . Alcoholic cirrhosis of liver with ascites ([place])   . Hepatocellular carcinoma ([place])   . Palliative care by specialist   . Goals of care, counseling/discussion   . SBP (spontaneous bacterial peritonitis) ([place])   . Ascitic fluid 07/14/2016  . Liver mass   . Hematemesis 06/19/2016  . Melena 06/19/2016  . COPD (chronic obstructive pulmonary disease) ([place]) 06/19/2016  . Hepatitis C 06/19/2016  . Hyponatremia 05/19/2016    Palliative Care Assessment & Plan   Patient Profile: 61 y.o. male  with past medical history of hepatitis C, ETOH and cocaine abuse, GI bleed, COPD, pneumonia, and arthritis admitted on 07/14/2016 with shortness of breath and abdominal distention. Followed by Dr. Grayland Ormond. Recently diagnosed with hepatocellular carcinoma. AFP >17,000. Paracentesis on 5/9 with 2.2 L removed. In ED, patient with severe ascites and underwent paracentesis with 3.3 L removed. Also with sepsis secondary to recurrent SBP and receiving ceftriaxone. Hypervolemic hyponatremia and receiving IV lasix. Palliative medicine consultation for goals of care.   Assessment: Hepatocellular carcinoma Severe ascites Sepsis Recurrent SBP Hypervolemic hyponatremia  Recommendations/Plan:  FULL code. Educated on recommendation for DNR/DNI with underlying cancer. Education materials provided to sister.   Watchful waiting. Nephrology now following.   Patient family considering palliative and hospice options. Eligible for hospice if they are agreeable.   PMT not at [facility] through the weekend but will f/u Monday, 5/21.  Goals of Care and Additional  Recommendations:  Limitations  on Scope of Treatment: Full Scope Treatment  Code Status: FULL   Code Status Orders        Start     Ordered   07/14/16 1112  Full code  Continuous     07/14/16 1113    Code Status History    Date Active Date Inactive Code Status Order ID Comments User Context   06/20/2016  2:55 AM 06/24/2016  3:03 PM Full Code [id number]  Lance Coon, MD Inpatient   05/19/2016  9:16 PM 05/22/2016  3:03 PM Full Code [id number]  Fritzi Mandes, MD Inpatient       Prognosis:   Unable to determine: guarded with hepatocellular carcinoma, ascites, recurrent SBP, and functional and nutritional status decline.   Discharge Planning:  To Be Determined  Care plan was discussed with patient, son, sisters, RN, Dr. Posey Pronto, and RN CM  Thank you for allowing the Palliative Medicine Team to assist in the care of this patient.   Time In: 1230 Time Out: 1315 Total Time 24min Prolonged Time Billed no      Greater than 50%  of this time was spent counseling and coordinating care related to the above assessment and plan.  Ihor Dow, FNP-C Palliative Medicine Team  Phone: [phone] Fax: [phone]  Please contact Palliative Medicine Team phone at [phone] for questions and concerns.

## 2016-07-19 LAB — CULTURE, BLOOD (ROUTINE X 2)
CULTURE: NO GROWTH
Culture: NO GROWTH
Special Requests: ADEQUATE
Special Requests: ADEQUATE

## 2016-07-19 NOTE — Progress Notes (Signed)
[facility] [place] NAME: Arvo Ealy    MR#:  [id number]  DATE OF BIRTH:  1955/04/08  Came in with increasing weakness and abdominal distention and shortness of breath Patient able to ambulate by himself. Severe leg edema REVIEW OF SYSTEMS:   Review of Systems  Constitutional: Negative for chills, fever and weight loss.  HENT: Negative for ear discharge, ear pain and nosebleeds.   Eyes: Negative for blurred vision, pain and discharge.  Respiratory: Positive for shortness of breath. Negative for sputum production, wheezing and stridor.   Cardiovascular: Positive for orthopnea. Negative for chest pain, palpitations and PND.  Gastrointestinal: Positive for abdominal pain. Negative for diarrhea, nausea and vomiting.  Genitourinary: Negative for frequency and urgency.  Musculoskeletal: Negative for back pain and joint pain.  Neurological: Positive for weakness. Negative for sensory change, speech change and focal weakness.  Psychiatric/Behavioral: Negative for depression and hallucinations. The patient is not nervous/anxious.    Tolerating Diet:yes Tolerating PT: ambulatory  DRUG ALLERGIES:  No Known Allergies  VITALS:  Blood pressure (!) 111/58, pulse (!) 114, temperature 99.9 F (37.7 C), temperature source Oral, resp. rate (!) 23, height 6' (1.829 m), weight 95.8 kg (211 lb 4.8 oz), SpO2 95 %.  PHYSICAL EXAMINATION:   Physical Exam  GENERAL:  61 y.o.-year-old patient lying in the bed with no acute distress. Chronically ill anasarca++ EYES: Pupils equal, round, reactive to light and accommodation.+ scleral icterus. Extraocular muscles intact.  HEENT: Head atraumatic, normocephalic. Oropharynx and nasopharynx clear.  oral mucosa dry  NECK:  Supple, no jugular venous distention. No thyroid enlargement, no tenderness.  LUNGS: Normal breath sounds bilaterally, no wheezing, rales, rhonchi. No use of accessory muscles of  respiration.  CARDIOVASCULAR: S1, S2 normal. No murmurs, rubs, or gallops.  ABDOMEN: Soft, nontender, severely distended. Bowel sounds present. No organomegaly or mass. Ascites++  EXTREMITIES: No cyanosis, clubbing  Edema+++    NEUROLOGIC: Cranial nerves II through XII are intact. No focal Motor or sensory deficits b/l.   PSYCHIATRIC:  patient is alert and oriented x 3.  SKIN: No obvious rash, lesion, or ulcer.   LABORATORY PANEL:  CBC  Recent Labs Lab 07/15/16 0557  WBC 17.1*  HGB 8.5*  HCT 26.6*  PLT 228    Chemistries   Recent Labs Lab 07/14/16 0924 07/15/16 0557 07/17/16 0440  NA 121* 121* 122*  K 4.3 4.4 5.0  CL 92* 92* 93*  CO2 24 23 25   GLUCOSE 150* 148* 114*  BUN 16 18 26*  CREATININE 0.96 0.88 1.06  CALCIUM 7.5* 7.1* 7.6*  MG 1.9  --   --   AST 119* 85*  --   ALT 25 21  --   ALKPHOS 121 94  --   BILITOT 5.8* 4.5*  --    Cardiac Enzymes  Recent Labs Lab 07/14/16 0924  TROPONINI <0.03   RADIOLOGY:  US Abdomen Limited  Result Date: 07/18/2016 CLINICAL DATA:  61 year old male with cirrhosis, hepatocellular carcinoma and recurrent non malignant ascites. Most recent paracentesis performed 07/14/2016. Evaluate for recurrent fluid. EXAM: US ABDOMEN LIMITED - RIGHT UPPER QUADRANT COMPARISON:  Ultrasound paracentesis 07/14/2016 FINDINGS: Sonographic interrogation of the abdomen demonstrates a small to moderate volume of ascites. Morphologic changes of hepatic cirrhosis and splenomegaly consistent with portal hypertension. IMPRESSION: Recurrent ascites, currently small to moderate in volume. Electronically Signed   By: Jacqulynn Cadet M.D.   On: 07/18/2016 08:40   ASSESSMENT AND PLAN:   Doren Custard  Bidinger  is a 61 y.o. male with a known history of recently diagnosed HCC, Hep C, Alcohol abuse, presents to the ED due to abdominal distention and SOB. He had liver biopsy and paracentesis on same day 07/09/2016. 2.2 liters fluid drained.  * Severe ascites likely  secondary to hepatocellular carcinoma (biospy proven) and hypoalbuminemia and cirrhosis of liver  S/p Ultrasound paracentesis 3.3 liters removed on 07/14/16 -last one done on 07/09/16 2.3 liters -According to interventional radiology patient does not have much fluid for draining. They can assess patient for Pleurx catheter placement as outpatient. -cont lasix and spironolactone -started on Lasix gtt and IV albumin by Dr Holley Raring  *Sepsis secondary to recurrent SBP  -Patient presented with diffuse abdominal tenderness with white blood cell count, fever of 101.2 tachycardia -Continue IV antibiotics with ceftriaxone --- will change to oral antibiotics tomorrow  * Hypervolemic hyponatremia.  -was on IV lasix---not much improvement -sodium remains 121--121--122 -nephrology consult appreciated With hepatocellular carcinoma, cirrhosis and ascites he would likely have a chronic hyponatremia. -Patient seems is not taking his medications appropriately at home.  * Hepatocellular carcinoma. -  consult oncology Dr. Grayland Ormond appreciated. NO PLANS for chemo at present since LFT's abnormal. -Appears patient is overall declining. -Palliative care consultation appreciated.  * DVT prophylaxis with SCDs. Patient had recent GI bleed due to esophageal varices. He also has elevated INR.  Case discussed with Care Management/Social Worker. Management plans discussed with the patient, family and they are in agreement.  CODE STATUS: Full code for now  DVT Prophylaxis: SCD  TOTAL TIME TAKING CARE OF THIS PATIENT: 25 minutes.  >50% time spent on counselling and coordination of care  POSSIBLE D/C IN one to 2 DAYS, DEPENDING ON CLINICAL CONDITION.  Note: This dictation was prepared with Dragon dictation along with smaller phrase technology. Any transcriptional errors that result from this process are unintentional.  Cassandr Cederberg M.D on 07/19/2016 at 11:35 AM  Between 7am to 6pm - Pager - [phone]  After  6pm go to [email or web address] - password [email or web address] [place]  Office  [phone]  CC: Primary care physician; Dion Body, MD

## 2016-07-19 NOTE — Progress Notes (Signed)
[facility]  ROUNDING NOTE   Subjective:  Patient still has considerable edema. Reasonable urine output was noted. Patient sitting up in chair.   Objective:  Vital signs in last 24 hours:  Temp:  [98.9 F (37.2 C)-99.9 F (37.7 C)] 99.9 F (37.7 C) (05/19 0503) Pulse Rate:  [96-114] 114 (05/19 0503) Resp:  [21-23] 23 (05/19 0503) BP: (111-123)/(56-66) 111/58 (05/19 0503) SpO2:  [95 %] 95 % (05/19 0503) Weight:  [95.8 kg (211 lb 4.8 oz)] 95.8 kg (211 lb 4.8 oz) (05/19 0500)  Weight change: 1.588 kg (3 lb 8 oz) Filed Weights   07/17/16 0500 07/18/16 0500 07/19/16 0500  Weight: 78.4 kg (172 lb 14.4 oz) 94.3 kg (207 lb 12.8 oz) 95.8 kg (211 lb 4.8 oz)    Intake/Output: I/O last 3 completed shifts: In: 755.5 [P.O.:480; I.V.:75.5; IV Piggyback:200] Out: 1250 [Urine:1250]   Intake/Output this shift:  Total I/O In: 240 [P.O.:240] Out: 350 [Urine:350]  Physical Exam: General: No acute distress  Head: Normocephalic, atraumatic. Moist oral mucosal membranes  Eyes: Anicteric  Neck: Supple, trachea midline  Lungs:  Clear to auscultation, normal effort  Heart: S1S2 no rubs  Abdomen:  Soft, nontender, distension noted  Extremities: 3+ peripheral edema.  Neurologic: Awake, alert, following commands  Skin: No lesions       Basic Metabolic Panel:  Recent Labs Lab 07/14/16 0924 07/15/16 0557 07/17/16 0440  NA 121* 121* 122*  K 4.3 4.4 5.0  CL 92* 92* 93*  CO2 24 23 25   GLUCOSE 150* 148* 114*  BUN 16 18 26*  CREATININE 0.96 0.88 1.06  CALCIUM 7.5* 7.1* 7.6*  MG 1.9  --   --     Liver Function Tests:  Recent Labs Lab 07/14/16 0924 07/15/16 0557  AST 119* 85*  ALT 25 21  ALKPHOS 121 94  BILITOT 5.8* 4.5*  PROT 7.8 6.2*  ALBUMIN 1.8* 1.4*    Recent Labs Lab 07/14/16 0924  LIPASE 33    Recent Labs Lab 07/14/16 1447  AMMONIA 18    CBC:  Recent Labs Lab 07/14/16 0924 07/15/16 0557  WBC 17.4* 17.1*  HGB 8.9* 8.5*  HCT 27.8*  26.6*  MCV 82.4 83.4  PLT 284 228    Cardiac Enzymes:  Recent Labs Lab 07/14/16 0924  TROPONINI <0.03    BNP: Invalid input(s): POCBNP  CBG: No results for input(s): GLUCAP in the last 168 hours.  Microbiology: Results for orders placed or performed during the hospital encounter of 07/14/16  Body fluid culture     Status: None   Collection Time: 07/14/16  1:07 PM  Result Value Ref Range Status   Specimen Description PERITONEAL  Final   Special Requests NONE  Final   Gram Stain   Final    MODERATE WBC PRESENT, PREDOMINANTLY PMN NO ORGANISMS SEEN    Culture   Final    NO GROWTH 3 DAYS Performed at [facility], [place] [address], [place], [place] [address]    Report Status 07/18/2016 FINAL  Final  CULTURE, BLOOD (ROUTINE X 2) w Reflex to ID Panel     Status: None   Collection Time: 07/14/16  8:30 PM  Result Value Ref Range Status   Specimen Description BLOOD R HAND  Final   Special Requests Blood Culture adequate volume  Final   Culture NO GROWTH 5 DAYS  Final   Report Status 07/19/2016 FINAL  Final  CULTURE, BLOOD (ROUTINE X 2) w Reflex to ID Panel  Status: None   Collection Time: 07/14/16  8:36 PM  Result Value Ref Range Status   Specimen Description BLOOD L AC  Final   Special Requests Blood Culture adequate volume  Final   Culture NO GROWTH 5 DAYS  Final   Report Status 07/19/2016 FINAL  Final    Coagulation Studies: No results for input(s): LABPROT, INR in the last 72 hours.  Urinalysis: No results for input(s): COLORURINE, LABSPEC, PHURINE, GLUCOSEU, HGBUR, BILIRUBINUR, KETONESUR, PROTEINUR, UROBILINOGEN, NITRITE, LEUKOCYTESUR in the last 72 hours.  Invalid input(s): APPERANCEUR    Imaging: US Abdomen Limited  Result Date: 07/18/2016 CLINICAL DATA:  61 year old male with cirrhosis, hepatocellular carcinoma and recurrent non malignant ascites. Most recent paracentesis performed 07/14/2016. Evaluate for recurrent fluid. EXAM: US ABDOMEN LIMITED -  RIGHT UPPER QUADRANT COMPARISON:  Ultrasound paracentesis 07/14/2016 FINDINGS: Sonographic interrogation of the abdomen demonstrates a small to moderate volume of ascites. Morphologic changes of hepatic cirrhosis and splenomegaly consistent with portal hypertension. IMPRESSION: Recurrent ascites, currently small to moderate in volume. Electronically Signed   By: Jacqulynn Cadet M.D.   On: 07/18/2016 08:40     Medications:   . albumin human    . furosemide (LASIX) infusion 5 mg/hr (07/18/16 1339)   . ciprofloxacin  500 mg Oral BID  . feeding supplement (ENSURE ENLIVE)  237 mL Oral TID BM  . metoCLOPramide  10 mg Oral QID  . pantoprazole  40 mg Oral Daily  . senna-docusate  1 tablet Oral QHS  . sodium chloride flush  3 mL Intravenous Q12H  . spironolactone  25 mg Oral Daily   acetaminophen **OR** acetaminophen, albuterol, morphine injection, ondansetron **OR** ondansetron (ZOFRAN) IV, oxyCODONE, polyethylene glycol, traMADol, traZODone  Assessment/ Plan:  61 y.o. male with a PMHx of Cocaine abuse, COPD, alcohol abuse, history of prior GI bleed, hepatitis C, hepatocellular carcinoma, and pneumonia, who was admitted to [facility] on 07/14/2016 for evaluation of increasing abdominal distention and shortness of breath.   1.  Severe hypervolemic hyponatremia. 2.  Generalized edema 3.  Cirrhosis of the liver 4.  Hepatocellular carcinoma 5.  Hypotension.  Plan:  Patient still has significant hyponatremia. This will likely prove difficult to treat as highlighted before. Cannot administer ADH antagonist secondary to liver failure. In addition hypertonic saline would not be recommended given severe volume overload. Continue Lasix drip but we will increase this to 8 mg per hour. We will also maintain the patient on albumin. We also plan to place the patient on [place] traction of 1.2 L per day.   LOS: 5 Jorel Gravlin 5/19/20181:09 PM

## 2016-07-20 LAB — BASIC METABOLIC PANEL
Anion gap: 7 (ref 5–15)
BUN: 30 mg/dL — AB (ref 6–20)
CO2: 27 mmol/L (ref 22–32)
CREATININE: 0.77 mg/dL (ref 0.61–1.24)
Calcium: 8.4 mg/dL — ABNORMAL LOW (ref 8.9–10.3)
Chloride: 91 mmol/L — ABNORMAL LOW (ref 101–111)
GFR calc Af Amer: >60 mL/min (ref >60)
GLUCOSE: 132 mg/dL — AB (ref 65–99)
POTASSIUM: 4.7 mmol/L (ref 3.5–5.1)
SODIUM: 125 mmol/L — AB (ref 135–145)

## 2016-07-20 NOTE — Progress Notes (Signed)
[facility] at [place] NAME: Donald Bowers    MR#:  [id number]  DATE OF BIRTH:  [birth year]  Came in with increasing weakness and abdominal distention and shortness of breath Patient able to ambulate by himself. Severe leg edema UOP 2500cc REVIEW OF SYSTEMS:   Review of Systems  Constitutional: Negative for chills, fever and weight loss.  HENT: Negative for ear discharge, ear pain and nosebleeds.   Eyes: Negative for blurred vision, pain and discharge.  Respiratory: Positive for shortness of breath. Negative for sputum production, wheezing and stridor.   Cardiovascular: Positive for leg swelling. Negative for chest pain, palpitations and PND.  Gastrointestinal: Positive for abdominal pain. Negative for diarrhea, nausea and vomiting.  Genitourinary: Negative for frequency and urgency.  Musculoskeletal: Negative for back pain and joint pain.  Neurological: Positive for weakness. Negative for sensory change, speech change and focal weakness.  Psychiatric/Behavioral: Negative for depression and hallucinations. The patient is not nervous/anxious.    Tolerating Diet:yes Tolerating PT: ambulatory  DRUG ALLERGIES:  No Known Allergies  VITALS:  Blood pressure (!) 122/59, pulse (!) 114, temperature 99.9 F (37.7 C), temperature source Oral, resp. rate 16, height 6' (1.829 m), weight 94.6 kg (208 lb 8 oz), SpO2 92 %.  PHYSICAL EXAMINATION:   Physical Exam  GENERAL:  61 y.o.-year-old patient lying in the bed with no acute distress. Chronically ill anasarca++ EYES: Pupils equal, round, reactive to light and accommodation.+ scleral icterus. Extraocular muscles intact.  HEENT: Head atraumatic, normocephalic. Oropharynx and nasopharynx clear.  oral mucosa dry  NECK:  Supple, no jugular venous distention. No thyroid enlargement, no tenderness.  LUNGS: Normal breath sounds bilaterally, no wheezing, rales, rhonchi. No use of accessory muscles  of respiration.  CARDIOVASCULAR: S1, S2 normal. No murmurs, rubs, or gallops.  ABDOMEN: Soft, nontender, severely distended. Bowel sounds present. No organomegaly or mass. Ascites++  EXTREMITIES: No cyanosis, clubbing  Edema+++    NEUROLOGIC: Cranial nerves II through XII are intact. No focal Motor or sensory deficits b/l.   PSYCHIATRIC:  patient is alert and oriented x 3.  SKIN: No obvious rash, lesion, or ulcer.   LABORATORY PANEL:  CBC  Recent Labs Lab 07/15/16 0557  WBC 17.1*  HGB 8.5*  HCT 26.6*  PLT 228    Chemistries   Recent Labs Lab 07/14/16 0924 07/15/16 0557 07/17/16 0440  NA 121* 121* 122*  K 4.3 4.4 5.0  CL 92* 92* 93*  CO2 24 23 25   GLUCOSE 150* 148* 114*  BUN 16 18 26*  CREATININE 0.96 0.88 1.06  CALCIUM 7.5* 7.1* 7.6*  MG 1.9  --   --   AST 119* 85*  --   ALT 25 21  --   ALKPHOS 121 94  --   BILITOT 5.8* 4.5*  --    Cardiac Enzymes  Recent Labs Lab 07/14/16 0924  TROPONINI <0.03   RADIOLOGY:  No results found. ASSESSMENT AND PLAN:   Donald Bowers  is a 61 y.o. male with a known history of recently diagnosed HCC, Hep C, Alcohol abuse, presents to the ED due to abdominal distention and SOB. He had liver biopsy and paracentesis on same day 07/09/2016. 2.2 liters fluid drained.  * Severe ascites likely secondary to hepatocellular carcinoma (biospy proven) and hypoalbuminemia and cirrhosis of liver  S/p Ultrasound paracentesis 3.3 liters removed on 07/14/16 -last one done on 07/09/16 2.3 liters -According to interventional radiology patient does not have much fluid for draining.  They can assess patient for Pleurx catheter placement as outpatient. -cont lasix and spironolactone -started on Lasix gtt and IV albumin by Dr lateef----good diuresis  *Sepsis secondary to recurrent SBP  -Patient presented with diffuse abdominal tenderness with white blood cell count, fever of 101.2 tachycardia -Continue IV antibiotics with ceftriaxone ---now on oral  antibiotics   * Hypervolemic hyponatremia.  -was on IV lasix---not much improvement -sodium remains 121--121--122---BMP today -nephrology consult appreciated With hepatocellular carcinoma, cirrhosis and ascites he would likely have a chronic hyponatremia. -Patient seems is not taking his medications appropriately at home.  * Hepatocellular carcinoma. -consult oncology Dr. Grayland Ormond appreciated. NO PLANS for chemo at present since LFT's abnormal. -Appears patient is overall declining. -Palliative care consultation appreciated.  * DVT prophylaxis with SCDs. - Patient had recent GI bleed due to esophageal varices. He also has elevated INR.  Case discussed with Care Management/Social Worker. Management plans discussed with the patient, family and they are in agreement.  CODE STATUS: Full code for now  DVT Prophylaxis: SCD  TOTAL TIME TAKING CARE OF THIS PATIENT: 25 minutes.  >50% time spent on counselling and coordination of care  POSSIBLE D/C IN one to 2 DAYS, DEPENDING ON CLINICAL CONDITION.  Note: This dictation was prepared with Dragon dictation along with smaller phrase technology. Any transcriptional errors that result from this process are unintentional.  Cleburne Savini M.D on 07/20/2016 at 7:35 AM  Between 7am to 6pm - Pager - [phone]  After 6pm go to [email or web address] - password [email or web address] [place] [facility]  Office  [phone]  CC: Primary care physician; Dion Body, MD

## 2016-07-20 NOTE — Progress Notes (Signed)
[facility]  ROUNDING NOTE   Subjective:  Patient had good urine output yesterday. He was net -1.3 L. Serum sodium up to 125.    Objective:  Vital signs in last 24 hours:  Temp:  [98.5 F (36.9 C)-99.9 F (37.7 C)] 99.9 F (37.7 C) (05/20 0441) Pulse Rate:  [103-114] 114 (05/20 0441) Resp:  [16] 16 (05/20 0441) BP: (118-122)/(59) 122/59 (05/20 0441) SpO2:  [92 %-98 %] 92 % (05/20 0441) Weight:  [94.6 kg (208 lb 8 oz)] 94.6 kg (208 lb 8 oz) (05/20 0500)  Weight change: -1.27 kg (-2 lb 12.8 oz) Filed Weights   07/18/16 0500 07/19/16 0500 07/20/16 0500  Weight: 94.3 kg (207 lb 12.8 oz) 95.8 kg (211 lb 4.8 oz) 94.6 kg (208 lb 8 oz)    Intake/Output: I/O last 3 completed shifts: In: 8938 [P.O.:600; I.V.:197; IV Piggyback:300] Out: 3000 [Urine:3000]   Intake/Output this shift:  Total I/O In: 240 [P.O.:240] Out: 400 [Urine:400]  Physical Exam: General: No acute distress  Head: Normocephalic, atraumatic. Moist oral mucosal membranes  Eyes: Anicteric  Neck: Supple, trachea midline  Lungs:  Clear to auscultation, normal effort  Heart: S1S2 no rubs  Abdomen:  Soft, nontender, distension noted  Extremities: 3+ peripheral edema, extending into lower abdomen  Neurologic: Awake, alert, following commands  Skin: No lesions       Basic Metabolic Panel:  Recent Labs Lab 07/14/16 0924 07/15/16 0557 07/17/16 0440 07/20/16 0733  NA 121* 121* 122* 125*  K 4.3 4.4 5.0 4.7  CL 92* 92* 93* 91*  CO2 24 23 25 27   GLUCOSE 150* 148* 114* 132*  BUN 16 18 26* 30*  CREATININE 0.96 0.88 1.06 0.77  CALCIUM 7.5* 7.1* 7.6* 8.4*  MG 1.9  --   --   --     Liver Function Tests:  Recent Labs Lab 07/14/16 0924 07/15/16 0557  AST 119* 85*  ALT 25 21  ALKPHOS 121 94  BILITOT 5.8* 4.5*  PROT 7.8 6.2*  ALBUMIN 1.8* 1.4*    Recent Labs Lab 07/14/16 0924  LIPASE 33    Recent Labs Lab 07/14/16 1447  AMMONIA 18    CBC:  Recent Labs Lab 07/14/16 0924  07/15/16 0557  WBC 17.4* 17.1*  HGB 8.9* 8.5*  HCT 27.8* 26.6*  MCV 82.4 83.4  PLT 284 228    Cardiac Enzymes:  Recent Labs Lab 07/14/16 0924  TROPONINI <0.03    BNP: Invalid input(s): POCBNP  CBG: No results for input(s): GLUCAP in the last 168 hours.  Microbiology: Results for orders placed or performed during the hospital encounter of 07/14/16  Body fluid culture     Status: None   Collection Time: 07/14/16  1:07 PM  Result Value Ref Range Status   Specimen Description PERITONEAL  Final   Special Requests NONE  Final   Gram Stain   Final    MODERATE WBC PRESENT, PREDOMINANTLY PMN NO ORGANISMS SEEN    Culture   Final    NO GROWTH 3 DAYS Performed at [facility], [place] [address], [place], [place] [address]    Report Status 07/18/2016 FINAL  Final  CULTURE, BLOOD (ROUTINE X 2) w Reflex to ID Panel     Status: None   Collection Time: 07/14/16  8:30 PM  Result Value Ref Range Status   Specimen Description BLOOD R HAND  Final   Special Requests Blood Culture adequate volume  Final   Culture NO GROWTH 5 DAYS  Final   Report Status 07/19/2016 FINAL  Final  CULTURE, BLOOD (ROUTINE X 2) w Reflex to ID Panel     Status: None   Collection Time: 07/14/16  8:36 PM  Result Value Ref Range Status   Specimen Description BLOOD L AC  Final   Special Requests Blood Culture adequate volume  Final   Culture NO GROWTH 5 DAYS  Final   Report Status 07/19/2016 FINAL  Final    Coagulation Studies: No results for input(s): LABPROT, INR in the last 72 hours.  Urinalysis: No results for input(s): COLORURINE, LABSPEC, PHURINE, GLUCOSEU, HGBUR, BILIRUBINUR, KETONESUR, PROTEINUR, UROBILINOGEN, NITRITE, LEUKOCYTESUR in the last 72 hours.  Invalid input(s): APPERANCEUR    Imaging: No results found.   Medications:   . albumin human    . furosemide (LASIX) infusion 10 mg/hr (07/20/16 1004)   . ciprofloxacin  500 mg Oral BID  . feeding supplement (ENSURE ENLIVE)  237 mL  Oral TID BM  . metoCLOPramide  10 mg Oral QID  . pantoprazole  40 mg Oral Daily  . senna-docusate  1 tablet Oral QHS  . sodium chloride flush  3 mL Intravenous Q12H  . spironolactone  25 mg Oral Daily   acetaminophen **OR** acetaminophen, albuterol, morphine injection, ondansetron **OR** ondansetron (ZOFRAN) IV, oxyCODONE, polyethylene glycol, traMADol, traZODone  Assessment/ Plan:  61 y.o. male with a PMHx of Cocaine abuse, COPD, alcohol abuse, history of prior GI bleed, hepatitis C, hepatocellular carcinoma, and pneumonia, who was admitted to [facility] on 07/14/2016 for evaluation of increasing abdominal distention and shortness of breath.   1.  Severe hypervolemic hyponatremia. 2.  Generalized edema (R60.1) 3.  Cirrhosis of the liver 4.  Hepatocellular carcinoma 5.  Hypotension.  Plan:  Good urine output noted yesterday. However patient was net -1.3 L. We increased Lasix to 10 mg per hour intravenously. Continue spironolactone 25 mg daily as well. Serum sodium is up to 125. We would like to continue to diuresis the patient for at least 1 additional day. Continue albumen to augment diuresis as well. We will continue to monitor the patient's progress.    LOS: 6 Naquita Nappier 5/20/201811:03 AM

## 2016-07-21 MED ORDER — FUROSEMIDE 40 MG PO TABS: 40.0000 mg | ORAL_TABLET | Freq: Every day | ORAL | 1 refills | Status: DC

## 2016-07-21 MED ORDER — CIPROFLOXACIN HCL 500 MG PO TABS: 500.0000 mg | ORAL_TABLET | Freq: Every day | ORAL | Status: DC

## 2016-07-21 MED ORDER — FUROSEMIDE 40 MG PO TABS
40.0000 mg | ORAL_TABLET | Freq: Two times a day (BID) | ORAL | Status: DC
  Administered 2016-07-21: 09:00:00 40 mg via ORAL
  Filled 2016-07-21 (×2): qty 1

## 2016-07-21 MED ORDER — CIPROFLOXACIN HCL 500 MG PO TABS: ORAL_TABLET | ORAL | 1 refills | Status: DC

## 2016-07-21 NOTE — Care Management (Signed)
Discharge to home today per Dr. Posey Pronto Will be followed by [facility]. Flo Shanks RN representative for [employer] updated. Family/friend will transport. Shelbie Ammons RN MSN CCM Care Management [phone]

## 2016-07-21 NOTE — Progress Notes (Signed)
New referral for [facility] home health follow up visit. Mr. Anastasi is a 61 year old man  with past medical history of hepatitis C, ETOH and cocaine abuse, GI bleed, COPD, pneumonia, and arthritis admitted on 07/14/2016 with shortness of breath and abdominal distention. He is followed Dr. Grayland Ormond for a recent diagnosis of  hepatocellular carcinoma. AFP >17,000. He had a paracentesis on 5/9 with 2.2 L removed. In the  ED, he was found to have severe ascites and underwent paracentesis with 3.3 L removed. He has be treated with IV antibiotics for sepsis secondary to recurrent SBP, as well as hypernatremia. Palliative medicine was consulted for goals of care. Patient to discharge home today, where he lives with his son.  Patient seen standing at bedside, sister Ruby present. Patient alert and interactive, aware of going home. Per Bertram Millard his son will be at the home late tonight, she will stay with him to "be sure he is settled". Ruby agreed to be the contact for [facility] referral to contact. [facility] information and contact number left with Ruby. Staff RN Butch Penny in to give discharge instructions at end of visit. Discharge summary F2F and [place] orders faxed to referral. Thank you. Flo Shanks Rn, BSN, [facility] liaison [phone] c

## 2016-07-21 NOTE — Discharge Instructions (Signed)
[facility] to follow

## 2016-07-21 NOTE — Discharge Summary (Signed)
[place] at [place] NAME: Donald Bowers    MR#:  [id number]  DATE OF BIRTH:  1956-01-25  DATE OF ADMISSION:  07/14/2016 ADMITTING PHYSICIAN: Hillary Bow, MD  DATE OF DISCHARGE: 07/21/16  PRIMARY CARE PHYSICIAN: Dion Body, MD    ADMISSION DIAGNOSIS:  Shortness of breath [R06.02] Acute hyponatremia [E87.1] Abdominal distension [R14.0] Other ascites [F68.1] Alcoholic cirrhosis of liver with ascites (HCC) [K70.31]  DISCHARGE DIAGNOSIS:  Severe Anasarca due to cirrhosis of liver and Hepatocellular carcinoma Recurrent ascites s/p paracentesis recently x2 Chronic hyponatremia due to severe hypervolemia/anasarca SECONDARY DIAGNOSIS:   Past Medical History:  Diagnosis Date  . Arthritis   . Cocaine abuse   . COPD (chronic obstructive pulmonary disease) ([place])   . ETOH abuse   . GI bleed   . Hepatitis C   . Pneumonia     HOSPITAL COURSE:   PhillipYarbroughis a 61 y.o.malewith a known history of recently diagnosed HCC, Hep C, Alcohol abuse, presents to the ED due to abdominal distention and SOB. He had liver biopsy and paracentesis on same day 07/09/2016. 2.2 liters fluid drained.  * Severe ascites likely secondary to hepatocellular carcinoma (biospy proven) and hypoalbuminemia and cirrhosis of liver  S/p Ultrasound paracentesis 3.3 liters removed on 07/14/16 -last one done on 07/09/16 2.3 liters -According to interventional radiology patient does not have much fluid for draining. They can assess patient for Pleurx catheter placement as outpatient. -cont lasix and spironolactone  *Sepsis secondary to recurrent SBP  -Patient presented with diffuse abdominal tenderness with white blood cell count, fever of 101.2 tachycardia -Completed IV antibiotics with ceftriaxone ---now on oral antibiotics with cipro daily to prevent SBP  * Hypervolemic hyponatremia.  -was on IV lasix---not much improvement -sodium remains  121--121--122--125 -nephrology consult appreciated. Pt was on IV lasix drip with IV albumin since Friday, NOt much improvement. Change to oral lasix now. With hepatocellular carcinoma, cirrhosis and ascites he would likely have a chronic hyponatremia.  * Hepatocellular carcinoma. -consult oncology Dr. Grayland Ormond appreciated. NO PLANS for chemo at present since LFT's abnormal. -Appears patient is overall declining. -Palliative care consultation appreciated.  * DVT prophylaxis with SCDs. - Patient had recent GI bleed due to esophageal varices. He also has elevated INR.  Will d/c pt home with [facility] today CODE STATUS: Full code for now CONSULTS OBTAINED:  Treatment Team:  Lloyd Huger, MD [name], MD Anthonette Legato, MD  DRUG ALLERGIES:  No Known Allergies  DISCHARGE MEDICATIONS:   Current Discharge Medication List    START taking these medications   Details  ciprofloxacin (CIPRO) 500 MG tablet Take 1 tab daily Qty: 30 tablet, Refills: 1    feeding supplement, ENSURE ENLIVE, (ENSURE ENLIVE) LIQD Take 237 mLs by mouth 3 (three) times daily between meals. Qty: 237 mL, Refills: 12    spironolactone (ALDACTONE) 25 MG tablet Take 1 tablet (25 mg total) by mouth daily. Qty: 30 tablet, Refills: 1      CONTINUE these medications which have CHANGED   Details  furosemide (LASIX) 40 MG tablet Take 1 tablet (40 mg total) by mouth daily. Qty: 60 tablet, Refills: 1    OxyCODONE HCl, Abuse Deter, (OXAYDO) 5 MG TABA Take 5 mg by mouth 2 (two) times daily as needed. Qty: 25 tablet, Refills: 0      CONTINUE these medications which have NOT CHANGED   Details  folic acid (FOLVITE) 1 MG tablet Take 1 tablet (1 mg total) by mouth  daily. Qty: 30 tablet, Refills: 0    pantoprazole (PROTONIX) 40 MG tablet Take 1 tablet (40 mg total) by mouth daily. Qty: 30 tablet, Refills: 0    Multiple Vitamin (MULTIVITAMIN WITH MINERALS) TABS tablet Take 1 tablet by mouth daily. Qty: 30  tablet, Refills: 0    sucralfate (CARAFATE) 1 GM/10ML suspension Take 10 mLs (1 g total) by mouth 4 (four) times daily -  with meals and at bedtime. Qty: 420 mL, Refills: 0    traMADol (ULTRAM) 50 MG tablet Take 1 tablet (50 mg total) by mouth every 6 (six) hours as needed for moderate pain. Qty: 30 tablet, Refills: 0      STOP taking these medications     amoxicillin-clavulanate (AUGMENTIN) 875-125 MG tablet         If you experience worsening of your admission symptoms, develop shortness of breath, life threatening emergency, suicidal or homicidal thoughts you must seek medical attention immediately by calling 911 or calling your MD immediately  if symptoms less severe.  You Must read complete instructions/literature along with all the possible adverse reactions/side effects for all the Medicines you take and that have been prescribed to you. Take any new Medicines after you have completely understood and accept all the possible adverse reactions/side effects.   Please note  You were cared for by a hospitalist during your hospital stay. If you have any questions about your discharge medications or the care you received while you were in the hospital after you are discharged, you can call the unit and asked to speak with the hospitalist on call if the hospitalist that took care of you is not available. Once you are discharged, your primary care physician will handle any further medical issues. Please note that NO REFILLS for any discharge medications will be authorized once you are discharged, as it is imperative that you return to your primary care physician (or establish a relationship with a primary care physician if you do not have one) for your aftercare needs so that they can reassess your need for medications and monitor your lab values. Today   SUBJECTIVE    No new complaints. Asking me "how long do I have before I die!!' VITAL SIGNS:  Blood pressure (!) 107/56, pulse (!) 108,  temperature 98.8 F (37.1 C), temperature source Oral, resp. rate 19, height 6' (1.829 m), weight 93.9 kg (207 lb 1.6 oz), SpO2 98 %.  I/O:   Intake/Output Summary (Last 24 hours) at 07/21/16 0858 Last data filed at 07/21/16 0759  Gross per 24 hour  Intake            758.7 ml  Output             2925 ml  Net          -2166.3 ml    PHYSICAL EXAMINATION:  GENERAL:  61 y.o.-year-old patient lying in the bed with no acute distress. anasarca EYES: Pupils equal, round, reactive to light and accommodation. No scleral icterus. Extraocular muscles intact.  HEENT: Head atraumatic, normocephalic. Oropharynx and nasopharynx clear.  NECK:  Supple, no jugular venous distention. No thyroid enlargement, no tenderness.  LUNGS: Normal breath sounds bilaterally, no wheezing, rales,rhonchi or crepitation. No use of accessory muscles of respiration.  CARDIOVASCULAR: S1, S2 normal. No murmurs, rubs, or gallops.  ABDOMEN: Soft, non-tender, distended++ chronic. Bowel sounds present. No organomegaly or mass.  EXTREMITIES:+++ pedal edema,no cyanosis, or clubbing.  NEUROLOGIC: Cranial nerves II through XII are intact. Muscle strength 4/5  in all extremities. Sensation intact. Gait not checked. ambulating PSYCHIATRIC: The patient is alert and oriented x 3.  SKIN: No obvious rash, lesion, or ulcer.   DATA REVIEW:   CBC   Recent Labs Lab 07/15/16 0557  WBC 17.1*  HGB 8.5*  HCT 26.6*  PLT 228    Chemistries   Recent Labs Lab 07/14/16 0924 07/15/16 0557  07/20/16 0733  NA 121* 121*  < > 125*  K 4.3 4.4  < > 4.7  CL 92* 92*  < > 91*  CO2 24 23  < > 27  GLUCOSE 150* 148*  < > 132*  BUN 16 18  < > 30*  CREATININE 0.96 0.88  < > 0.77  CALCIUM 7.5* 7.1*  < > 8.4*  MG 1.9  --   --   --   AST 119* 85*  --   --   ALT 25 21  --   --   ALKPHOS 121 94  --   --   BILITOT 5.8* 4.5*  --   --   < > = values in this interval not displayed.  Microbiology Results   Recent Results (from the past 240  hour(s))  Body fluid culture     Status: None   Collection Time: 07/14/16  1:07 PM  Result Value Ref Range Status   Specimen Description PERITONEAL  Final   Special Requests NONE  Final   Gram Stain   Final    MODERATE WBC PRESENT, PREDOMINANTLY PMN NO ORGANISMS SEEN    Culture   Final    NO GROWTH 3 DAYS Performed at [facility], [address], [place],  [address]    Report Status 07/18/2016 FINAL  Final  CULTURE, BLOOD (ROUTINE X 2) w Reflex to ID Panel     Status: None   Collection Time: 07/14/16  8:30 PM  Result Value Ref Range Status   Specimen Description BLOOD R HAND  Final   Special Requests Blood Culture adequate volume  Final   Culture NO GROWTH 5 DAYS  Final   Report Status 07/19/2016 FINAL  Final  CULTURE, BLOOD (ROUTINE X 2) w Reflex to ID Panel     Status: None   Collection Time: 07/14/16  8:36 PM  Result Value Ref Range Status   Specimen Description BLOOD L AC  Final   Special Requests Blood Culture adequate volume  Final   Culture NO GROWTH 5 DAYS  Final   Report Status 07/19/2016 FINAL  Final    RADIOLOGY:  No results found.   Management plans discussed with the patient, family and they are in agreement.  CODE STATUS:     Code Status Orders        Start     Ordered   07/14/16 1112  Full code  Continuous     07/14/16 1113    Code Status History    Date Active Date Inactive Code Status Order ID Comments User Context   06/20/2016  2:55 AM 06/24/2016  3:03 PM Full Code [id number]  Lance Coon, MD Inpatient   05/19/2016  9:16 PM 05/22/2016  3:03 PM Full Code [id number]  Fritzi Mandes, MD Inpatient      TOTAL TIME TAKING CARE OF THIS PATIENT: 40 minutes.    Shermon Bozzi M.D on 07/21/2016 at 8:58 AM  Between 7am to 6pm - Pager - [phone] After 6pm go to [email or web address] - Proofreader  [employer]  [phone]  CC: Primary care physician; Dion Body, MD

## 2016-07-28 NOTE — Progress Notes (Deleted)
[name]  [phone] [phone]  ID: Donald Bowers OB: Dec 14, 1955  MR#: [id number]  PVV#:[id number]  Patient Care Team: Dion Body, MD as PCP - General (Family Medicine) [name], RN as Registered Nurse  CHIEF COMPLAINT: Hepatocellular carcinoma.  INTERVAL HISTORY: Patient is a 61 year old male who was initially evaluated in the hospital admission for hemoptysis and GI bleed. He was noted to have a large liver mass highly suspicious for underlying hepatocellular carcinoma. Patient continues to feel weak and fatigued. He denies any further hemoptysis or bleeding. He has no neurologic complaints. He denies any recent fevers. He has a poor appetite, but denies weight loss. He has no chest pain or shortness of breath. He denies any nausea, vomiting, constipation, or diarrhea. He has no melena or hematochezia. He complains of abdominal bloating and lower extremity swelling. Patient offers no further specific complaints today.  REVIEW OF SYSTEMS:   Review of Systems  Constitutional: Positive for malaise/fatigue. Negative for fever and weight loss.  Respiratory: Negative.  Negative for cough and shortness of breath.   Cardiovascular: Positive for leg swelling. Negative for chest pain.  Gastrointestinal: Positive for abdominal pain. Negative for blood in stool, constipation, diarrhea, melena, nausea and vomiting.  Genitourinary: Negative.   Musculoskeletal: Negative.   Skin: Negative.  Negative for rash.  Neurological: Positive for weakness. Negative for sensory change.  Endo/Heme/Allergies: Negative.  Does not bruise/bleed easily.  Psychiatric/Behavioral: Positive for substance abuse. The patient is not nervous/anxious.     As per HPI. Otherwise, a complete review of systems is negative.  PAST MEDICAL HISTORY: Past Medical History:  Diagnosis Date  . Arthritis   . Cocaine abuse   . COPD (chronic obstructive pulmonary  disease) ([place])   . ETOH abuse   . GI bleed   . Hepatitis C   . Pneumonia     PAST SURGICAL HISTORY: Past Surgical History:  Procedure Laterality Date  . CARDIAC CATHETERIZATION    . ESOPHAGOGASTRODUODENOSCOPY (EGD) WITH PROPOFOL N/A 06/21/2016   Procedure: ESOPHAGOGASTRODUODENOSCOPY (EGD) WITH PROPOFOL;  Surgeon: Wilford Corner, MD;  Location: [facility] ENDOSCOPY;  Service: Endoscopy;  Laterality: N/A;    FAMILY HISTORY: Family History  Problem Relation Age of Onset  . COPD Mother   . Diabetes Sister   . Breast cancer Sister   . Cancer Father        back cancer  . Breast cancer Maternal Aunt   . Breast cancer Paternal Uncle     ADVANCED DIRECTIVES (Y/N):  N  HEALTH MAINTENANCE: Social History  Substance Use Topics  . Smoking status: Former Smoker    Packs/day: 0.50    Types: Cigarettes  . Smokeless tobacco: Never Used     Comment: pt said he has all the information he needs   . Alcohol use 7.2 oz/week    12 Cans of beer per week     Comment: weekly/daily, pt report drinking 2 can in 2weeks     Colonoscopy:  PAP:  Bone density:  Lipid panel:  No Known Allergies  Current Outpatient Prescriptions  Medication Sig Dispense Refill  . ciprofloxacin (CIPRO) 500 MG tablet Take 1 tab daily 30 tablet 1  . feeding supplement, ENSURE ENLIVE, (ENSURE ENLIVE) LIQD Take 237 mLs by mouth 3 (three) times daily between meals. 754 mL 12  . folic acid (FOLVITE) 1 MG tablet Take 1 tablet (1 mg total) by mouth daily. 30 tablet 0  . furosemide (LASIX) 40 MG tablet Take 1 tablet (40  mg total) by mouth daily. 60 tablet 1  . Multiple Vitamin (MULTIVITAMIN WITH MINERALS) TABS tablet Take 1 tablet by mouth daily. (Patient not taking: Reported on 07/14/2016) 30 tablet 0  . OxyCODONE HCl, Abuse Deter, (OXAYDO) 5 MG TABA Take 5 mg by mouth 2 (two) times daily as needed. 25 tablet 0  . pantoprazole (PROTONIX) 40 MG tablet Take 1 tablet (40 mg total) by mouth daily. 30 tablet 0  . spironolactone  (ALDACTONE) 25 MG tablet Take 1 tablet (25 mg total) by mouth daily. 30 tablet 1  . sucralfate (CARAFATE) 1 GM/10ML suspension Take 10 mLs (1 g total) by mouth 4 (four) times daily -  with meals and at bedtime. (Patient not taking: Reported on 07/14/2016) 420 mL 0  . traMADol (ULTRAM) 50 MG tablet Take 1 tablet (50 mg total) by mouth every 6 (six) hours as needed for moderate pain. 30 tablet 0   No current facility-administered medications for this visit.     OBJECTIVE: There were no vitals filed for this visit.   There is no height or weight on file to calculate BMI.    ECOG FS:2 - Symptomatic, <50% confined to bed  General: Well-developed, well-nourished, no acute distress. Eyes: Pink conjunctiva, icteric sclera. HEENT: Normocephalic, moist mucous membranes, clear oropharnyx. Lungs: Clear to auscultation bilaterally. Heart: Regular rate and rhythm. No rubs, murmurs, or gallops. Abdomen: Mildly distended, nontender. Musculoskeletal: No edema, cyanosis, or clubbing. Neuro: Alert, answering all questions appropriately. Cranial nerves grossly intact. Skin: No rashes or petechiae noted. Jaundiced.  Psych: Normal affect. Lymphatics: No cervical, calvicular, axillary or inguinal LAD.   LAB RESULTS:  Lab Results  Component Value Date   NA 125 (L) 07/20/2016   K 4.7 07/20/2016   CL 91 (L) 07/20/2016   CO2 27 07/20/2016   GLUCOSE 132 (H) 07/20/2016   BUN 30 (H) 07/20/2016   CREATININE 0.77 07/20/2016   CALCIUM 8.4 (L) 07/20/2016   PROT 6.2 (L) 07/15/2016   ALBUMIN 1.4 (L) 07/15/2016   AST 85 (H) 07/15/2016   ALT 21 07/15/2016   ALKPHOS 94 07/15/2016   BILITOT 4.5 (H) 07/15/2016   GFRNONAA >60 07/20/2016   GFRAA >60 07/20/2016    Lab Results  Component Value Date   WBC 17.1 (H) 07/15/2016   NEUTROABS 8.3 (H) 07/02/2016   HGB 8.5 (L) 07/15/2016   HCT 26.6 (L) 07/15/2016   MCV 83.4 07/15/2016   PLT 228 07/15/2016     STUDIES: Dg Chest 1 View  Result Date:  07/14/2016 CLINICAL DATA:  Increasing shortness of breath, increasing ascites. Recently diagnosis with malignancy of the liver and has not begun treatment. EXAM: CHEST 1 VIEW COMPARISON:  Portable chest x-ray of June 22, 2016 FINDINGS: The lungs are less well inflated today. The interstitial markings remain increased. The cardiac silhouette is enlarged. The central pulmonary vascularity is prominent. There is calcification in the wall of the aortic arch. The bony thorax exhibits no acute abnormality. IMPRESSION: Mild pulmonary interstitial edema accentuated by mild hypoinflation. Mild cardiomegaly. No acute pneumonia. Thoracic aortic atherosclerosis. Electronically Signed   By: David  Martinique M.D.   On: 07/14/2016 09:51   US Abdomen Limited  Result Date: 07/18/2016 CLINICAL DATA:  61 year old male with cirrhosis, hepatocellular carcinoma and recurrent non malignant ascites. Most recent paracentesis performed 07/14/2016. Evaluate for recurrent fluid. EXAM: US ABDOMEN LIMITED - RIGHT UPPER QUADRANT COMPARISON:  Ultrasound paracentesis 07/14/2016 FINDINGS: Sonographic interrogation of the abdomen demonstrates a small to moderate volume of ascites. Morphologic changes  of hepatic cirrhosis and splenomegaly consistent with portal hypertension. IMPRESSION: Recurrent ascites, currently small to moderate in volume. Electronically Signed   By: Jacqulynn Cadet M.D.   On: 07/18/2016 08:40   US Biopsy  Result Date: 07/10/2016 CLINICAL DATA:  Cirrhosis, hepatitis-C and liver masses in right and left lobes. Ascites present by prior imaging. EXAM: 1. ULTRASOUND-GUIDED PARACENTESIS 2. ULTRASOUND GUIDED CORE BIOPSY OF LIVER MASS MEDICATIONS: 1.0 mg IV Versed; 50 mcg IV Fentanyl Total Moderate Sedation Time: 19 minutes. The patient's level of consciousness and physiologic status were continuously monitored during the procedure by Radiology nursing. PROCEDURE: The procedure, risks, benefits, and alternatives were explained to  the patient. Questions regarding the procedure were encouraged and answered. The patient understands and consents to the procedure. A time out was performed prior to initiating the procedure. The abdominal wall was prepped with chlorhexidine in a sterile fashion, and a sterile drape was applied covering the operative field. A sterile gown and sterile gloves were used for the procedure. Local anesthesia was provided with 1% Lidocaine. Ultrasound was performed of the abdomen. Initial paracentesis was performed to reduce bleeding risk of liver biopsy. After localizing ascites in the right lower quadrant, a 6 French Safe-T-Centesis catheter was inserted into the peritoneal cavity. Paracentesis was performed utilizing vacuum bottles. A fluid sample was sent for cytologic analysis. Mass lesion within the right lobe of the liver was localized by ultrasound. Under direct ultrasound guidance, a 17 gauge needle was advanced into the right lobe of the liver. Two separate coaxial 18 gauge core biopsy samples were obtained and submitted in formalin. As the outer needle was retracted, a Gel-Foam slurry was injected. COMPLICATIONS: None. FINDINGS: Moderate ascites is present in the peritoneal cavity, increased in volume since prior ultrasound imaging on 06/22/2016. Fluid is seen surrounding the liver. In order to decrease bleeding risk during liver biopsy, paracentesis was performed prior to planned liver biopsy. Clear, yellowish fluid was obtained during paracentesis. Ultrasound shows minimal fluid remain after paracentesis. Infiltrative, ill-defined heterogeneous mass is seen throughout much of the right lobe of the liver. Solid tissue was obtained. IMPRESSION: Ultrasound-guided paracentesis and liver biopsy. Paracentesis yielded 2.2 L of fluid with a sample sent for cytologic analysis. Core biopsy of the liver was performed at the level of the large heterogeneous mass occupying much of the right lobe of the liver.  Electronically Signed   By: Aletta Edouard M.D.   On: 07/10/2016 10:23   US Paracentesis  Result Date: 07/14/2016 INDICATION: Abdominal distension, hepatitis-C, cirrhosis, hepatic mass EXAM: ULTRASOUND GUIDED RIGHT PARACENTESIS MEDICATIONS: 1% LIDOCAINE LOCALLY COMPLICATIONS: None immediate. PROCEDURE: Informed written consent was obtained from the patient after a discussion of the risks, benefits and alternatives to treatment. A timeout was performed prior to the initiation of the procedure. Initial ultrasound scanning demonstrates a large amount of ascites within the right lower abdominal quadrant. The right lower abdomen was prepped and draped in the usual sterile fashion. 1% lidocaine with epinephrine was used for local anesthesia. Following this, a 6 Fr Safe-T-Centesis catheter was introduced. An ultrasound image was saved for documentation purposes. The paracentesis was performed. The catheter was removed and a dressing was applied. The patient tolerated the procedure well without immediate post procedural complication. FINDINGS: A total of approximately 3.3 L of CLEAR PERITONEAL fluid was removed. Samples were sent to the laboratory as requested by the clinical team. IMPRESSION: Successful ultrasound-guided paracentesis yielding 3.3 liters of peritoneal fluid. Electronically Signed   By: Jerilynn Mages.  Shick M.D.  On: 07/14/2016 14:08   US Paracentesis  Result Date: 07/10/2016 CLINICAL DATA:  Cirrhosis, hepatitis-C and liver masses in right and left lobes. Ascites present by prior imaging. EXAM: 1. ULTRASOUND-GUIDED PARACENTESIS 2. ULTRASOUND GUIDED CORE BIOPSY OF LIVER MASS MEDICATIONS: 1.0 mg IV Versed; 50 mcg IV Fentanyl Total Moderate Sedation Time: 19 minutes. The patient's level of consciousness and physiologic status were continuously monitored during the procedure by Radiology nursing. PROCEDURE: The procedure, risks, benefits, and alternatives were explained to the patient. Questions regarding the  procedure were encouraged and answered. The patient understands and consents to the procedure. A time out was performed prior to initiating the procedure. The abdominal wall was prepped with chlorhexidine in a sterile fashion, and a sterile drape was applied covering the operative field. A sterile gown and sterile gloves were used for the procedure. Local anesthesia was provided with 1% Lidocaine. Ultrasound was performed of the abdomen. Initial paracentesis was performed to reduce bleeding risk of liver biopsy. After localizing ascites in the right lower quadrant, a 6 French Safe-T-Centesis catheter was inserted into the peritoneal cavity. Paracentesis was performed utilizing vacuum bottles. A fluid sample was sent for cytologic analysis. Mass lesion within the right lobe of the liver was localized by ultrasound. Under direct ultrasound guidance, a 17 gauge needle was advanced into the right lobe of the liver. Two separate coaxial 18 gauge core biopsy samples were obtained and submitted in formalin. As the outer needle was retracted, a Gel-Foam slurry was injected. COMPLICATIONS: None. FINDINGS: Moderate ascites is present in the peritoneal cavity, increased in volume since prior ultrasound imaging on 06/22/2016. Fluid is seen surrounding the liver. In order to decrease bleeding risk during liver biopsy, paracentesis was performed prior to planned liver biopsy. Clear, yellowish fluid was obtained during paracentesis. Ultrasound shows minimal fluid remain after paracentesis. Infiltrative, ill-defined heterogeneous mass is seen throughout much of the right lobe of the liver. Solid tissue was obtained. IMPRESSION: Ultrasound-guided paracentesis and liver biopsy. Paracentesis yielded 2.2 L of fluid with a sample sent for cytologic analysis. Core biopsy of the liver was performed at the level of the large heterogeneous mass occupying much of the right lobe of the liver. Electronically Signed   By: Aletta Edouard M.D.    On: 07/10/2016 10:23    ASSESSMENT:  Hepatocellular carcinoma  PLAN:    1.  Hepatocellular carcinoma: Highly suspicious for underlying hepatocellular carcinoma given his history of alcoholism and hepatitis C. Patient's AFP is also greater than the 170,000. Ultrasound-guided liver biopsy has been scheduled to confirm the diagnosis. Given patient's poor performance status as well as elevated bilirubin, treatment may be difficult. Return to clinic several days after his biopsy to discuss the results and treatment planning if possible. 2. Portal vein thrombosis: Given patient's recent GI bleed cannot anticoagulate. Monitor. 3. GI bleed: Likely secondary to varices. Continue follow-up with GI as indicated. 4. Anemia: Secondary to GI bleed, monitor. 5. Hyperbilirubinemia: Secondary to poor liver function and underlying tonsillar carcinoma. Monitor. 6. Abdominal bloating/edema: Patient was given a prescription for Lasix. 7. Pain: Patient was given a prescription for oxycodone.  Patient expressed understanding and was in agreement with this plan. He also understands that He can call clinic at any time with any questions, concerns, or complaints.   Cancer Staging No matching staging information was found for the patient.  Lloyd Huger, MD   07/28/2016 11:25 PM

## 2016-07-29 ENCOUNTER — Other Ambulatory Visit: Payer: Self-pay | Admitting: *Deleted

## 2016-07-29 ENCOUNTER — Inpatient Hospital Stay: Payer: 59 | Admitting: Oncology

## 2016-07-29 MED ORDER — OXYCODONE HCL 5 MG PO TABS: 5.0000 mg | ORAL_TABLET | Freq: Two times a day (BID) | ORAL | 0 refills | Status: DC | PRN

## 2016-07-29 MED ORDER — PANTOPRAZOLE SODIUM 40 MG PO TBEC: 40.0000 mg | DELAYED_RELEASE_TABLET | Freq: Every day | ORAL | 2 refills | Status: DC

## 2016-08-01 ENCOUNTER — Inpatient Hospital Stay: Payer: 59 | Admitting: Oncology

## 2016-08-03 ENCOUNTER — Emergency Department: Payer: 59

## 2016-08-03 ENCOUNTER — Inpatient Hospital Stay: Payer: 59

## 2016-08-03 ENCOUNTER — Encounter: Payer: Self-pay | Admitting: Emergency Medicine

## 2016-08-03 ENCOUNTER — Inpatient Hospital Stay
Admission: EM | Admit: 2016-08-03 | Discharge: 2016-08-07 | DRG: 871 | Payer: 59 | Attending: Internal Medicine | Admitting: Internal Medicine

## 2016-08-03 DIAGNOSIS — Z79899 Other long term (current) drug therapy: Secondary | ICD-10-CM

## 2016-08-03 DIAGNOSIS — K117 Disturbances of salivary secretion: Secondary | ICD-10-CM | POA: Diagnosis not present

## 2016-08-03 DIAGNOSIS — E871 Hypo-osmolality and hyponatremia: Secondary | ICD-10-CM | POA: Diagnosis present

## 2016-08-03 DIAGNOSIS — Z515 Encounter for palliative care: Secondary | ICD-10-CM

## 2016-08-03 DIAGNOSIS — J440 Chronic obstructive pulmonary disease with acute lower respiratory infection: Secondary | ICD-10-CM | POA: Diagnosis present

## 2016-08-03 DIAGNOSIS — B1920 Unspecified viral hepatitis C without hepatic coma: Secondary | ICD-10-CM | POA: Diagnosis present

## 2016-08-03 DIAGNOSIS — E873 Alkalosis: Secondary | ICD-10-CM | POA: Diagnosis present

## 2016-08-03 DIAGNOSIS — J189 Pneumonia, unspecified organism: Secondary | ICD-10-CM | POA: Diagnosis present

## 2016-08-03 DIAGNOSIS — J9600 Acute respiratory failure, unspecified whether with hypoxia or hypercapnia: Secondary | ICD-10-CM | POA: Diagnosis not present

## 2016-08-03 DIAGNOSIS — Z7189 Other specified counseling: Secondary | ICD-10-CM | POA: Diagnosis not present

## 2016-08-03 DIAGNOSIS — Z66 Do not resuscitate: Secondary | ICD-10-CM

## 2016-08-03 DIAGNOSIS — R0602 Shortness of breath: Secondary | ICD-10-CM

## 2016-08-03 DIAGNOSIS — Z87891 Personal history of nicotine dependence: Secondary | ICD-10-CM | POA: Diagnosis not present

## 2016-08-03 DIAGNOSIS — R609 Edema, unspecified: Secondary | ICD-10-CM | POA: Diagnosis present

## 2016-08-03 DIAGNOSIS — F1410 Cocaine abuse, uncomplicated: Secondary | ICD-10-CM | POA: Diagnosis present

## 2016-08-03 DIAGNOSIS — C220 Liver cell carcinoma: Secondary | ICD-10-CM | POA: Diagnosis present

## 2016-08-03 DIAGNOSIS — R188 Other ascites: Secondary | ICD-10-CM | POA: Diagnosis not present

## 2016-08-03 DIAGNOSIS — R0600 Dyspnea, unspecified: Secondary | ICD-10-CM

## 2016-08-03 DIAGNOSIS — K7460 Unspecified cirrhosis of liver: Secondary | ICD-10-CM | POA: Diagnosis present

## 2016-08-03 DIAGNOSIS — A419 Sepsis, unspecified organism: Principal | ICD-10-CM | POA: Diagnosis present

## 2016-08-03 DIAGNOSIS — K7031 Alcoholic cirrhosis of liver with ascites: Secondary | ICD-10-CM | POA: Diagnosis not present

## 2016-08-03 DIAGNOSIS — J181 Lobar pneumonia, unspecified organism: Secondary | ICD-10-CM

## 2016-08-03 HISTORY — DX: Malignant (primary) neoplasm, unspecified: C80.1

## 2016-08-03 LAB — COMPREHENSIVE METABOLIC PANEL
ALK PHOS: 98 U/L (ref 38–126)
ALT: 18 U/L (ref 17–63)
ALT: 16 U/L — ABNORMAL LOW (ref 17–63)
ANION GAP: 5 (ref 5–15)
AST: 55 U/L — AB (ref 15–41)
AST: 46 U/L — ABNORMAL HIGH (ref 15–41)
Albumin: 2.4 g/dL — ABNORMAL LOW (ref 3.5–5.0)
Albumin: 2.1 g/dL — ABNORMAL LOW (ref 3.5–5.0)
Alkaline Phosphatase: 82 U/L (ref 38–126)
Anion gap: 8 (ref 5–15)
BILIRUBIN TOTAL: 5.9 mg/dL — AB (ref 0.3–1.2)
BILIRUBIN TOTAL: 5.6 mg/dL — AB (ref 0.3–1.2)
BUN: 17 mg/dL (ref 6–20)
BUN: 18 mg/dL (ref 6–20)
CALCIUM: 8.7 mg/dL — AB (ref 8.9–10.3)
CHLORIDE: 101 mmol/L (ref 101–111)
CO2: 23 mmol/L (ref 22–32)
CO2: 25 mmol/L (ref 22–32)
CREATININE: 0.72 mg/dL (ref 0.61–1.24)
Calcium: 8.2 mg/dL — ABNORMAL LOW (ref 8.9–10.3)
Chloride: 103 mmol/L (ref 101–111)
Creatinine, Ser: 0.87 mg/dL (ref 0.61–1.24)
GFR calc Af Amer: >60 mL/min (ref >60)
Glucose, Bld: 135 mg/dL — ABNORMAL HIGH (ref 65–99)
Glucose, Bld: 152 mg/dL — ABNORMAL HIGH (ref 65–99)
POTASSIUM: 4.3 mmol/L (ref 3.5–5.1)
Potassium: 4.6 mmol/L (ref 3.5–5.1)
Sodium: 132 mmol/L — ABNORMAL LOW (ref 135–145)
Sodium: 133 mmol/L — ABNORMAL LOW (ref 135–145)
TOTAL PROTEIN: 7.6 g/dL (ref 6.5–8.1)
Total Protein: 8.6 g/dL — ABNORMAL HIGH (ref 6.5–8.1)

## 2016-08-03 LAB — AMMONIA: AMMONIA: 43 umol/L — AB (ref 9–35)

## 2016-08-03 LAB — BLOOD GAS, ARTERIAL
ACID-BASE DEFICIT: 1.5 mmol/L (ref 0.0–2.0)
Bicarbonate: 21.3 mmol/L (ref 20.0–28.0)
FIO2: 0.28
O2 SAT: 93.8 %
PCO2 ART: 28 mmHg — AB (ref 32.0–48.0)
Patient temperature: 37
pH, Arterial: 7.49 — ABNORMAL HIGH (ref 7.350–7.450)
pO2, Arterial: 64 mmHg — ABNORMAL LOW (ref 83.0–108.0)

## 2016-08-03 LAB — LIPASE, BLOOD: Lipase: 44 U/L (ref 11–51)

## 2016-08-03 LAB — URINALYSIS, COMPLETE (UACMP) WITH MICROSCOPIC
Bilirubin Urine: NEGATIVE
Glucose, UA: NEGATIVE mg/dL
Ketones, ur: NEGATIVE mg/dL
Nitrite: NEGATIVE
PROTEIN: NEGATIVE mg/dL
Specific Gravity, Urine: 1.015 (ref 1.005–1.030)
pH: 6 (ref 5.0–8.0)

## 2016-08-03 LAB — CBC WITH DIFFERENTIAL/PLATELET
Basophils Absolute: 0 10*3/uL (ref 0–0.1)
Basophils Relative: 0 %
Eosinophils Absolute: 0.1 10*3/uL (ref 0–0.7)
Eosinophils Relative: 1 %
HEMATOCRIT: 28.4 % — AB (ref 40.0–52.0)
HEMOGLOBIN: 8.9 g/dL — AB (ref 13.0–18.0)
LYMPHS ABS: 0.7 10*3/uL — AB (ref 1.0–3.6)
Lymphocytes Relative: 7 %
MCH: 26.2 pg (ref 26.0–34.0)
MCHC: 31.2 g/dL — AB (ref 32.0–36.0)
MCV: 83.9 fL (ref 80.0–100.0)
MONOS PCT: 4 %
Monocytes Absolute: 0.4 10*3/uL (ref 0.2–1.0)
NEUTROS ABS: 8.5 10*3/uL — AB (ref 1.4–6.5)
NEUTROS PCT: 88 %
Platelets: 214 10*3/uL (ref 150–440)
RBC: 3.39 MIL/uL — ABNORMAL LOW (ref 4.40–5.90)
RDW: 22.1 % — ABNORMAL HIGH (ref 11.5–14.5)
WBC: 9.7 10*3/uL (ref 3.8–10.6)

## 2016-08-03 LAB — BODY FLUID CELL COUNT WITH DIFFERENTIAL
Eos, Fluid: 1 %
Lymphs, Fluid: 53 %
Monocyte-Macrophage-Serous Fluid: 19 %
Neutrophil Count, Fluid: 27 %
Total Nucleated Cell Count, Fluid: 404 cu mm

## 2016-08-03 LAB — BRAIN NATRIURETIC PEPTIDE: B Natriuretic Peptide: 148 pg/mL — ABNORMAL HIGH (ref 0.0–100.0)

## 2016-08-03 LAB — PROTIME-INR
INR: 1.95
Prothrombin Time: 22.5 seconds — ABNORMAL HIGH (ref 11.4–15.2)

## 2016-08-03 LAB — LACTIC ACID, PLASMA
LACTIC ACID, VENOUS: 4.6 mmol/L — AB (ref 0.5–1.9)
Lactic Acid, Venous: 3.1 mmol/L (ref 0.5–1.9)

## 2016-08-03 LAB — PROTEIN, PLEURAL OR PERITONEAL FLUID: Total protein, fluid: <3 g/dL

## 2016-08-03 LAB — APTT: aPTT: 37 seconds — ABNORMAL HIGH (ref 24–36)

## 2016-08-03 LAB — GLUCOSE, PLEURAL OR PERITONEAL FLUID: Glucose, Fluid: 141 mg/dL

## 2016-08-03 LAB — TROPONIN I: Troponin I: <0.03 ng/mL (ref <0.03)

## 2016-08-03 LAB — TSH: TSH: 1.191 u[IU]/mL (ref 0.350–4.500)

## 2016-08-03 MED ORDER — FUROSEMIDE 10 MG/ML IJ SOLN
40.0000 mg | Freq: Once | INTRAMUSCULAR | Status: AC
  Administered 2016-08-03: 40 mg via INTRAVENOUS
  Filled 2016-08-03: qty 4

## 2016-08-03 MED ORDER — SODIUM CHLORIDE 0.9 % IV SOLN
Freq: Once | INTRAVENOUS | Status: AC
  Administered 2016-08-03: 06:00:00 via INTRAVENOUS

## 2016-08-03 MED ORDER — FUROSEMIDE 40 MG PO TABS
40.0000 mg | ORAL_TABLET | Freq: Every day | ORAL | Status: DC
  Administered 2016-08-03 – 2016-08-06 (×4): 40 mg via ORAL
  Filled 2016-08-03 (×4): qty 1

## 2016-08-03 MED ORDER — CEFTRIAXONE SODIUM 1 G IJ SOLR: 2.0000 g | Freq: Three times a day (TID) | INTRAMUSCULAR | Status: DC

## 2016-08-03 MED ORDER — TRAMADOL HCL 50 MG PO TABS
50.0000 mg | ORAL_TABLET | Freq: Four times a day (QID) | ORAL | Status: DC | PRN
  Administered 2016-08-06 (×2): 50 mg via ORAL
  Filled 2016-08-03 (×2): qty 1

## 2016-08-03 MED ORDER — ADULT MULTIVITAMIN W/MINERALS CH
1.0000 | ORAL_TABLET | Freq: Every day | ORAL | Status: DC
  Administered 2016-08-03 – 2016-08-06 (×4): 1 via ORAL
  Filled 2016-08-03 (×4): qty 1

## 2016-08-03 MED ORDER — DOCUSATE SODIUM 100 MG PO CAPS
100.0000 mg | ORAL_CAPSULE | Freq: Two times a day (BID) | ORAL | Status: DC
  Administered 2016-08-03 – 2016-08-06 (×7): 100 mg via ORAL
  Filled 2016-08-03 (×8): qty 1

## 2016-08-03 MED ORDER — SODIUM CHLORIDE 0.9 % IV SOLN
Freq: Once | INTRAVENOUS | Status: AC
  Administered 2016-08-03: 05:00:00 via INTRAVENOUS

## 2016-08-03 MED ORDER — VANCOMYCIN HCL IN DEXTROSE 1-5 GM/200ML-% IV SOLN
1000.0000 mg | Freq: Once | INTRAVENOUS | Status: AC
  Administered 2016-08-03: 1000 mg via INTRAVENOUS
  Filled 2016-08-03: qty 200

## 2016-08-03 MED ORDER — ORAL CARE MOUTH RINSE
15.0000 mL | Freq: Two times a day (BID) | OROMUCOSAL | Status: DC
  Administered 2016-08-03 – 2016-08-07 (×9): 15 mL via OROMUCOSAL

## 2016-08-03 MED ORDER — SPIRONOLACTONE 25 MG PO TABS
25.0000 mg | ORAL_TABLET | Freq: Every day | ORAL | Status: DC
  Administered 2016-08-03 – 2016-08-06 (×4): 25 mg via ORAL
  Filled 2016-08-03 (×4): qty 1

## 2016-08-03 MED ORDER — ACETAMINOPHEN 325 MG PO TABS: 650.0000 mg | ORAL_TABLET | Freq: Four times a day (QID) | ORAL | Status: DC | PRN

## 2016-08-03 MED ORDER — PIPERACILLIN-TAZOBACTAM 3.375 G IVPB 30 MIN
3.3750 g | Freq: Once | INTRAVENOUS | Status: AC
  Administered 2016-08-03: 3.375 g via INTRAVENOUS
  Filled 2016-08-03: qty 50

## 2016-08-03 MED ORDER — ONDANSETRON HCL 4 MG/2ML IJ SOLN
4.0000 mg | Freq: Four times a day (QID) | INTRAMUSCULAR | Status: DC | PRN
  Administered 2016-08-04: 18:00:00 4 mg via INTRAVENOUS
  Filled 2016-08-03: qty 2

## 2016-08-03 MED ORDER — ENSURE ENLIVE PO LIQD
237.0000 mL | Freq: Three times a day (TID) | ORAL | Status: DC
  Administered 2016-08-03 – 2016-08-06 (×8): 237 mL via ORAL

## 2016-08-03 MED ORDER — FOLIC ACID 1 MG PO TABS
1.0000 mg | ORAL_TABLET | Freq: Every day | ORAL | Status: DC
  Administered 2016-08-03 – 2016-08-06 (×4): 1 mg via ORAL
  Filled 2016-08-03 (×4): qty 1

## 2016-08-03 MED ORDER — ONDANSETRON HCL 4 MG PO TABS: 4.0000 mg | ORAL_TABLET | Freq: Four times a day (QID) | ORAL | Status: DC | PRN

## 2016-08-03 MED ORDER — SUCRALFATE 1 GM/10ML PO SUSP
1.0000 g | Freq: Three times a day (TID) | ORAL | Status: DC
  Administered 2016-08-03 – 2016-08-06 (×12): 1 g via ORAL
  Filled 2016-08-03 (×12): qty 10

## 2016-08-03 MED ORDER — PANTOPRAZOLE SODIUM 40 MG PO TBEC
40.0000 mg | DELAYED_RELEASE_TABLET | Freq: Every day | ORAL | Status: DC
  Administered 2016-08-03 – 2016-08-06 (×4): 40 mg via ORAL
  Filled 2016-08-03 (×4): qty 1

## 2016-08-03 MED ORDER — SODIUM CHLORIDE 0.9 % IV SOLN
INTRAVENOUS | Status: DC
  Administered 2016-08-03 – 2016-08-05 (×5): via INTRAVENOUS

## 2016-08-03 MED ORDER — CEFTRIAXONE SODIUM 1 G IJ SOLR: 2.0000 g | INTRAMUSCULAR | Status: DC

## 2016-08-03 MED ORDER — MORPHINE SULFATE (PF) 2 MG/ML IV SOLN
2.0000 mg | INTRAVENOUS | Status: DC | PRN
  Administered 2016-08-03: 09:00:00 2 mg via INTRAVENOUS
  Administered 2016-08-03 – 2016-08-04 (×3): 4 mg via INTRAVENOUS
  Filled 2016-08-03: qty 2
  Filled 2016-08-03: qty 1
  Filled 2016-08-03 (×2): qty 2

## 2016-08-03 MED ORDER — ACETAMINOPHEN 650 MG RE SUPP: 650.0000 mg | Freq: Four times a day (QID) | RECTAL | Status: DC | PRN

## 2016-08-03 MED ORDER — DEXTROSE 5 % IV SOLN
1.0000 g | INTRAVENOUS | Status: DC
  Administered 2016-08-03: 1 g via INTRAVENOUS
  Filled 2016-08-03 (×2): qty 10

## 2016-08-03 MED ORDER — MORPHINE SULFATE (PF) 4 MG/ML IV SOLN
4.0000 mg | Freq: Once | INTRAVENOUS | Status: AC
  Administered 2016-08-03: 4 mg via INTRAVENOUS
  Filled 2016-08-03: qty 1

## 2016-08-03 MED ORDER — CIPROFLOXACIN HCL 500 MG PO TABS
500.0000 mg | ORAL_TABLET | Freq: Every day | ORAL | Status: DC
  Administered 2016-08-03 – 2016-08-04 (×2): 500 mg via ORAL
  Filled 2016-08-03 (×2): qty 1

## 2016-08-03 MED ORDER — OXYCODONE HCL 5 MG PO TABS
5.0000 mg | ORAL_TABLET | Freq: Two times a day (BID) | ORAL | Status: DC | PRN
  Administered 2016-08-03 – 2016-08-06 (×3): 5 mg via ORAL
  Filled 2016-08-03 (×3): qty 1

## 2016-08-03 NOTE — ED Provider Notes (Signed)
[facility] Emergency Department Provider Note   ____________________________________________   First MD Initiated Contact with Patient 08/03/16 0310     (approximate)  I have reviewed the triage vital signs and the nursing notes.   HISTORY  Chief Complaint Shortness of Breath; Abdominal Pain; and Leg Swelling    HPI Donald Bowers is a 61 y.o. male who comes in from home to Make and tachycardic complaint of aching everywhere from his chest down. He says he feels bad to be short of breath is now coughing he feels warm but his temperature is only 99. He says he feels bad now he seems to have gotten sicker today. Has a history of liver cancer   Past Medical History:  Diagnosis Date  . Arthritis   . Cancer ([place])    liver  . Cocaine abuse   . COPD (chronic obstructive pulmonary disease) ([place])   . ETOH abuse   . GI bleed   . Hepatitis C   . Pneumonia     Patient Active Problem List   Diagnosis Date Noted  . Sepsis ([place]) 08/03/2016  . Alcoholic cirrhosis of liver with ascites ([place])   . Hepatocellular carcinoma ([place])   . Palliative care by specialist   . Goals of care, counseling/discussion   . SBP (spontaneous bacterial peritonitis) ([place])   . Ascitic fluid 07/14/2016  . Liver mass   . Hematemesis 06/19/2016  . Melena 06/19/2016  . COPD (chronic obstructive pulmonary disease) ([place]) 06/19/2016  . Hepatitis C 06/19/2016  . Acute hyponatremia 05/19/2016    Past Surgical History:  Procedure Laterality Date  . CARDIAC CATHETERIZATION    . ESOPHAGOGASTRODUODENOSCOPY (EGD) WITH PROPOFOL N/A 06/21/2016   Procedure: ESOPHAGOGASTRODUODENOSCOPY (EGD) WITH PROPOFOL;  Surgeon: Wilford Corner, MD;  Location: [facility] ENDOSCOPY;  Service: Endoscopy;  Laterality: N/A;    Prior to Admission medications   Medication Sig Start Date End Date Taking? Authorizing Provider  ciprofloxacin (CIPRO) 500 MG tablet Take 1 tab daily Patient taking differently:  Take 500 mg by mouth daily with breakfast.  07/21/16  Yes Fritzi Mandes, MD  feeding supplement, ENSURE ENLIVE, (ENSURE ENLIVE) LIQD Take 237 mLs by mouth 3 (three) times daily between meals. 07/18/16  Yes Fritzi Mandes, MD  folic acid (FOLVITE) 1 MG tablet Take 1 tablet (1 mg total) by mouth daily. 06/24/16  Yes Fritzi Mandes, MD  furosemide (LASIX) 40 MG tablet Take 1 tablet (40 mg total) by mouth daily. 07/21/16  Yes Fritzi Mandes, MD  Multiple Vitamin (MULTIVITAMIN WITH MINERALS) TABS tablet Take 1 tablet by mouth daily. 06/24/16  Yes Fritzi Mandes, MD  oxyCODONE (OXY IR/ROXICODONE) 5 MG immediate release tablet Take 1 tablet (5 mg total) by mouth 2 (two) times daily as needed for severe pain. 07/29/16  Yes Lloyd Huger, MD  pantoprazole (PROTONIX) 40 MG tablet Take 1 tablet (40 mg total) by mouth daily. 07/29/16  Yes Lloyd Huger, MD  spironolactone (ALDACTONE) 25 MG tablet Take 1 tablet (25 mg total) by mouth daily. 07/19/16  Yes Fritzi Mandes, MD  sucralfate (CARAFATE) 1 GM/10ML suspension Take 10 mLs (1 g total) by mouth 4 (four) times daily -  with meals and at bedtime. 06/24/16  Yes Fritzi Mandes, MD  traMADol (ULTRAM) 50 MG tablet Take 1 tablet (50 mg total) by mouth every 6 (six) hours as needed for moderate pain. 07/09/16  Yes Lloyd Huger, MD    Allergies Patient has no known allergies.  Family History  Problem  Relation Age of Onset  . COPD Mother   . Diabetes Sister   . Breast cancer Sister   . Cancer Father        back cancer  . Breast cancer Maternal Aunt   . Breast cancer Paternal Uncle     Social History Social History  Substance Use Topics  . Smoking status: Former Smoker    Packs/day: 0.50    Types: Cigarettes  . Smokeless tobacco: Never Used     Comment: pt said he has all the information he needs   . Alcohol use No     Comment: weekly/daily, pt report drinking 2 can in 2weeks    Review of Systems  Constitutional: No fever/chills Eyes: No visual  changes. ENT: No sore throat. Cardiovascular:chest pain. Respiratory:  shortness of breath. Gastrointestinal: abdominal pain.nausea, no vomiting.  No diarrhea.  No constipation. Genitourinary: Negative for dysuria. Musculoskeletal: Negative for back pain. Skin: Negative for rash. Neurological: Negative for headaches, focal weakness   ____________________________________________   PHYSICAL EXAM:  VITAL SIGNS: ED Triage Vitals  Enc Vitals Group     BP 08/03/16 0308 138/74     Pulse Rate 08/03/16 0308 (!) 134     Resp 08/03/16 0308 (!) 28     Temp 08/03/16 0308 99.3 F (37.4 C)     Temp src --      SpO2 08/03/16 0308 93 %     Weight 08/03/16 0303 207 lb (93.9 kg)     Height 08/03/16 0303 6' (1.829 m)     Head Circumference --      Peak Flow --      Pain Score --      Pain Loc --      Pain Edu? --    Constitutional: Alert and oriented. ill appearing and inacute distress. Eyes: Conjunctivae are normal. PERRL. EOMI. Head: Atraumatic. Nose: No congestion/rhinnorhea. Mouth/Throat: Mucous membranes are moist.  Oropharynx non-erythematous. Neck: No stridor.   Cardiovascular: Rapid rate, regular rhythm. Grossly normal heart sounds.  Good peripheral circulation. Respiratory: Increased respiratory effort. retractions. Lungs scattered crackles Gastrointestinal: Soft and nontender.  distention. No abdominal bruits. No CVA tenderness. Musculoskeletal: No lower extremity tenderness nor edema.  No joint effusions. Neurologic:  Normal speech and language. No gross focal neurologic deficits are appreciated. No gait instability. Skin:  Skin is warm, dry and intact. No rash noted.   ____________________________________________   LABS (all labs ordered are listed, but only abnormal results are displayed)  Labs Reviewed  COMPREHENSIVE METABOLIC PANEL - Abnormal; Notable for the following:       Result Value   Sodium 132 (*)    Glucose, Bld 135 (*)    Calcium 8.7 (*)    Total Protein  8.6 (*)    Albumin 2.4 (*)    AST 55 (*)    Total Bilirubin 5.9 (*)    All other components within normal limits  LACTIC ACID, PLASMA - Abnormal; Notable for the following:    Lactic Acid, Venous 4.6 (*)    All other components within normal limits  LACTIC ACID, PLASMA - Abnormal; Notable for the following:    Lactic Acid, Venous 3.1 (*)    All other components within normal limits  BRAIN NATRIURETIC PEPTIDE - Abnormal; Notable for the following:    B Natriuretic Peptide 148.0 (*)    All other components within normal limits  CBC WITH DIFFERENTIAL/PLATELET - Abnormal; Notable for the following:    RBC 3.39 (*)  Hemoglobin 8.9 (*)    HCT 28.4 (*)    MCHC 31.2 (*)    RDW 22.1 (*)    Neutro Abs 8.5 (*)    Lymphs Abs 0.7 (*)    All other components within normal limits  URINALYSIS, COMPLETE (UACMP) WITH MICROSCOPIC - Abnormal; Notable for the following:    Color, Urine AMBER (*)    APPearance CLEAR (*)    Hgb urine dipstick MODERATE (*)    Leukocytes, UA SMALL (*)    Bacteria, UA RARE (*)    Squamous Epithelial / LPF 0-5 (*)    All other components within normal limits  AMMONIA - Abnormal; Notable for the following:    Ammonia 43 (*)    All other components within normal limits  BLOOD GAS, ARTERIAL - Abnormal; Notable for the following:    pH, Arterial 7.49 (*)    pCO2 arterial 28 (*)    pO2, Arterial 64 (*)    Allens test (pass/fail) NO CHARGE (*)    All other components within normal limits  PROTIME-INR - Abnormal; Notable for the following:    Prothrombin Time 22.5 (*)    All other components within normal limits  APTT - Abnormal; Notable for the following:    aPTT 37 (*)    All other components within normal limits  CULTURE, BLOOD (ROUTINE X 2)  CULTURE, BLOOD (ROUTINE X 2)  TROPONIN I  LIPASE, BLOOD  TSH  HEMOGLOBIN A1C   ____________________________________________  EKG  EKG read and interpreted by me shows sinus tachycardia rate 135 left axis no acute  ST-T wave changes computer is reading lateral ST segment depression which I do not see ____________________________________________  RADIOLOGY  Dg Chest Portable 1 View  Result Date: 08/03/2016 CLINICAL DATA:  Acute onset of shortness of breath EXAM: PORTABLE CHEST 1 VIEW COMPARISON:  07/14/2016 FINDINGS: Low lung volumes. Borderline cardiomegaly with mild central vascular congestion. Small bilateral effusions right greater than left. Worsening atelectasis or infiltrate at the right base. Atherosclerosis. No pneumothorax. Old right clavicle fracture. IMPRESSION: Low lung volumes with small bilateral right greater than left pleural effusions and increasing right basilar atelectasis or infiltrate. Electronically Signed   By: Donavan Foil M.D.   On: 08/03/2016 03:50    ____________________________________________   PROCEDURES  Procedure(s) performed:   Procedures  Critical Care performed:   ____________________________________________   INITIAL IMPRESSION / ASSESSMENT AND PLAN / ED COURSE  Pertinent labs & imaging results that were available during my care of the patient were reviewed by me and considered in my medical decision making (see chart for details).        ____________________________________________   FINAL CLINICAL IMPRESSION(S) / ED DIAGNOSES  Final diagnoses:  Dyspnea, unspecified type  Peripheral edema  Dyspnea  Community acquired pneumonia of right lower lobe of lung ([place])      NEW MEDICATIONS STARTED DURING THIS VISIT:  Current Discharge Medication List       Note:  This document was prepared using Dragon voice recognition software and may include unintentional dictation errors.    Nena Polio, MD 08/03/16 [phone]

## 2016-08-03 NOTE — ED Notes (Signed)
Report called to Melissa RN

## 2016-08-03 NOTE — Progress Notes (Signed)
[place] [facility] [place] NAME: Donald Bowers    MR#:  [id number]  DATE OF BIRTH:  [birth year]  SUBJECTIVE:  CHIEF COMPLAINT:  Sob better While resting, off BiPAP  REVIEW OF SYSTEMS:  CONSTITUTIONAL: No fever, fatigue or weakness.  EYES: No blurred or double vision.  EARS, NOSE, AND THROAT: No tinnitus or ear pain.  RESPIRATORY: No cough,Reporting Exertional shortness of breath, no wheezing or hemoptysis.  CARDIOVASCULAR: No chest pain, orthopnea, edema.  GASTROINTESTINAL: Reports abdominal discomfort No nausea, vomiting, diarrhea  GENITOURINARY: No dysuria, hematuria.  ENDOCRINE: No polyuria, nocturia,  HEMATOLOGY: No anemia, easy bruising or bleeding SKIN: No rash or lesion. MUSCULOSKELETAL: No joint pain or arthritis.   NEUROLOGIC: No tingling, numbness, weakness.  PSYCHIATRY: No anxiety or depression.   DRUG ALLERGIES:  No Known Allergies  VITALS:  Blood pressure 119/63, pulse (!) 112, temperature 98.2 F (36.8 C), temperature source Oral, resp. rate 20, height 6' (1.829 m), weight 97.4 kg (214 lb 11.2 oz), SpO2 98 %.  PHYSICAL EXAMINATION:  GENERAL:  61 y.o.-year-old patient lying in the bed with no acute distress. Jaundiced EYES: Pupils equal, round, reactive to light and accommodation. Positive scleral icterus. Extraocular muscles intact.  HEENT: Head atraumatic, normocephalic. Oropharynx and nasopharynx clear.  NECK:  Supple, no jugular venous distention. No thyroid enlargement, no tenderness.  LUNGS: Diminished lower lung breath sounds bilaterally, no wheezing, rales,rhonchi or crepitation. No use of accessory muscles of respiration.  CARDIOVASCULAR: S1, S2 tachycardic. No murmurs, rubs, or gallops.  ABDOMEN: Soft, distended. Bowel sounds present.  EXTREMITIES: No pedal edema, cyanosis, or clubbing.  NEUROLOGIC: Following verbal commands no cranial nerve deficit noticed, alert and oriented Sensation intact. Gait not  checked.  PSYCHIATRIC: The patient is alert and oriented x 3.  SKIN: No obvious rash, lesion, or ulcer.    LABORATORY PANEL:   CBC  Recent Labs Lab 08/03/16 0324  WBC 9.7  HGB 8.9*  HCT 28.4*  PLT 214   ------------------------------------------------------------------------------------------------------------------  Chemistries   Recent Labs Lab 08/03/16 0324  NA 132*  K 4.6  CL 101  CO2 23  GLUCOSE 135*  BUN 17  CREATININE 0.72  CALCIUM 8.7*  AST 55*  ALT 18  ALKPHOS 98  BILITOT 5.9*   ------------------------------------------------------------------------------------------------------------------  Cardiac Enzymes  Recent Labs Lab 08/03/16 0324  TROPONINI <0.03   ------------------------------------------------------------------------------------------------------------------  RADIOLOGY:  Dg Chest Portable 1 View  Result Date: 08/03/2016 CLINICAL DATA:  Acute onset of shortness of breath EXAM: PORTABLE CHEST 1 VIEW COMPARISON:  07/14/2016 FINDINGS: Low lung volumes. Borderline cardiomegaly with mild central vascular congestion. Small bilateral effusions right greater than left. Worsening atelectasis or infiltrate at the right base. Atherosclerosis. No pneumothorax. Old right clavicle fracture. IMPRESSION: Low lung volumes with small bilateral right greater than left pleural effusions and increasing right basilar atelectasis or infiltrate. Electronically Signed   By: Donavan Foil M.D.   On: 08/03/2016 03:50    EKG:   Orders placed or performed during the hospital encounter of 08/03/16  . EKG 12-Lead  . EKG 12-Lead    ASSESSMENT AND PLAN:   1. Sepsis: The patient's criteria via tachycardia and tachypnea.   The patient has received broad-spectrum antibiotics in the emergency department. Source is likely SBP. The patient has been on Cipro prophylactically. Cefotaxime is not available in the pharmacy will provide Rocephin 2 g IV  We'll obtain cultures  from ascitic fluid. GI consult  2. Ascites: Secondary to cirrhosis. Obtain fluid studies  following paracentesis. Continue Lasix and spironolactone appropriate ratio.  3. Respiratory alkalosis: Chronic; secondary to liver disease. Work of breathing has decreased. Patient is still alert. Expect shortness of breath to improve following paracentesis.  4. DVT prophylaxis: SCDs  5. GI prophylaxis: Pantoprazole per home regimen     All the records are reviewed and case discussed with Care Management/Social Workerr. Management plans discussed with the patient, family and they are in agreement.  CODE STATUS: fc   TOTAL TIME TAKING CARE OF THIS PATIENT: 35 minutes.   POSSIBLE D/C IN 2  DAYS, DEPENDING ON CLINICAL CONDITION.  Note: This dictation was prepared with Dragon dictation along with smaller phrase technology. Any transcriptional errors that result from this process are unintentional.   Nicholes Mango M.D on 08/03/2016 at 1:19 PM  Between 7am to 6pm - Pager - [phone] After 6pm go to [email or web address] - password [email or web address] [facility]  Office  [phone]  CC: Primary care physician; Dion Body, MD

## 2016-08-03 NOTE — Plan of Care (Signed)
Problem: Fluid Volume: Goal: Hemodynamic stability will improve Paracentesis 2.6 l drawn  02 cont 4l [place]  Dyspnea improved  Post procedure.

## 2016-08-03 NOTE — H&P (Signed)
Donald Bowers is an 61 y.o. male.   Chief Complaint: Shortness of breath HPI: The patient with past medical history of cirrhosis of the liver and metastatic hepatocellular carcinoma in addition to COPD presents to the emergency department complaining of shortness of breath. The patient also states that he "hurts all over". Chest x-ray in the emergency department showed a right greater than left small size pleural effusions. Due to the patient's increased work of breathing he was initially placed on BiPAP. ABG showed respiratory alkalosis. The patient was given pain medicine and discontinued from BiPAP. Lasix 40 mg IV given. Supplemental oxygen provided by nasal cannula. Laboratory evaluation revealed mild hyponatremia in addition to elevated lactic acid. The patient was given 2 L of normal saline prior to the hospitalist service being asked to admit for further management.  Past Medical History:  Diagnosis Date  . Arthritis   . Cancer ([place])    liver  . Cocaine abuse   . COPD (chronic obstructive pulmonary disease) ([place])   . ETOH abuse   . GI bleed   . Hepatitis C   . Pneumonia     Past Surgical History:  Procedure Laterality Date  . CARDIAC CATHETERIZATION    . ESOPHAGOGASTRODUODENOSCOPY (EGD) WITH PROPOFOL N/A 06/21/2016   Procedure: ESOPHAGOGASTRODUODENOSCOPY (EGD) WITH PROPOFOL;  Surgeon: Wilford Corner, MD;  Location: [facility] ENDOSCOPY;  Service: Endoscopy;  Laterality: N/A;    Family History  Problem Relation Age of Onset  . COPD Mother   . Diabetes Sister   . Breast cancer Sister   . Cancer Father        back cancer  . Breast cancer Maternal Aunt   . Breast cancer Paternal Uncle    Social History:  reports that he has quit smoking. His smoking use included Cigarettes. He smoked 0.50 packs per day. He has never used smokeless tobacco. He reports that he uses drugs, including "Crack" cocaine and Cocaine. He reports that he does not drink alcohol.  Allergies: No Known  Allergies  Prior to Admission medications   Medication Sig Start Date End Date Taking? Authorizing Provider  ciprofloxacin (CIPRO) 500 MG tablet Take 1 tab daily Patient taking differently: Take 500 mg by mouth daily with breakfast.  07/21/16  Yes Fritzi Mandes, MD  feeding supplement, ENSURE ENLIVE, (ENSURE ENLIVE) LIQD Take 237 mLs by mouth 3 (three) times daily between meals. 07/18/16  Yes Fritzi Mandes, MD  folic acid (FOLVITE) 1 MG tablet Take 1 tablet (1 mg total) by mouth daily. 06/24/16  Yes Fritzi Mandes, MD  furosemide (LASIX) 40 MG tablet Take 1 tablet (40 mg total) by mouth daily. 07/21/16  Yes Fritzi Mandes, MD  Multiple Vitamin (MULTIVITAMIN WITH MINERALS) TABS tablet Take 1 tablet by mouth daily. 06/24/16  Yes Fritzi Mandes, MD  oxyCODONE (OXY IR/ROXICODONE) 5 MG immediate release tablet Take 1 tablet (5 mg total) by mouth 2 (two) times daily as needed for severe pain. 07/29/16  Yes Lloyd Huger, MD  pantoprazole (PROTONIX) 40 MG tablet Take 1 tablet (40 mg total) by mouth daily. 07/29/16  Yes Lloyd Huger, MD  spironolactone (ALDACTONE) 25 MG tablet Take 1 tablet (25 mg total) by mouth daily. 07/19/16  Yes Fritzi Mandes, MD  sucralfate (CARAFATE) 1 GM/10ML suspension Take 10 mLs (1 g total) by mouth 4 (four) times daily -  with meals and at bedtime. 06/24/16  Yes Fritzi Mandes, MD  traMADol (ULTRAM) 50 MG tablet Take 1 tablet (50 mg total) by mouth every  6 (six) hours as needed for moderate pain. 07/09/16  Yes Lloyd Huger, MD     Results for orders placed or performed during the hospital encounter of 08/03/16 (from the past 48 hour(s))  Lactic acid, plasma     Status: Abnormal   Collection Time: 08/03/16  3:23 AM  Result Value Ref Range   Lactic Acid, Venous 4.6 (HH) 0.5 - 1.9 mmol/L    Comment: CRITICAL RESULT CALLED TO, READ BACK BY AND VERIFIED WITH MICHELE MORTON AT 3267 08/03/16.PMH  Brain natriuretic peptide     Status: Abnormal   Collection Time: 08/03/16  3:23 AM  Result  Value Ref Range   B Natriuretic Peptide 148.0 (H) 0.0 - 100.0 pg/mL  Urinalysis, Complete w Microscopic     Status: Abnormal   Collection Time: 08/03/16  3:23 AM  Result Value Ref Range   Color, Urine AMBER (A) YELLOW    Comment: BIOCHEMICALS MAY BE AFFECTED BY COLOR   APPearance CLEAR (A) CLEAR   Specific Gravity, Urine 1.015 1.005 - 1.030   pH 6.0 5.0 - 8.0   Glucose, UA NEGATIVE NEGATIVE mg/dL   Hgb urine dipstick MODERATE (A) NEGATIVE   Bilirubin Urine NEGATIVE NEGATIVE   Ketones, ur NEGATIVE NEGATIVE mg/dL   Protein, ur NEGATIVE NEGATIVE mg/dL   Nitrite NEGATIVE NEGATIVE   Leukocytes, UA SMALL (A) NEGATIVE   RBC / HPF 6-30 0 - 5 RBC/hpf   WBC, UA 0-5 0 - 5 WBC/hpf   Bacteria, UA RARE (A) NONE SEEN   Squamous Epithelial / LPF 0-5 (A) NONE SEEN   Mucous PRESENT   Ammonia     Status: Abnormal   Collection Time: 08/03/16  3:23 AM  Result Value Ref Range   Ammonia 43 (H) 9 - 35 umol/L  Comprehensive metabolic panel     Status: Abnormal   Collection Time: 08/03/16  3:24 AM  Result Value Ref Range   Sodium 132 (L) 135 - 145 mmol/L   Potassium 4.6 3.5 - 5.1 mmol/L   Chloride 101 101 - 111 mmol/L   CO2 23 22 - 32 mmol/L   Glucose, Bld 135 (H) 65 - 99 mg/dL   BUN 17 6 - 20 mg/dL   Creatinine, Ser 0.72 0.61 - 1.24 mg/dL   Calcium 8.7 (L) 8.9 - 10.3 mg/dL   Total Protein 8.6 (H) 6.5 - 8.1 g/dL   Albumin 2.4 (L) 3.5 - 5.0 g/dL   AST 55 (H) 15 - 41 U/L   ALT 18 17 - 63 U/L   Alkaline Phosphatase 98 38 - 126 U/L   Total Bilirubin 5.9 (H) 0.3 - 1.2 mg/dL   GFR calc non Af Amer >60 >60 mL/min   GFR calc Af Amer >60 >60 mL/min    Comment: (NOTE) The eGFR has been calculated using the CKD EPI equation. This calculation has not been validated in all clinical situations. eGFR's persistently <60 mL/min signify possible Chronic Kidney Disease.    Anion gap 8 5 - 15  Troponin I     Status: None   Collection Time: 08/03/16  3:24 AM  Result Value Ref Range   Troponin I <0.03 <0.03  ng/mL  Lipase, blood     Status: None   Collection Time: 08/03/16  3:24 AM  Result Value Ref Range   Lipase 44 11 - 51 U/L  CBC with Differential     Status: Abnormal   Collection Time: 08/03/16  3:24 AM  Result Value Ref Range  WBC 9.7 3.8 - 10.6 K/uL   RBC 3.39 (L) 4.40 - 5.90 MIL/uL   Hemoglobin 8.9 (L) 13.0 - 18.0 g/dL   HCT 28.4 (L) 40.0 - 52.0 %   MCV 83.9 80.0 - 100.0 fL   MCH 26.2 26.0 - 34.0 pg   MCHC 31.2 (L) 32.0 - 36.0 g/dL   RDW 22.1 (H) 11.5 - 14.5 %   Platelets 214 150 - 440 K/uL   Neutrophils Relative % 88 %   Neutro Abs 8.5 (H) 1.4 - 6.5 K/uL   Lymphocytes Relative 7 %   Lymphs Abs 0.7 (L) 1.0 - 3.6 K/uL   Monocytes Relative 4 %   Monocytes Absolute 0.4 0.2 - 1.0 K/uL   Eosinophils Relative 1 %   Eosinophils Absolute 0.1 0 - 0.7 K/uL   Basophils Relative 0 %   Basophils Absolute 0.0 0 - 0.1 K/uL  Blood gas, arterial ([facility] & [facility] ONLY)     Status: Abnormal   Collection Time: 08/03/16  3:42 AM  Result Value Ref Range   FIO2 0.28    Delivery systems NO CHARGE    pH, Arterial 7.49 (H) 7.350 - 7.450   pCO2 arterial 28 (L) 32.0 - 48.0 mmHg   pO2, Arterial 64 (L) 83.0 - 108.0 mmHg   Bicarbonate 21.3 20.0 - 28.0 mmol/L   Acid-base deficit 1.5 0.0 - 2.0 mmol/L   O2 Saturation 93.8 %   Patient temperature 37.0    Collection site RIGHT RADIAL    Sample type ARTERIAL DRAW    Allens test (pass/fail) NO CHARGE (A) PASS   Dg Chest Portable 1 View  Result Date: 08/03/2016 CLINICAL DATA:  Acute onset of shortness of breath EXAM: PORTABLE CHEST 1 VIEW COMPARISON:  07/14/2016 FINDINGS: Low lung volumes. Borderline cardiomegaly with mild central vascular congestion. Small bilateral effusions right greater than left. Worsening atelectasis or infiltrate at the right base. Atherosclerosis. No pneumothorax. Old right clavicle fracture. IMPRESSION: Low lung volumes with small bilateral right greater than left pleural effusions and increasing right basilar atelectasis or infiltrate.  Electronically Signed   By: Donavan Foil M.D.   On: 08/03/2016 03:50    Review of Systems  Constitutional: Negative for chills and fever.  HENT: Negative for sore throat and tinnitus.   Eyes: Negative for blurred vision and redness.  Respiratory: Positive for sputum production. Negative for cough and shortness of breath.   Cardiovascular: Negative for chest pain, palpitations, orthopnea and PND.  Gastrointestinal: Positive for abdominal pain. Negative for diarrhea, nausea and vomiting.  Genitourinary: Negative for dysuria, frequency and urgency.  Musculoskeletal: Positive for back pain. Negative for joint pain and myalgias.  Skin: Negative for rash.       No lesions  Neurological: Positive for weakness. Negative for speech change and focal weakness.  Endo/Heme/Allergies: Does not bruise/bleed easily.       No temperature intolerance  Psychiatric/Behavioral: Negative for depression and suicidal ideas.    Blood pressure 128/70, pulse (!) 121, temperature 99.3 F (37.4 C), resp. rate (!) 25, height 6' (1.829 m), weight 93.9 kg (207 lb), SpO2 98 %. Physical Exam  Constitutional: He is oriented to person, place, and time. He appears well-developed and well-nourished. No distress.  HENT:  Head: Normocephalic and atraumatic.  Mouth/Throat: Oropharynx is clear and moist.  Eyes: Conjunctivae and EOM are normal. Pupils are equal, round, and reactive to light. Scleral icterus is present.  Neck: Normal range of motion. Neck supple. No JVD present. No tracheal deviation present.  No thyromegaly present.  Cardiovascular: Regular rhythm and normal heart sounds.  Tachycardia present.  Exam reveals no gallop and no friction rub.   No murmur heard. Respiratory: Breath sounds normal. Tachypnea noted.  GI: Soft. Bowel sounds are normal. He exhibits distension. He exhibits no mass. There is tenderness. There is no rebound and no guarding.  Genitourinary:  Genitourinary Comments: Deferred   Musculoskeletal: Normal range of motion. He exhibits no edema.  Lymphadenopathy:    He has no cervical adenopathy.  Neurological: He is alert and oriented to person, place, and time. No cranial nerve deficit.  Skin: Skin is warm and dry. No rash noted. No erythema.  Jaundiced  Psychiatric: He has a normal mood and affect. His behavior is normal. Judgment and thought content normal.     Assessment/Plan This is a 61 year old male admitted for sepsis. 1. Sepsis: The patient's criteria via tachycardia and tachypnea. The latter secondary to chronic respiratory alkalosis. The patient is received broad-spectrum antibiotics in the emergency department. Source is likely SBP. The patient has been on Cipro prophylactically. White count is normal. We'll obtain cultures from ascitic fluid. 2. Ascites: Secondary to cirrhosis. Obtain fluid studies following paracentesis. Continue Lasix and spironolactone appropriate ratio. 3. Respiratory alkalosis: Chronic; secondary to liver disease. Work of breathing has decreased. Patient is still alert. Expect shortness of breath to improve following paracentesis. 4. DVT prophylaxis: SCDs 5. GI prophylaxis: Pantoprazole per home regimen The patient is a full code. Time spent on admission orders and patient care approximately 45 minutes  Harrie Foreman, MD 08/03/2016, 6:38 AM

## 2016-08-03 NOTE — Procedures (Signed)
Ultrasound-guided diagnostic and therapeutic paracentesis performed yielding 2.6 liters of yellow fluid. No immediate complications. A portion of the fluid was sent to the lab for preordered studies.

## 2016-08-03 NOTE — ED Triage Notes (Signed)
Patient brought in by ems from home. Patient with complaint of shortness of breath, abdominal pain and pain and swelling to bilateral legs times two days. Patient recently diagnosed with liver cancer.

## 2016-08-03 NOTE — Progress Notes (Signed)
Pt just back on floor from having paracentesis done. 2.6 liters drawn off.

## 2016-08-04 DIAGNOSIS — K7031 Alcoholic cirrhosis of liver with ascites: Secondary | ICD-10-CM

## 2016-08-04 LAB — CBC
HEMATOCRIT: 24.1 % — AB (ref 40.0–52.0)
HEMOGLOBIN: 7.6 g/dL — AB (ref 13.0–18.0)
MCH: 26 pg (ref 26.0–34.0)
MCHC: 31.3 g/dL — ABNORMAL LOW (ref 32.0–36.0)
MCV: 83.1 fL (ref 80.0–100.0)
Platelets: 163 10*3/uL (ref 150–440)
RBC: 2.9 MIL/uL — AB (ref 4.40–5.90)
RDW: 21.9 % — ABNORMAL HIGH (ref 11.5–14.5)
WBC: 10.7 10*3/uL — AB (ref 3.8–10.6)

## 2016-08-04 LAB — PATHOLOGIST SMEAR REVIEW

## 2016-08-04 LAB — AMMONIA: AMMONIA: 54 umol/L — AB (ref 9–35)

## 2016-08-04 MED ORDER — DILTIAZEM LOAD VIA INFUSION: 5.0000 mg | Freq: Once | INTRAVENOUS | Status: DC

## 2016-08-04 MED ORDER — LACTULOSE 10 GM/15ML PO SOLN
10.0000 g | Freq: Two times a day (BID) | ORAL | Status: DC
  Administered 2016-08-04 – 2016-08-06 (×5): 10 g via ORAL
  Filled 2016-08-04 (×5): qty 30

## 2016-08-04 MED ORDER — METOPROLOL TARTRATE 5 MG/5ML IV SOLN
5.0000 mg | Freq: Once | INTRAVENOUS | Status: AC
  Administered 2016-08-04: 21:00:00 5 mg via INTRAVENOUS
  Filled 2016-08-04: qty 5

## 2016-08-04 MED ORDER — MORPHINE SULFATE (PF) 2 MG/ML IV SOLN
2.0000 mg | INTRAVENOUS | Status: DC | PRN
  Administered 2016-08-04 – 2016-08-06 (×6): 2 mg via INTRAVENOUS
  Filled 2016-08-04 (×7): qty 1

## 2016-08-04 MED ORDER — DEXTROSE 5 % IV SOLN
2.0000 g | INTRAVENOUS | Status: DC
  Administered 2016-08-04 – 2016-08-06 (×3): 2 g via INTRAVENOUS
  Filled 2016-08-04 (×4): qty 2

## 2016-08-04 NOTE — Progress Notes (Addendum)
Visit made. Patient is currently followed by [facility] receiving skilled nursing and social work services as well as Home Palliative services. He lives with his son. He is a Full code. Patient seen sitting up in the recliner alert, increased work of breathing and confusion  noted, oxygen in place, staff RN Freddie Breech present and addressed with PRN IV morphine as ordered. Patient also complained of nausea, and vomited and a small amount of emesis. Again addressed by staff RN Freddie Breech. Infectious disease and gastro consult pending. Patient could benefit form continued Palliative medicine services. He is hospice appropriate. Will continue to follow and update [facility] team.  Flo Shanks RN, BSN, [place], hospital Liaison [phone] c

## 2016-08-04 NOTE — Progress Notes (Signed)
Initial Nutrition Assessment  DOCUMENTATION CODES:   Severe malnutrition in context of acute illness/injury  INTERVENTION:  Recommend liberalizing diet from heart healthy to 2 gram sodium diet.  Continue Ensure Enlive po TID, each supplement provides 350 kcal and 20 grams of protein.   Continue daily multivitamin with minerals.  Encouraged adequate intake of calories and protein with meals.   NUTRITION DIAGNOSIS:   Malnutrition (Severe) related to acute illness (hepatocellular carcinoma, ascites) as evidenced by 8.2 percent weight loss over 5 weeks, moderate depletion of body fat, severe depletion of body fat, moderate depletions of muscle mass, severe depletion of muscle mass.  GOAL:   Patient will meet greater than or equal to 90% of their needs  MONITOR:   PO intake, Supplement acceptance, Labs, Weight trends, I & O's  REASON FOR ASSESSMENT:   Malnutrition Screening Tool    ASSESSMENT:   61 year old male with PMHx of COPD, Hepatitis C, hx GI bleed, EtOH abuse, cocaine abuse, hepatocellular carcinoma, cirrhosis, ascites presents with shortness of breath and admitted with sepsis, ascites, respiratory alkalosis.   -Hx of portal hypertension with recent GI bleed. -Patient has not received treatment for [facility] due to worsening liver function. -Followed by [facility] and Palliative Care at home. -s/p paracentesis on 6/3 with 2.6 L fluid removed.  Spoke with patient at bedside. He is known to this RD from several previous admissions. Patient was difficult to arouse today and not able to give a very clear history. While eating breakfast he had spilled coffee all over tray table and also had difficulty picking up water to drink due to weakness. Patient only reports his appetite is "alright." He reports he eats when he is hungry, but unable to provide any further details on how often this is per day and what types of foods he eats. He continues to drink milk (likes whole milk). He  reports he did purchase some Ensure for home but has only had 1-2 bottles total since last discharge.  Current body weight likely elevated in setting of ascites and edema. Patient with pitting edema up both legs (all the way to hips), which has not previously been present on previous NFPEs. UBW was 205 lbs. Patient had previously lost 17 lbs (8.2% body weight) over 5 weeks, which is significant for time frame. Has likely lost more true body weight. Will use weight of 172.9 lbs (78.4 kg) from last assessment on 5/17 to estimate needs.  Meal Completion: 75% of dinner last night and breakfast this morning per chart  Medications reviewed and include: Cipro, Colace, folic acid 1 mg daily, Lasix 40 mg daily, MVI daily, pantoprazole, NS @ 100 ml/hr, ceftriaxone.  Labs reviewed: Sodium 133, Ammonia 54.   Nutrition-Focused physical exam completed. Findings are moderate-severe fat depletion, moderate-severe muscle depletion, and moderate edema. Abdomen distended. Pt appears jaundiced.  Discussed with RN.  Diet Order:  Diet Heart Room service appropriate? Yes; Fluid consistency: Thin  Skin:  Reviewed, no issues  Last BM:  08/03/2016  Height:   Ht Readings from Last 1 Encounters:  08/03/16 6' (1.829 m)    Weight:   Wt Readings from Last 1 Encounters:  08/03/16 214 lb 11.2 oz (97.4 kg)    Ideal Body Weight:  80.9 kg  BMI:  Body mass index is 29.12 kg/m.  Estimated Nutritional Needs:   Kcal:  7322-0254 (MSJ x 1.3-1.5)  Protein:  100-120 grams (1.3-1.5 grams/kg)  Fluid:  2.1-2.4 L/day  EDUCATION NEEDS:   No education  needs identified at this time  Willey Blade, MS, RD, LDN Pager: [phone] After Hours Pager: [phone]

## 2016-08-04 NOTE — Consult Note (Signed)
Donald Lame, MD [facility]  [address] [name], [name] [address] Phone: [phone] Fax : [phone]  Consultation  Referring Provider:     Dr. Margaretmary Bowers Primary Care Physician:  Donald Body, MD Primary Gastroenterologist:  Donald Bowers         Reason for Consultation:     SBP  Date of Admission:  08/03/2016 Date of Consultation:  08/04/2016         HPI:   Donald Bowers is a 61 y.o. male who has a history of hepatitis C with a history of alcohol abuse and appendiceal carcinoma. The patient was recently discharged with SBP evident with a white cell count of over 10,000 with 100% neutrophils.  The patient was treated with antibiotics and now is admitted with abdominal pain.  The hospitalist consulted GI prior to having a paracentesis which eventually showed the patient not to have SBP. The patient is a poor historian but family members are able to fill of the blanks.  The patient had a paracentesis and is reporting that the area where the paracentesis was done and is now leaking fluid.  The patient was sitting up and eating when I saw him today and somewhat confused.  Past Medical History:  Diagnosis Date  . Arthritis   . Cancer ([place])    liver  . Cocaine abuse   . COPD (chronic obstructive pulmonary disease) ([place])   . ETOH abuse   . GI bleed   . Hepatitis C   . Pneumonia     Past Surgical History:  Procedure Laterality Date  . CARDIAC CATHETERIZATION    . ESOPHAGOGASTRODUODENOSCOPY (EGD) WITH PROPOFOL N/A 06/21/2016   Procedure: ESOPHAGOGASTRODUODENOSCOPY (EGD) WITH PROPOFOL;  Surgeon: Wilford Corner, MD;  Location: [facility] ENDOSCOPY;  Service: Endoscopy;  Laterality: N/A;    Prior to Admission medications   Medication Sig Start Date End Date Taking? Authorizing Provider  ciprofloxacin (CIPRO) 500 MG tablet Take 1 tab daily Patient taking differently: Take 500 mg by mouth daily with breakfast.  07/21/16  Yes Donald Mandes, MD  feeding supplement, ENSURE ENLIVE,  (ENSURE ENLIVE) LIQD Take 237 mLs by mouth 3 (three) times daily between meals. 07/18/16  Yes Donald Mandes, MD  folic acid (FOLVITE) 1 MG tablet Take 1 tablet (1 mg total) by mouth daily. 06/24/16  Yes Donald Mandes, MD  furosemide (LASIX) 40 MG tablet Take 1 tablet (40 mg total) by mouth daily. 07/21/16  Yes Donald Mandes, MD  Multiple Vitamin (MULTIVITAMIN WITH MINERALS) TABS tablet Take 1 tablet by mouth daily. 06/24/16  Yes Donald Mandes, MD  oxyCODONE (OXY IR/ROXICODONE) 5 MG immediate release tablet Take 1 tablet (5 mg total) by mouth 2 (two) times daily as needed for severe pain. 07/29/16  Yes Donald Huger, MD  pantoprazole (PROTONIX) 40 MG tablet Take 1 tablet (40 mg total) by mouth daily. 07/29/16  Yes Donald Huger, MD  spironolactone (ALDACTONE) 25 MG tablet Take 1 tablet (25 mg total) by mouth daily. 07/19/16  Yes Donald Mandes, MD  sucralfate (CARAFATE) 1 GM/10ML suspension Take 10 mLs (1 g total) by mouth 4 (four) times daily -  with meals and at bedtime. 06/24/16  Yes Donald Mandes, MD  traMADol (ULTRAM) 50 MG tablet Take 1 tablet (50 mg total) by mouth every 6 (six) hours as needed for moderate pain. 07/09/16  Yes Donald Huger, MD    Family History  Problem Relation Age of Onset  . COPD Mother   . Diabetes Sister   .  Breast cancer Sister   . Cancer Father        back cancer  . Breast cancer Maternal Aunt   . Breast cancer Paternal Uncle      Social History  Substance Use Topics  . Smoking status: Former Smoker    Packs/day: 0.50    Types: Cigarettes  . Smokeless tobacco: Never Used     Comment: pt said he has all the information he needs   . Alcohol use No     Comment: weekly/daily, pt report drinking 2 can in 2weeks    Allergies as of 08/03/2016  . (No Known Allergies)    Review of Systems:    All systems reviewed and negative except where noted in HPI.   Physical Exam:  Vital signs in last 24 hours: Temp:  [97.3 F (36.3 C)-100.6 F (38.1 C)] 97.3 F (36.3  C) (06/04 1317) Pulse Rate:  [90-104] 93 (06/04 1317) Resp:  [20] 20 (06/04 1317) BP: (98-115)/(63-76) 115/63 (06/04 1317) SpO2:  [100 %] 100 % (06/04 1317) Last BM Date: 08/03/16 General:   Pleasant, cooperative in NAD Head:  Normocephalic and atraumatic. Eyes:   No icterus.   Conjunctiva pink. PERRLA. Ears:  Normal auditory acuity. Neck:  Supple; no masses or thyroidomegaly Lungs: Respirations even and unlabored. Lungs clear to auscultation bilaterally.   No wheezes, crackles, or rhonchi.  Heart:  Regular rate and rhythm;  Without murmur, clicks, rubs or gallops Abdomen:  Soft, Positive distention with ascites, nontender. Normal bowel sounds. No appreciable masses or hepatomegaly.  No rebound or guarding.  Rectal:  Not performed. Msk:  Symmetrical without gross deformities.    Extremities:  Without edema, cyanosis or clubbing. Neurologic:  Confused . Skin:  Intact without significant lesions or rashes. Cervical Nodes:  No significant cervical adenopathy. Psych:  Alert and cooperative. Normal affect.  LAB RESULTS:  Recent Labs  08/03/16 0324 08/04/16 0604  WBC 9.7 10.7*  HGB 8.9* 7.6*  HCT 28.4* 24.1*  PLT 214 163   BMET  Recent Labs  08/03/16 0324 08/03/16 1559  NA 132* 133*  K 4.6 4.3  CL 101 103  CO2 23 25  GLUCOSE 135* 152*  BUN 17 18  CREATININE 0.72 0.87  CALCIUM 8.7* 8.2*   LFT  Recent Labs  08/03/16 1559  PROT 7.6  ALBUMIN 2.1*  AST 46*  ALT 16*  ALKPHOS 82  BILITOT 5.6*   PT/INR  Recent Labs  08/03/16 0641  LABPROT 22.5*  INR 1.95    STUDIES: US Paracentesis  Result Date: 08/03/2016 INDICATION: Hepatocellular carcinoma, hepatitis-C, cirrhosis, recurrent ascites. Request made for diagnostic and therapeutic paracentesis. EXAM: ULTRASOUND GUIDED DIAGNOSTIC AND THERAPEUTIC PARACENTESIS MEDICATIONS: None. COMPLICATIONS: None immediate. PROCEDURE: Informed written consent was obtained from the patient after a discussion of the risks,  benefits and alternatives to treatment. A timeout was performed prior to the initiation of the procedure. Initial ultrasound scanning demonstrates a small to moderate amount of ascites within the right lower abdominal quadrant. The right lower abdomen was prepped and draped in the usual sterile fashion. 1% lidocaine was used for local anesthesia. Following this, a Yueh catheter was introduced. An ultrasound image was saved for documentation purposes. The paracentesis was performed. The catheter was removed and a dressing was applied. The patient tolerated the procedure well without immediate post procedural complication. FINDINGS: A total of approximately 2.6 liters of yellow fluid was removed. Samples were sent to the laboratory as requested by the clinical team. IMPRESSION: Successful ultrasound-guided  diagnostic and therapeutic paracentesis yielding 2.6 liters of peritoneal fluid. Read by: Rowe Robert, PA-C Electronically Signed   By: Sandi Mariscal M.D.   On: 08/03/2016 14:51   Dg Chest Portable 1 View  Result Date: 08/03/2016 CLINICAL DATA:  Acute onset of shortness of breath EXAM: PORTABLE CHEST 1 VIEW COMPARISON:  07/14/2016 FINDINGS: Low lung volumes. Borderline cardiomegaly with mild central vascular congestion. Small bilateral effusions right greater than left. Worsening atelectasis or infiltrate at the right base. Atherosclerosis. No pneumothorax. Old right clavicle fracture. IMPRESSION: Low lung volumes with small bilateral right greater than left pleural effusions and increasing right basilar atelectasis or infiltrate. Electronically Signed   By: Donavan Foil M.D.   On: 08/03/2016 03:50      Impression / Plan:   Donald Bowers is a 61 y.o. 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 The patient had a paracentesis that did not show SBP which is the reason GI was consulted.  The patient's diffuse abdominal pain is improved after the paracentesis and likely due to his  ascites rather than SBP.  The patient is now having leaking from the paracentesis site.  I have informed the nurse to put a pediatric urine bag around the site to catch the ascitic fluid since the patient is bothered by the accumulation of fluid on his gown and down his leg. The patient may need a large-volume paracentesis if the leaking site does not improve.  The patient will be given Lactulose for his probable hepatic encephalopathy.  Thank you for involving me in the care of this patient.      LOS: 1 day   Donald Lame, MD  08/04/2016, 7:01 PM   Note: This dictation was prepared with Dragon dictation along with smaller phrase technology. Any transcriptional errors that result from this process are unintentional.

## 2016-08-04 NOTE — Plan of Care (Signed)
Problem: Safety: Goal: Ability to remain free from injury will improve Outcome: Progressing High fall risk; exit alarm activated.

## 2016-08-04 NOTE — Progress Notes (Signed)
Notified md of hgb 7.6 /hct 24.1.  Also of ammonia 54. Response . She will take a look at them.

## 2016-08-04 NOTE — Progress Notes (Signed)
[place] [facility] [place] NAME: Donald Bowers    MR#:  [id number]  DATE OF BIRTH:  Oct 17, 1955  SUBJECTIVE:  CHIEF COMPLAINT: Lethargic but arousable has received morphine for pain. Had paracentesis yesterday  REVIEW OF SYSTEMS:  Review of systems Limited as the patient is lethargic  CONSTITUTIONAL: Had low-grade fever, reports fatigue or weakness.  RESPIRATORY: No cough, improved shortness of breath, no wheezing or hemoptysis.  CARDIOVASCULAR: No chest pain, orthopnea, edema.  GASTROINTESTINAL: No nausea, vomiting, diarrhea  NEUROLOGIC: Lethargic but arousable and answers few questions PSYCHIATRY: No anxiety or depression.   DRUG ALLERGIES:  No Known Allergies  VITALS:  Blood pressure 100/64, pulse (!) 104, temperature 97.5 F (36.4 C), temperature source Oral, resp. rate 18, height 6' (1.829 m), weight 97.4 kg (214 lb 11.2 oz), SpO2 100 %.  PHYSICAL EXAMINATION:  GENERAL:  61 y.o.-year-old patient lying in the bed with no acute distress. Jaundiced EYES: Pupils equal, round, reactive to light and accommodation. Positive scleral icterus. Extraocular muscles intact.  HEENT: Head atraumatic, normocephalic. Oropharynx and nasopharynx clear.  NECK:  Supple, no jugular venous distention. No thyroid enlargement, no tenderness.  LUNGS: Diminished lower lung breath sounds bilaterally, no wheezing, rales,rhonchi or crepitation. No use of accessory muscles of respiration.  CARDIOVASCULAR: S1, S2 tachycardic. No murmurs, rubs, or gallops.  ABDOMEN: Soft, Less distended distended. Status post paracentesis 08/03/2016 Bowel sounds present.  EXTREMITIES: + pedal edema, NO cyanosis, or clubbing.  NEUROLOGIC: Lethargic but arousable and answers few questions  PSYCHIATRIC: The patient is lethargic but arousable  SKIN: No obvious rash, lesion, or ulcer.    LABORATORY PANEL:   CBC  Recent Labs Lab 08/04/16 0604  WBC 10.7*  HGB 7.6*  HCT  24.1*  PLT 163   ------------------------------------------------------------------------------------------------------------------  Chemistries   Recent Labs Lab 08/03/16 1559  NA 133*  K 4.3  CL 103  CO2 25  GLUCOSE 152*  BUN 18  CREATININE 0.87  CALCIUM 8.2*  AST 46*  ALT 16*  ALKPHOS 82  BILITOT 5.6*   ------------------------------------------------------------------------------------------------------------------  Cardiac Enzymes  Recent Labs Lab 08/03/16 0324  TROPONINI <0.03   ------------------------------------------------------------------------------------------------------------------  RADIOLOGY:  US Paracentesis  Result Date: 08/03/2016 INDICATION: Hepatocellular carcinoma, hepatitis-C, cirrhosis, recurrent ascites. Request made for diagnostic and therapeutic paracentesis. EXAM: ULTRASOUND GUIDED DIAGNOSTIC AND THERAPEUTIC PARACENTESIS MEDICATIONS: None. COMPLICATIONS: None immediate. PROCEDURE: Informed written consent was obtained from the patient after a discussion of the risks, benefits and alternatives to treatment. A timeout was performed prior to the initiation of the procedure. Initial ultrasound scanning demonstrates a small to moderate amount of ascites within the right lower abdominal quadrant. The right lower abdomen was prepped and draped in the usual sterile fashion. 1% lidocaine was used for local anesthesia. Following this, a Yueh catheter was introduced. An ultrasound image was saved for documentation purposes. The paracentesis was performed. The catheter was removed and a dressing was applied. The patient tolerated the procedure well without immediate post procedural complication. FINDINGS: A total of approximately 2.6 liters of yellow fluid was removed. Samples were sent to the laboratory as requested by the clinical team. IMPRESSION: Successful ultrasound-guided diagnostic and therapeutic paracentesis yielding 2.6 liters of peritoneal fluid. Read  by: Rowe Robert, PA-C Electronically Signed   By: Sandi Mariscal M.D.   On: 08/03/2016 14:51   Dg Chest Portable 1 View  Result Date: 08/03/2016 CLINICAL DATA:  Acute onset of shortness of breath EXAM: PORTABLE CHEST 1 VIEW COMPARISON:  07/14/2016 FINDINGS:  Low lung volumes. Borderline cardiomegaly with mild central vascular congestion. Small bilateral effusions right greater than left. Worsening atelectasis or infiltrate at the right base. Atherosclerosis. No pneumothorax. Old right clavicle fracture. IMPRESSION: Low lung volumes with small bilateral right greater than left pleural effusions and increasing right basilar atelectasis or infiltrate. Electronically Signed   By: Donavan Foil M.D.   On: 08/03/2016 03:50    EKG:   Orders placed or performed during the hospital encounter of 08/03/16  . EKG 12-Lead  . EKG 12-Lead    ASSESSMENT AND PLAN:   1. Sepsis: The patient's criteria via tachycardia and tachypnea.   The patient has received broad-spectrum antibiotics in the emergency department. Source is likely SBP. The patient has been on Cipro prophylactically. Cefotaxime is not available in the pharmacy will provide Rocephin 2 g IVOnce daily  ascitic fluid-abundant WBC-mononuclear GI consult and ID consult pending  2. Ascites: Secondary to cirrhosis Status post ultrasound-guided paracentesis on June 3 and had 2.6 L of fluid extraction Follow-up in ascitic fluid studies  Continue Lasix and spironolactone appropriate ratio. Check ammonia level  3. Respiratory alkalosis: Chronic; secondary to liver disease. Work of breathing has decreased. Patient is still alert. Expect shortness of breath to improve following paracentesis.  4. DVT prophylaxis: SCDs  5. GI prophylaxis: Pantoprazole per home regimen     All the records are reviewed and case discussed with Care Management/Social Workerr. Management plans discussed with the patient, family and they are in agreement.  CODE STATUS: fc    TOTAL TIME TAKING CARE OF THIS PATIENT: 35 minutes.   POSSIBLE D/C IN 2  DAYS, DEPENDING ON CLINICAL CONDITION.  Note: This dictation was prepared with Dragon dictation along with smaller phrase technology. Any transcriptional errors that result from this process are unintentional.   Nicholes Mango M.D on 08/04/2016 at 8:49 AM  Between 7am to 6pm - Pager - [phone] After 6pm go to [email or web address] - password [email or web address] [facility]  Office  [phone]  CC: Primary care physician; Dion Body, MD

## 2016-08-04 NOTE — Care Management (Signed)
Admitted to [facility] with the diagnosis of sepsis.  Discharged from this facility 07/21/16. Lives with family. Primary Care physician is Dr. Netty Starring.  Followed by [facility] in the home. Paracentesis completed 08/03/16. 2.5 liters fluid.  Oxygen  3 liters per nasal cannula. IV Rocephin ordered. Infectious disease consult ordered. Hgb 7.6.  Shelbie Ammons RN MSN CCM Care Management [phone]

## 2016-08-04 NOTE — Consult Note (Signed)
[facility] Infectious Disease     Reason for Consult: sepsis    Referring Physician: Nicholes Mango Date of Admission:  08/03/2016   Active Problems:   Sepsis [facility])   HPI: Donald Bowers is a 61 y.o. male admitted with SOB and pain all over in setting of Hep C, Liver cancer. On admit wbc was 9.7 and afebrile but spiked to 100.6. BCX neg. UA neg, cxr neg. Had para donw with 404 Wbc only 27% PMNs. He is a poor historian. Reports pain all over as will as pain and swelling in his legs.   Past Medical History:  Diagnosis Date  . Arthritis   . Cancer ([place])    liver  . Cocaine abuse   . COPD (chronic obstructive pulmonary disease) ([place])   . ETOH abuse   . GI bleed   . Hepatitis C   . Pneumonia    Past Surgical History:  Procedure Laterality Date  . CARDIAC CATHETERIZATION    . ESOPHAGOGASTRODUODENOSCOPY (EGD) WITH PROPOFOL N/A 06/21/2016   Procedure: ESOPHAGOGASTRODUODENOSCOPY (EGD) WITH PROPOFOL;  Surgeon: Wilford Corner, MD;  Location: [facility] ENDOSCOPY;  Service: Endoscopy;  Laterality: N/A;   Social History  Substance Use Topics  . Smoking status: Former Smoker    Packs/day: 0.50    Types: Cigarettes  . Smokeless tobacco: Never Used     Comment: pt said he has all the information he needs   . Alcohol use No     Comment: weekly/daily, pt report drinking 2 can in 2weeks   Family History  Problem Relation Age of Onset  . COPD Mother   . Diabetes Sister   . Breast cancer Sister   . Cancer Father        back cancer  . Breast cancer Maternal Aunt   . Breast cancer Paternal Uncle     Allergies: No Known Allergies  Current antibiotics: Antibiotics Given (last 72 hours)    Date/Time Action Medication Dose Rate   08/03/16 0352 New Bag/Given   piperacillin-tazobactam (ZOSYN) IVPB 3.375 g 3.375 g 100 mL/hr   08/03/16 0831 Given   ciprofloxacin (CIPRO) tablet 500 mg 500 mg    08/03/16 0930 New Bag/Given  [not on floor notified pharmacy]   vancomycin (VANCOCIN) IVPB  1000 mg/200 mL premix 1,000 mg 200 mL/hr   08/03/16 1529 New Bag/Given  [off unit]   cefTRIAXone (ROCEPHIN) 1 g in dextrose 5 % 50 mL IVPB 1 g 100 mL/hr   08/04/16 1038 Given   ciprofloxacin (CIPRO) tablet 500 mg 500 mg    08/04/16 1039 New Bag/Given   cefTRIAXone (ROCEPHIN) 2 g in dextrose 5 % 50 mL IVPB 2 g 100 mL/hr      MEDICATIONS: . ciprofloxacin  500 mg Oral Q breakfast  . docusate sodium  100 mg Oral BID  . feeding supplement (ENSURE ENLIVE)  237 mL Oral TID BM  . folic acid  1 mg Oral Daily  . furosemide  40 mg Oral Daily  . mouth rinse  15 mL Mouth Rinse BID  . multivitamin with minerals  1 tablet Oral Daily  . pantoprazole  40 mg Oral Daily  . spironolactone  25 mg Oral Daily  . sucralfate  1 g Oral TID WC & HS    Review of Systems -unable to obtain   OBJECTIVE: Temp:  [97.3 F (36.3 C)-100.6 F (38.1 C)] 97.3 F (36.3 C) (06/04 1317) Pulse Rate:  [90-104] 93 (06/04 1317) Resp:  [20] 20 (06/04 1317) BP: (  98-115)/(63-76) 115/63 (06/04 1317) SpO2:  [100 %] 100 % (06/04 1317) Physical Exam  Constitutional: chronically ill appearing. Sitting up in chair with legs on ground HENT: jaundiced,  Mouth/Throat: Oropharynx is clear and moist. No oropharyngeal exudate.  Cardiovascular: Normal rate, regular rhythm and normal heart sounds. Pulmonary/Chest: Effort normal and breath sounds normal. No respiratory distress. He has no wheezes.  Abdominal: distended, mild diffuse ttp Lymphadenopathy: He has no cervical adenopathy.  Neurological: disoriented EXT 3+ Bil LE edema to upper shins Skin: bil LE with chronci stasis dermatitis changes, L post calf with skin breakdown and area of weeping fluid Psychiatric: disoriented  LABS: Results for orders placed or performed during the hospital encounter of 08/03/16 (from the past 48 hour(s))  Lactic acid, plasma     Status: Abnormal   Collection Time: 08/03/16  3:23 AM  Result Value Ref Range   Lactic Acid, Venous 4.6 (HH) 0.5  - 1.9 mmol/L    Comment: CRITICAL RESULT CALLED TO, READ BACK BY AND VERIFIED WITH MICHELE MORTON AT 4650 08/03/16.PMH  Brain natriuretic peptide     Status: Abnormal   Collection Time: 08/03/16  3:23 AM  Result Value Ref Range   B Natriuretic Peptide 148.0 (H) 0.0 - 100.0 pg/mL  Urinalysis, Complete w Microscopic     Status: Abnormal   Collection Time: 08/03/16  3:23 AM  Result Value Ref Range   Color, Urine AMBER (A) YELLOW    Comment: BIOCHEMICALS MAY BE AFFECTED BY COLOR   APPearance CLEAR (A) CLEAR   Specific Gravity, Urine 1.015 1.005 - 1.030   pH 6.0 5.0 - 8.0   Glucose, UA NEGATIVE NEGATIVE mg/dL   Hgb urine dipstick MODERATE (A) NEGATIVE   Bilirubin Urine NEGATIVE NEGATIVE   Ketones, ur NEGATIVE NEGATIVE mg/dL   Protein, ur NEGATIVE NEGATIVE mg/dL   Nitrite NEGATIVE NEGATIVE   Leukocytes, UA SMALL (A) NEGATIVE   RBC / HPF 6-30 0 - 5 RBC/hpf   WBC, UA 0-5 0 - 5 WBC/hpf   Bacteria, UA RARE (A) NONE SEEN   Squamous Epithelial / LPF 0-5 (A) NONE SEEN   Mucous PRESENT   Ammonia     Status: Abnormal   Collection Time: 08/03/16  3:23 AM  Result Value Ref Range   Ammonia 43 (H) 9 - 35 umol/L  Comprehensive metabolic panel     Status: Abnormal   Collection Time: 08/03/16  3:24 AM  Result Value Ref Range   Sodium 132 (L) 135 - 145 mmol/L   Potassium 4.6 3.5 - 5.1 mmol/L   Chloride 101 101 - 111 mmol/L   CO2 23 22 - 32 mmol/L   Glucose, Bld 135 (H) 65 - 99 mg/dL   BUN 17 6 - 20 mg/dL   Creatinine, Ser 0.72 0.61 - 1.24 mg/dL   Calcium 8.7 (L) 8.9 - 10.3 mg/dL   Total Protein 8.6 (H) 6.5 - 8.1 g/dL   Albumin 2.4 (L) 3.5 - 5.0 g/dL   AST 55 (H) 15 - 41 U/L   ALT 18 17 - 63 U/L   Alkaline Phosphatase 98 38 - 126 U/L   Total Bilirubin 5.9 (H) 0.3 - 1.2 mg/dL   GFR calc non Af Amer >60 >60 mL/min   GFR calc Af Amer >60 >60 mL/min    Comment: (NOTE) The eGFR has been calculated using the CKD EPI equation. This calculation has not been validated in all clinical  situations. eGFR's persistently <60 mL/min signify possible Chronic Kidney Disease.  Anion gap 8 5 - 15  Troponin I     Status: None   Collection Time: 08/03/16  3:24 AM  Result Value Ref Range   Troponin I <0.03 <0.03 ng/mL  Lipase, blood     Status: None   Collection Time: 08/03/16  3:24 AM  Result Value Ref Range   Lipase 44 11 - 51 U/L  CBC with Differential     Status: Abnormal   Collection Time: 08/03/16  3:24 AM  Result Value Ref Range   WBC 9.7 3.8 - 10.6 K/uL   RBC 3.39 (L) 4.40 - 5.90 MIL/uL   Hemoglobin 8.9 (L) 13.0 - 18.0 g/dL   HCT 28.4 (L) 40.0 - 52.0 %   MCV 83.9 80.0 - 100.0 fL   MCH 26.2 26.0 - 34.0 pg   MCHC 31.2 (L) 32.0 - 36.0 g/dL   RDW 22.1 (H) 11.5 - 14.5 %   Platelets 214 150 - 440 K/uL   Neutrophils Relative % 88 %   Neutro Abs 8.5 (H) 1.4 - 6.5 K/uL   Lymphocytes Relative 7 %   Lymphs Abs 0.7 (L) 1.0 - 3.6 K/uL   Monocytes Relative 4 %   Monocytes Absolute 0.4 0.2 - 1.0 K/uL   Eosinophils Relative 1 %   Eosinophils Absolute 0.1 0 - 0.7 K/uL   Basophils Relative 0 %   Basophils Absolute 0.0 0 - 0.1 K/uL  Culture, blood (routine x 2)     Status: None (Preliminary result)   Collection Time: 08/03/16  3:24 AM  Result Value Ref Range   Specimen Description BLOOD RIGHT FOREARM    Special Requests      BOTTLES DRAWN AEROBIC AND ANAEROBIC Blood Culture results may not be optimal due to an excessive volume of blood received in culture bottles   Culture NO GROWTH 1 DAY    Report Status PENDING   Culture, blood (routine x 2)     Status: None (Preliminary result)   Collection Time: 08/03/16  3:24 AM  Result Value Ref Range   Specimen Description BLOOD LEFT ANTECUBITAL    Special Requests      BOTTLES DRAWN AEROBIC AND ANAEROBIC Blood Culture adequate volume   Culture NO GROWTH 1 DAY    Report Status PENDING   Blood gas, arterial ([facility] & [facility] ONLY)     Status: Abnormal   Collection Time: 08/03/16  3:42 AM  Result Value Ref Range   FIO2 0.28     Delivery systems NO CHARGE    pH, Arterial 7.49 (H) 7.350 - 7.450   pCO2 arterial 28 (L) 32.0 - 48.0 mmHg   pO2, Arterial 64 (L) 83.0 - 108.0 mmHg   Bicarbonate 21.3 20.0 - 28.0 mmol/L   Acid-base deficit 1.5 0.0 - 2.0 mmol/L   O2 Saturation 93.8 %   Patient temperature 37.0    Collection site RIGHT RADIAL    Sample type ARTERIAL DRAW    Allens test (pass/fail) NO CHARGE (A) PASS  Lactic acid, plasma     Status: Abnormal   Collection Time: 08/03/16  6:40 AM  Result Value Ref Range   Lactic Acid, Venous 3.1 (HH) 0.5 - 1.9 mmol/L    Comment: CRITICAL RESULT CALLED TO, READ BACK BY AND VERIFIED WITH JUANETTE MILES ON 08/03/16 AT 0731 BY [facility]   Protime-INR     Status: Abnormal   Collection Time: 08/03/16  6:41 AM  Result Value Ref Range   Prothrombin Time 22.5 (H) 11.4 - 15.2 seconds  INR 1.95   APTT     Status: Abnormal   Collection Time: 08/03/16  6:41 AM  Result Value Ref Range   aPTT 37 (H) 24 - 36 seconds    Comment:        IF BASELINE aPTT IS ELEVATED, SUGGEST PATIENT RISK ASSESSMENT BE USED TO DETERMINE APPROPRIATE ANTICOAGULANT THERAPY.   TSH     Status: None   Collection Time: 08/03/16  7:59 AM  Result Value Ref Range   TSH 1.191 0.350 - 4.500 uIU/mL    Comment: Performed by a 3rd Generation assay with a functional sensitivity of <=0.01 uIU/mL.  Body fluid cell count with differential     Status: Abnormal   Collection Time: 08/03/16  2:21 PM  Result Value Ref Range   Fluid Type-FCT CYTOPERI    Color, Fluid YELLOW YELLOW   Appearance, Fluid CLEAR (A) CLEAR   WBC, Fluid 404 cu mm   Neutrophil Count, Fluid 27 %   Lymphs, Fluid 53 %   Monocyte-Macrophage-Serous Fluid 19 %   Eos, Fluid 1 %  Body fluid culture     Status: None (Preliminary result)   Collection Time: 08/03/16  2:21 PM  Result Value Ref Range   Specimen Description PERITONEAL    Special Requests NONE    Gram Stain      ABUNDANT WBC PRESENT, PREDOMINANTLY MONONUCLEAR NO ORGANISMS SEEN    Culture       NO GROWTH < 24 HOURS Performed at [facility], [address], [place], [place] [address]    Report Status PENDING   Protein, pleural or peritoneal fluid     Status: None   Collection Time: 08/03/16  2:21 PM  Result Value Ref Range   Total protein, fluid <3.0 g/dL    Comment: (NOTE) No normal range established for this test Results should be evaluated in conjunction with serum values    Fluid Type-FTP CYTOPERI   Glucose, pleural or peritoneal fluid     Status: None   Collection Time: 08/03/16  2:21 PM  Result Value Ref Range   Glucose, Fluid 141 mg/dL    Comment: (NOTE) No normal range established for this test Results should be evaluated in conjunction with serum values    Fluid Type-FGLU CYTOPERI   Pathologist smear review     Status: None   Collection Time: 08/03/16  2:21 PM  Result Value Ref Range   Path Review      Paracentesis cytospin shows mixed inflammation with predominantly lymphocytes.  Dr. Luana Shu.   Comprehensive metabolic panel     Status: Abnormal   Collection Time: 08/03/16  3:59 PM  Result Value Ref Range   Sodium 133 (L) 135 - 145 mmol/L   Potassium 4.3 3.5 - 5.1 mmol/L   Chloride 103 101 - 111 mmol/L   CO2 25 22 - 32 mmol/L   Glucose, Bld 152 (H) 65 - 99 mg/dL   BUN 18 6 - 20 mg/dL   Creatinine, Ser 0.87 0.61 - 1.24 mg/dL   Calcium 8.2 (L) 8.9 - 10.3 mg/dL   Total Protein 7.6 6.5 - 8.1 g/dL   Albumin 2.1 (L) 3.5 - 5.0 g/dL   AST 46 (H) 15 - 41 U/L   ALT 16 (L) 17 - 63 U/L   Alkaline Phosphatase 82 38 - 126 U/L   Total Bilirubin 5.6 (H) 0.3 - 1.2 mg/dL   GFR calc non Af Amer >60 >60 mL/min   GFR calc Af Amer >60 >  60 mL/min    Comment: (NOTE) The eGFR has been calculated using the CKD EPI equation. This calculation has not been validated in all clinical situations. eGFR's persistently <60 mL/min signify possible Chronic Kidney Disease.    Anion gap 5 5 - 15  CBC     Status: Abnormal   Collection Time: 08/04/16  6:04 AM  Result Value  Ref Range   WBC 10.7 (H) 3.8 - 10.6 K/uL   RBC 2.90 (L) 4.40 - 5.90 MIL/uL   Hemoglobin 7.6 (L) 13.0 - 18.0 g/dL   HCT 24.1 (L) 40.0 - 52.0 %   MCV 83.1 80.0 - 100.0 fL   MCH 26.0 26.0 - 34.0 pg   MCHC 31.3 (L) 32.0 - 36.0 g/dL   RDW 21.9 (H) 11.5 - 14.5 %   Platelets 163 150 - 440 K/uL  Ammonia     Status: Abnormal   Collection Time: 08/04/16  9:04 AM  Result Value Ref Range   Ammonia 54 (H) 9 - 35 umol/L   No components found for: ESR, C REACTIVE PROTEIN MICRO: Recent Results (from the past 720 hour(s))  Body fluid culture     Status: None   Collection Time: 07/14/16  1:07 PM  Result Value Ref Range Status   Specimen Description PERITONEAL  Final   Special Requests NONE  Final   Gram Stain   Final    MODERATE WBC PRESENT, PREDOMINANTLY PMN NO ORGANISMS SEEN    Culture   Final    NO GROWTH 3 DAYS Performed at [facility], [address], [place], [place] [address]    Report Status 07/18/2016 FINAL  Final  CULTURE, BLOOD (ROUTINE X 2) w Reflex to ID Panel     Status: None   Collection Time: 07/14/16  8:30 PM  Result Value Ref Range Status   Specimen Description BLOOD R HAND  Final   Special Requests Blood Culture adequate volume  Final   Culture NO GROWTH 5 DAYS  Final   Report Status 07/19/2016 FINAL  Final  CULTURE, BLOOD (ROUTINE X 2) w Reflex to ID Panel     Status: None   Collection Time: 07/14/16  8:36 PM  Result Value Ref Range Status   Specimen Description BLOOD L AC  Final   Special Requests Blood Culture adequate volume  Final   Culture NO GROWTH 5 DAYS  Final   Report Status 07/19/2016 FINAL  Final  Culture, blood (routine x 2)     Status: None (Preliminary result)   Collection Time: 08/03/16  3:24 AM  Result Value Ref Range Status   Specimen Description BLOOD RIGHT FOREARM  Final   Special Requests   Final    BOTTLES DRAWN AEROBIC AND ANAEROBIC Blood Culture results may not be optimal due to an excessive volume of blood received in culture bottles    Culture NO GROWTH 1 DAY  Final   Report Status PENDING  Incomplete  Culture, blood (routine x 2)     Status: None (Preliminary result)   Collection Time: 08/03/16  3:24 AM  Result Value Ref Range Status   Specimen Description BLOOD LEFT ANTECUBITAL  Final   Special Requests   Final    BOTTLES DRAWN AEROBIC AND ANAEROBIC Blood Culture adequate volume   Culture NO GROWTH 1 DAY  Final   Report Status PENDING  Incomplete  Body fluid culture     Status: None (Preliminary result)   Collection Time: 08/03/16  2:21 PM  Result  Value Ref Range Status   Specimen Description PERITONEAL  Final   Special Requests NONE  Final   Gram Stain   Final    ABUNDANT WBC PRESENT, PREDOMINANTLY MONONUCLEAR NO ORGANISMS SEEN    Culture   Final    NO GROWTH < 24 HOURS Performed at [facility], [address], [place], [place] [address]    Report Status PENDING  Incomplete    IMAGING: Dg Chest 1 View  Result Date: 07/14/2016 CLINICAL DATA:  Increasing shortness of breath, increasing ascites. Recently diagnosis with malignancy of the liver and has not begun treatment. EXAM: CHEST 1 VIEW COMPARISON:  Portable chest x-ray of June 22, 2016 FINDINGS: The lungs are less well inflated today. The interstitial markings remain increased. The cardiac silhouette is enlarged. The central pulmonary vascularity is prominent. There is calcification in the wall of the aortic arch. The bony thorax exhibits no acute abnormality. IMPRESSION: Mild pulmonary interstitial edema accentuated by mild hypoinflation. Mild cardiomegaly. No acute pneumonia. Thoracic aortic atherosclerosis. Electronically Signed   By: David  Martinique M.D.   On: 07/14/2016 09:51   US Abdomen Limited  Result Date: 07/18/2016 CLINICAL DATA:  61 year old male with cirrhosis, hepatocellular carcinoma and recurrent non malignant ascites. Most recent paracentesis performed 07/14/2016. Evaluate for recurrent fluid. EXAM: US ABDOMEN LIMITED - RIGHT UPPER  QUADRANT COMPARISON:  Ultrasound paracentesis 07/14/2016 FINDINGS: Sonographic interrogation of the abdomen demonstrates a small to moderate volume of ascites. Morphologic changes of hepatic cirrhosis and splenomegaly consistent with portal hypertension. IMPRESSION: Recurrent ascites, currently small to moderate in volume. Electronically Signed   By: Jacqulynn Cadet M.D.   On: 07/18/2016 08:40   US Biopsy  Result Date: 07/10/2016 CLINICAL DATA:  Cirrhosis, hepatitis-C and liver masses in right and left lobes. Ascites present by prior imaging. EXAM: 1. ULTRASOUND-GUIDED PARACENTESIS 2. ULTRASOUND GUIDED CORE BIOPSY OF LIVER MASS MEDICATIONS: 1.0 mg IV Versed; 50 mcg IV Fentanyl Total Moderate Sedation Time: 19 minutes. The patient's level of consciousness and physiologic status were continuously monitored during the procedure by Radiology nursing. PROCEDURE: The procedure, risks, benefits, and alternatives were explained to the patient. Questions regarding the procedure were encouraged and answered. The patient understands and consents to the procedure. A time out was performed prior to initiating the procedure. The abdominal wall was prepped with chlorhexidine in a sterile fashion, and a sterile drape was applied covering the operative field. A sterile gown and sterile gloves were used for the procedure. Local anesthesia was provided with 1% Lidocaine. Ultrasound was performed of the abdomen. Initial paracentesis was performed to reduce bleeding risk of liver biopsy. After localizing ascites in the right lower quadrant, a 6 French Safe-T-Centesis catheter was inserted into the peritoneal cavity. Paracentesis was performed utilizing vacuum bottles. A fluid sample was sent for cytologic analysis. Mass lesion within the right lobe of the liver was localized by ultrasound. Under direct ultrasound guidance, a 17 gauge needle was advanced into the right lobe of the liver. Two separate coaxial 18 gauge core biopsy  samples were obtained and submitted in formalin. As the outer needle was retracted, a Gel-Foam slurry was injected. COMPLICATIONS: None. FINDINGS: Moderate ascites is present in the peritoneal cavity, increased in volume since prior ultrasound imaging on 06/22/2016. Fluid is seen surrounding the liver. In order to decrease bleeding risk during liver biopsy, paracentesis was performed prior to planned liver biopsy. Clear, yellowish fluid was obtained during paracentesis. Ultrasound shows minimal fluid remain after paracentesis. Infiltrative, ill-defined heterogeneous mass is seen  throughout much of the right lobe of the liver. Solid tissue was obtained. IMPRESSION: Ultrasound-guided paracentesis and liver biopsy. Paracentesis yielded 2.2 L of fluid with a sample sent for cytologic analysis. Core biopsy of the liver was performed at the level of the large heterogeneous mass occupying much of the right lobe of the liver. Electronically Signed   By: Aletta Edouard M.D.   On: 07/10/2016 10:23   US Paracentesis  Result Date: 08/03/2016 INDICATION: Hepatocellular carcinoma, hepatitis-C, cirrhosis, recurrent ascites. Request made for diagnostic and therapeutic paracentesis. EXAM: ULTRASOUND GUIDED DIAGNOSTIC AND THERAPEUTIC PARACENTESIS MEDICATIONS: None. COMPLICATIONS: None immediate. PROCEDURE: Informed written consent was obtained from the patient after a discussion of the risks, benefits and alternatives to treatment. A timeout was performed prior to the initiation of the procedure. Initial ultrasound scanning demonstrates a small to moderate amount of ascites within the right lower abdominal quadrant. The right lower abdomen was prepped and draped in the usual sterile fashion. 1% lidocaine was used for local anesthesia. Following this, a Yueh catheter was introduced. An ultrasound image was saved for documentation purposes. The paracentesis was performed. The catheter was removed and a dressing was applied. The  patient tolerated the procedure well without immediate post procedural complication. FINDINGS: A total of approximately 2.6 liters of yellow fluid was removed. Samples were sent to the laboratory as requested by the clinical team. IMPRESSION: Successful ultrasound-guided diagnostic and therapeutic paracentesis yielding 2.6 liters of peritoneal fluid. Read by: Rowe Robert, PA-C Electronically Signed   By: Sandi Mariscal M.D.   On: 08/03/2016 14:51   US Paracentesis  Result Date: 07/14/2016 INDICATION: Abdominal distension, hepatitis-C, cirrhosis, hepatic mass EXAM: ULTRASOUND GUIDED RIGHT PARACENTESIS MEDICATIONS: 1% LIDOCAINE LOCALLY COMPLICATIONS: None immediate. PROCEDURE: Informed written consent was obtained from the patient after a discussion of the risks, benefits and alternatives to treatment. A timeout was performed prior to the initiation of the procedure. Initial ultrasound scanning demonstrates a large amount of ascites within the right lower abdominal quadrant. The right lower abdomen was prepped and draped in the usual sterile fashion. 1% lidocaine with epinephrine was used for local anesthesia. Following this, a 6 Fr Safe-T-Centesis catheter was introduced. An ultrasound image was saved for documentation purposes. The paracentesis was performed. The catheter was removed and a dressing was applied. The patient tolerated the procedure well without immediate post procedural complication. FINDINGS: A total of approximately 3.3 L of CLEAR PERITONEAL fluid was removed. Samples were sent to the laboratory as requested by the clinical team. IMPRESSION: Successful ultrasound-guided paracentesis yielding 3.3 liters of peritoneal fluid. Electronically Signed   By: Jerilynn Mages.  Shick M.D.   On: 07/14/2016 14:08   US Paracentesis  Result Date: 07/10/2016 CLINICAL DATA:  Cirrhosis, hepatitis-C and liver masses in right and left lobes. Ascites present by prior imaging. EXAM: 1. ULTRASOUND-GUIDED PARACENTESIS 2.  ULTRASOUND GUIDED CORE BIOPSY OF LIVER MASS MEDICATIONS: 1.0 mg IV Versed; 50 mcg IV Fentanyl Total Moderate Sedation Time: 19 minutes. The patient's level of consciousness and physiologic status were continuously monitored during the procedure by Radiology nursing. PROCEDURE: The procedure, risks, benefits, and alternatives were explained to the patient. Questions regarding the procedure were encouraged and answered. The patient understands and consents to the procedure. A time out was performed prior to initiating the procedure. The abdominal wall was prepped with chlorhexidine in a sterile fashion, and a sterile drape was applied covering the operative field. A sterile gown and sterile gloves were used for the procedure. Local anesthesia was provided with 1%  Lidocaine. Ultrasound was performed of the abdomen. Initial paracentesis was performed to reduce bleeding risk of liver biopsy. After localizing ascites in the right lower quadrant, a 6 French Safe-T-Centesis catheter was inserted into the peritoneal cavity. Paracentesis was performed utilizing vacuum bottles. A fluid sample was sent for cytologic analysis. Mass lesion within the right lobe of the liver was localized by ultrasound. Under direct ultrasound guidance, a 17 gauge needle was advanced into the right lobe of the liver. Two separate coaxial 18 gauge core biopsy samples were obtained and submitted in formalin. As the outer needle was retracted, a Gel-Foam slurry was injected. COMPLICATIONS: None. FINDINGS: Moderate ascites is present in the peritoneal cavity, increased in volume since prior ultrasound imaging on 06/22/2016. Fluid is seen surrounding the liver. In order to decrease bleeding risk during liver biopsy, paracentesis was performed prior to planned liver biopsy. Clear, yellowish fluid was obtained during paracentesis. Ultrasound shows minimal fluid remain after paracentesis. Infiltrative, ill-defined heterogeneous mass is seen throughout  much of the right lobe of the liver. Solid tissue was obtained. IMPRESSION: Ultrasound-guided paracentesis and liver biopsy. Paracentesis yielded 2.2 L of fluid with a sample sent for cytologic analysis. Core biopsy of the liver was performed at the level of the large heterogeneous mass occupying much of the right lobe of the liver. Electronically Signed   By: Aletta Edouard M.D.   On: 07/10/2016 10:23   Dg Chest Portable 1 View  Result Date: 08/03/2016 CLINICAL DATA:  Acute onset of shortness of breath EXAM: PORTABLE CHEST 1 VIEW COMPARISON:  07/14/2016 FINDINGS: Low lung volumes. Borderline cardiomegaly with mild central vascular congestion. Small bilateral effusions right greater than left. Worsening atelectasis or infiltrate at the right base. Atherosclerosis. No pneumothorax. Old right clavicle fracture. IMPRESSION: Low lung volumes with small bilateral right greater than left pleural effusions and increasing right basilar atelectasis or infiltrate. Electronically Signed   By: Donavan Foil M.D.   On: 08/03/2016 03:50    Assessment:   TYSHON FANNING is a 61 y.o. male with cirrhosis, [place] admitted with sob and pain all over. He has had some fevers and has a R post calf ulcer and some surrounding cellulitis. No evidence of SBP.  He is in poor shape and per note from pallaitive care 5/18 is hospice eligible.  I think the main issue is his edema LE and the wound which has mild infection  Recommendations I cultured wound today Can cont ceftraixone Would elevate legs as much as possible.  If improving can place unawrap at DC Thank you very much for allowing me to participate in the care of this patient. Please call with questions.   Cheral Marker. Ola Spurr, MD

## 2016-08-05 ENCOUNTER — Inpatient Hospital Stay: Payer: 59

## 2016-08-05 DIAGNOSIS — A419 Sepsis, unspecified organism: Principal | ICD-10-CM

## 2016-08-05 DIAGNOSIS — C220 Liver cell carcinoma: Secondary | ICD-10-CM

## 2016-08-05 DIAGNOSIS — R188 Other ascites: Secondary | ICD-10-CM

## 2016-08-05 DIAGNOSIS — Z7189 Other specified counseling: Secondary | ICD-10-CM

## 2016-08-05 DIAGNOSIS — Z515 Encounter for palliative care: Secondary | ICD-10-CM

## 2016-08-05 LAB — MISC LABCORP TEST (SEND OUT): Labcorp test code: 19588

## 2016-08-05 LAB — HEMOGLOBIN A1C
Hgb A1c MFr Bld: 5 % (ref 4.8–5.6)
Mean Plasma Glucose: 97 mg/dL

## 2016-08-05 LAB — AMMONIA: Ammonia: 41 umol/L — ABNORMAL HIGH (ref 9–35)

## 2016-08-05 NOTE — Progress Notes (Signed)
Called orderly to obtain supplies for unna boots.  She was unable to locate and I am unable to leave the floor at this time. Dorna Bloom RN

## 2016-08-05 NOTE — Progress Notes (Signed)
[facility] [place] NAME: Donald Bowers    MR#:  [id number]  DATE OF BIRTH:  1955/07/09  SUBJECTIVE:  CHIEF COMPLAINT: Pt is sob. Had paracentesis yesterday, 2 bms yesterday Still fluid is leaking from the paracentesis site, no fever today  REVIEW OF SYSTEMS:  Review of systems Limited as the patient is sob CONSTITUTIONAL: Had low-grade fever, reports fatigue or weakness.  RESPIRATORY: No cough, patient has shortness of breath, no wheezing or hemoptysis.  CARDIOVASCULAR: No chest pain, orthopnea, edema.  GASTROINTESTINAL: distended No nausea, vomiting, diarrhea  NEUROLOGIC:  answering questions, more alert than yesterday PSYCHIATRY: No anxiety or depression.   DRUG ALLERGIES:  No Known Allergies  VITALS:  Blood pressure 99/61, pulse 99, temperature 97.8 F (36.6 C), temperature source Oral, resp. rate 17, height 6' (1.829 m), weight 97.5 kg (214 lb 14.4 oz), SpO2 100 %.  PHYSICAL EXAMINATION:  GENERAL:  61 y.o.-year-old patient lying in the bed with no acute distress. Jaundiced EYES: Pupils equal, round, reactive to light and accommodation. Positive scleral icterus. Extraocular muscles intact.  HEENT: Head atraumatic, normocephalic. Oropharynx and nasopharynx clear.  NECK:  Supple, no jugular venous distention. No thyroid enlargement, no tenderness.  LUNGS: Diminished lower lung breath sounds bilaterally, no wheezing, rales,rhonchi or crepitation. No use of accessory muscles of respiration.  CARDIOVASCULAR: S1, S2 tachycardic. No murmurs, rubs, or gallops.  ABDOMEN: Soft, Less distended. Status post paracentesis 08/03/2016 ,Acetic fluid is leaking from the paracentesis site Bowel sounds present.  EXTREMITIES: + pedal edema, NO cyanosis, or clubbing.  NEUROLOGIC: Patient is more alert today and answers questions  PSYCHIATRIC: The patient is more alert today SKIN: No obvious rash, lesion, or ulcer.    LABORATORY PANEL:    CBC  Recent Labs Lab 08/04/16 0604  WBC 10.7*  HGB 7.6*  HCT 24.1*  PLT 163   ------------------------------------------------------------------------------------------------------------------  Chemistries   Recent Labs Lab 08/03/16 1559  NA 133*  K 4.3  CL 103  CO2 25  GLUCOSE 152*  BUN 18  CREATININE 0.87  CALCIUM 8.2*  AST 46*  ALT 16*  ALKPHOS 82  BILITOT 5.6*   ------------------------------------------------------------------------------------------------------------------  Cardiac Enzymes  Recent Labs Lab 08/03/16 0324  TROPONINI <0.03   ------------------------------------------------------------------------------------------------------------------  RADIOLOGY:  US Paracentesis  Result Date: 08/03/2016 INDICATION: Hepatocellular carcinoma, hepatitis-C, cirrhosis, recurrent ascites. Request made for diagnostic and therapeutic paracentesis. EXAM: ULTRASOUND GUIDED DIAGNOSTIC AND THERAPEUTIC PARACENTESIS MEDICATIONS: None. COMPLICATIONS: None immediate. PROCEDURE: Informed written consent was obtained from the patient after a discussion of the risks, benefits and alternatives to treatment. A timeout was performed prior to the initiation of the procedure. Initial ultrasound scanning demonstrates a small to moderate amount of ascites within the right lower abdominal quadrant. The right lower abdomen was prepped and draped in the usual sterile fashion. 1% lidocaine was used for local anesthesia. Following this, a Yueh catheter was introduced. An ultrasound image was saved for documentation purposes. The paracentesis was performed. The catheter was removed and a dressing was applied. The patient tolerated the procedure well without immediate post procedural complication. FINDINGS: A total of approximately 2.6 liters of yellow fluid was removed. Samples were sent to the laboratory as requested by the clinical team. IMPRESSION: Successful ultrasound-guided diagnostic and  therapeutic paracentesis yielding 2.6 liters of peritoneal fluid. Read by: Rowe Robert, PA-C Electronically Signed   By: Sandi Mariscal M.D.   On: 08/03/2016 14:51    EKG:   Orders placed or performed during the hospital  encounter of 08/03/16  . EKG 12-Lead  . EKG 12-Lead    ASSESSMENT AND PLAN:   1. Sepsis: The patient's criteria via tachycardia and tachypnea.   The patient has received broad-spectrum antibiotics in the emergency department.   The patient has been on Cipro prophylactically.  Rocephin 2 g Iv daily  Blood cultures are negative.  fluid from paracentesis with no growth. Abdomen and WBC were present Wound culture with few yeast and gram-positive cocci  2. Ascites: Secondary to cirrhosis Status post ultrasound-guided paracentesis on June 3 and had 2.6 L of fluid extraction -Patient is still having leakage of ascitic fluid from the 6/3 paracentesis site, will repeat ultrasound guided paracentesis today  Continue Lasix and spironolactone  Check ammonia level  3. Respiratory alkalosis: Chronic; secondary to liver disease. Work of breathing has decreased. Patient is still alert. Expect shortness of breath to improve following paracentesis.  4. Failure to thrive Patient is currently full code with home hospice palliative care is consulted regarding goals of care  4. DVT prophylaxis: SCDs  5. GI prophylaxis: Pantoprazole per home regimen   Poor prognosis with high risk for cardiopulmonary arrest  All the records are reviewed and case discussed with Care Management/Social Workerr. Management plans discussed with the patient, patient's son  [phone] with the healthcare power of attorney , he will discuss with other family members regarding patient's CODE STATUS  CODE STATUS: fc   TOTAL TIME TAKING CARE OF THIS PATIENT: 35 minutes.   POSSIBLE D/C IN 2  DAYS, DEPENDING ON CLINICAL CONDITION.  Note: This dictation was prepared with Dragon dictation along with  smaller phrase technology. Any transcriptional errors that result from this process are unintentional.   Nicholes Mango M.D on 08/05/2016 at 10:05 AM  Between 7am to 6pm - Pager - [phone] After 6pm go to [email or web address] - password [email or web address] [facility]  Office  [phone]  CC: Primary care physician; Dion Body, MD

## 2016-08-05 NOTE — Progress Notes (Signed)
Visit made. Patient seen sleeping soundly, appears jaundice and pale. Drainage from leg wound noted to sheet under right leg, staff RN Skyler made aware. Lower edema present.  Per chart note review patient went for a paracentesis this morning, there was not enough fluid to drain. He has required one dose of IV morphine this morning for pain. He continues on IV antibiotics. SBP has been ruled out, leg wound culture pending. Per discussion with Palliative NP Ihor Dow a family meeting is planned for tomorrow. Will continue to follow and update [facility] team. Thank you. Flo Shanks RN, BSN, [facility]. Hospital Liaison [phone] c

## 2016-08-05 NOTE — Progress Notes (Signed)
Patient presented to radiology department for US guided paracentesis.  He recently had paracentesis 6/3 with 2.6 liters removed.  Limited US Abdomen performed and demonstrates minimal amount of free fluid not amenable to paracentesis.  No procedure performed. Patient returned to unit.   Brynda Greathouse, MMS RDN PA-C 12:08 PM

## 2016-08-05 NOTE — Progress Notes (Signed)
[facility] INFECTIOUS DISEASE PROGRESS NOTE Date of Admission:  08/03/2016     ID: Donald Bowers is a 61 y.o. male with RLE woudn and fever  Active Problems:   Sepsis ([place])   Subjective: Still with leg pain   ROS  Eleven systems are reviewed and negative except per hpi  Medications:  Antibiotics Given (last 72 hours)    Date/Time Action Medication Dose Rate   08/03/16 0352 New Bag/Given   piperacillin-tazobactam (ZOSYN) IVPB 3.375 g 3.375 g 100 mL/hr   08/03/16 0831 Given   ciprofloxacin (CIPRO) tablet 500 mg 500 mg    08/03/16 0930 New Bag/Given  [not on floor notified pharmacy]   vancomycin (VANCOCIN) IVPB 1000 mg/200 mL premix 1,000 mg 200 mL/hr   08/03/16 1529 New Bag/Given  [off unit]   cefTRIAXone (ROCEPHIN) 1 g in dextrose 5 % 50 mL IVPB 1 g 100 mL/hr   08/04/16 1038 Given   ciprofloxacin (CIPRO) tablet 500 mg 500 mg    08/04/16 1039 New Bag/Given   cefTRIAXone (ROCEPHIN) 2 g in dextrose 5 % 50 mL IVPB 2 g 100 mL/hr   08/05/16 0826 New Bag/Given   cefTRIAXone (ROCEPHIN) 2 g in dextrose 5 % 50 mL IVPB 2 g 100 mL/hr     . docusate sodium  100 mg Oral BID  . feeding supplement (ENSURE ENLIVE)  237 mL Oral TID BM  . folic acid  1 mg Oral Daily  . furosemide  40 mg Oral Daily  . lactulose  10 g Oral BID  . mouth rinse  15 mL Mouth Rinse BID  . multivitamin with minerals  1 tablet Oral Daily  . pantoprazole  40 mg Oral Daily  . spironolactone  25 mg Oral Daily  . sucralfate  1 g Oral TID WC & HS    Objective: Vital signs in last 24 hours: Temp:  [97.8 F (36.6 C)-99.8 F (37.7 C)] 97.9 F (36.6 C) (06/05 1345) Pulse Rate:  [90-125] 90 (06/05 1345) Resp:  [16-17] 16 (06/05 1345) BP: (99-115)/(58-79) 112/79 (06/05 1345) SpO2:  [99 %-100 %] 100 % (06/05 1345) Weight:  [97.5 kg (214 lb 14.4 oz)] 97.5 kg (214 lb 14.4 oz) (06/05 0500) Constitutional: chronically ill appearing. Sitting up in chair with legs on ground HENT: jaundiced,  Mouth/Throat:  Oropharynx is clear and moist. No oropharyngeal exudate.  Cardiovascular: Normal rate, regular rhythm and normal heart sounds. Pulmonary/Chest: Effort normal and breath sounds normal. No respiratory distress. He has no wheezes.  Abdominal: distended, mild diffuse ttp Lymphadenopathy: He has no cervical adenopathy.  Neurological: disoriented EXT 3+ Bil LE edema to upper shins Skin: bil LE with chronci stasis dermatitis changes, L post calf with skin breakdown and area of weeping fluid Psychiatric: disoriented   Lab Results  Recent Labs  08/03/16 0324 08/03/16 1559 08/04/16 0604  WBC 9.7  --  10.7*  HGB 8.9*  --  7.6*  HCT 28.4*  --  24.1*  NA 132* 133*  --   K 4.6 4.3  --   CL 101 103  --   CO2 23 25  --   BUN 17 18  --   CREATININE 0.72 0.87  --     Microbiology: Results for orders placed or performed during the hospital encounter of 08/03/16  Culture, blood (routine x 2)     Status: None (Preliminary result)   Collection Time: 08/03/16  3:24 AM  Result Value Ref Range Status   Specimen Description BLOOD RIGHT  FOREARM  Final   Special Requests   Final    BOTTLES DRAWN AEROBIC AND ANAEROBIC Blood Culture results may not be optimal due to an excessive volume of blood received in culture bottles   Culture NO GROWTH 2 DAYS  Final   Report Status PENDING  Incomplete  Culture, blood (routine x 2)     Status: None (Preliminary result)   Collection Time: 08/03/16  3:24 AM  Result Value Ref Range Status   Specimen Description BLOOD LEFT ANTECUBITAL  Final   Special Requests   Final    BOTTLES DRAWN AEROBIC AND ANAEROBIC Blood Culture adequate volume   Culture NO GROWTH 2 DAYS  Final   Report Status PENDING  Incomplete  Body fluid culture     Status: None (Preliminary result)   Collection Time: 08/03/16  2:21 PM  Result Value Ref Range Status   Specimen Description PERITONEAL  Final   Special Requests NONE  Final   Gram Stain   Final    ABUNDANT WBC PRESENT, PREDOMINANTLY  MONONUCLEAR NO ORGANISMS SEEN    Culture   Final    NO GROWTH 2 DAYS Performed at [facility], [address], [place], [place] [address]    Report Status PENDING  Incomplete  Aerobic Culture (superficial specimen)     Status: None (Preliminary result)   Collection Time: 08/04/16  5:47 PM  Result Value Ref Range Status   Specimen Description WOUND  Final   Special Requests RL  Final   Gram Stain   Final    NO WBC SEEN FEW YEAST FEW GRAM POSITIVE COCCI IN PAIRS RARE GRAM VARIABLE ROD Performed at [facility], [address] [place], [place] [address]    Culture PENDING  Incomplete   Report Status PENDING  Incomplete    Studies/Results: US Abdomen Limited  Result Date: 08/05/2016 CLINICAL DATA:  Patient with history of recurrent ascites. Request is made for therapeutic paracentesis. EXAM: LIMITED ABDOMEN ULTRASOUND FOR ASCITES TECHNIQUE: Limited ultrasound survey for ascites was performed in all four abdominal quadrants. COMPARISON:  None. FINDINGS: Patient with minimal amount of fluid visualized. IMPRESSION: Patient with minimal amount of fluid not safely amenable to paracentesis. Read by:  Brynda Greathouse PA-C Electronically Signed   By: Inez Catalina M.D.   On: 08/05/2016 12:11    Assessment/Plan: Donald Bowers is a 61 y.o. male with cirrhosis, [place] admitted with sob and pain all over. He has had some fevers and has a R post calf ulcer and some surrounding cellulitis. No evidence of SBP.  He is in poor shape and per note from pallaitive care 5/18 is hospice eligible.  I think the main issue is his edema LE and the wound which has mild infection Wound cx pending.   Recommendations Can cont ceftraixone Would elevate legs as much as possible.  Will place unnawrap  Thank you very much for the consult. Will follow with you.  [place], Cotton Beckley P   08/05/2016, 2:27 PM

## 2016-08-05 NOTE — Progress Notes (Signed)
Dr. Margaretmary Eddy notified not enough fluid to perform paracentesis. Currently coordinating a time when patient's sisters (Becky & Bertram Millard) and son Krystian Younglove) can all meet with Palliative care for goals of care as soon as possible since there is no POA.

## 2016-08-05 NOTE — Progress Notes (Signed)
Brief GOC with patient and sister, Jacqlyn Larsen. Patient wishes to remain a full code at this time. Palliative meeting scheduled for tomorrow 08/06/16 at 12pm with patient, son, and two sisters. Full note to follow from today.   NO CHARGE  Ihor Dow, FNP-C Palliative Medicine Team  Phone: [phone] Fax: [phone]

## 2016-08-06 DIAGNOSIS — Z66 Do not resuscitate: Secondary | ICD-10-CM

## 2016-08-06 LAB — COMPREHENSIVE METABOLIC PANEL
ALT: 15 U/L — ABNORMAL LOW (ref 17–63)
ANION GAP: 5 (ref 5–15)
AST: 47 U/L — ABNORMAL HIGH (ref 15–41)
Albumin: 2.1 g/dL — ABNORMAL LOW (ref 3.5–5.0)
Alkaline Phosphatase: 85 U/L (ref 38–126)
BILIRUBIN TOTAL: 4.8 mg/dL — AB (ref 0.3–1.2)
BUN: 25 mg/dL — AB (ref 6–20)
CO2: 25 mmol/L (ref 22–32)
Calcium: 8.7 mg/dL — ABNORMAL LOW (ref 8.9–10.3)
Chloride: 102 mmol/L (ref 101–111)
Creatinine, Ser: 0.78 mg/dL (ref 0.61–1.24)
GFR calc Af Amer: >60 mL/min (ref >60)
Glucose, Bld: 104 mg/dL — ABNORMAL HIGH (ref 65–99)
POTASSIUM: 4.6 mmol/L (ref 3.5–5.1)
Sodium: 132 mmol/L — ABNORMAL LOW (ref 135–145)
TOTAL PROTEIN: 7.6 g/dL (ref 6.5–8.1)

## 2016-08-06 LAB — CBC
HEMATOCRIT: 24.6 % — AB (ref 40.0–52.0)
Hemoglobin: 7.8 g/dL — ABNORMAL LOW (ref 13.0–18.0)
MCH: 26 pg (ref 26.0–34.0)
MCHC: 31.5 g/dL — AB (ref 32.0–36.0)
MCV: 82.5 fL (ref 80.0–100.0)
Platelets: 173 10*3/uL (ref 150–440)
RBC: 2.99 MIL/uL — ABNORMAL LOW (ref 4.40–5.90)
RDW: 21.4 % — ABNORMAL HIGH (ref 11.5–14.5)
WBC: 7.1 10*3/uL (ref 3.8–10.6)

## 2016-08-06 MED ORDER — MORPHINE SULFATE (CONCENTRATE) 10 MG/0.5ML PO SOLN: 10.0000 mg | ORAL | Status: DC | PRN

## 2016-08-06 MED ORDER — MORPHINE SULFATE (PF) 2 MG/ML IV SOLN
2.0000 mg | INTRAVENOUS | Status: DC | PRN
  Administered 2016-08-06 – 2016-08-07 (×3): 2 mg via INTRAVENOUS
  Filled 2016-08-06 (×3): qty 1

## 2016-08-06 MED ORDER — MORPHINE SULFATE (PF) 2 MG/ML IV SOLN
2.0000 mg | INTRAVENOUS | Status: DC | PRN
  Administered 2016-08-06: 12:00:00 2 mg via INTRAVENOUS
  Filled 2016-08-06: qty 1

## 2016-08-06 MED ORDER — HALOPERIDOL LACTATE 5 MG/ML IJ SOLN: 2.0000 mg | Freq: Four times a day (QID) | INTRAMUSCULAR | Status: DC | PRN

## 2016-08-06 MED ORDER — LORAZEPAM 2 MG/ML IJ SOLN
1.0000 mg | INTRAMUSCULAR | Status: DC | PRN
  Administered 2016-08-07: 1 mg via INTRAVENOUS
  Filled 2016-08-06: qty 1

## 2016-08-06 MED ORDER — GUAIFENESIN-DM 100-10 MG/5ML PO SYRP
5.0000 mL | ORAL_SOLUTION | ORAL | Status: DC | PRN
  Administered 2016-08-07: 04:00:00 5 mL via ORAL
  Filled 2016-08-06: qty 5

## 2016-08-06 NOTE — Progress Notes (Signed)
Daily Progress Note   Patient Name: Donald Bowers       Date: 08/06/2016 DOB: 18-Jul-1955  Age: 61 y.o. MRN#: [id number] Attending Physician: Vaughan Basta, * Primary Care Physician: Dion Body, MD Admit Date: 08/03/2016  Reason for Consultation/Follow-up: Establishing goals of care  Subjective/Goals of Care: This NP met with patient, son, and two sisters Donald Bowers and Donald Bowers). Patient is drowsy throughout our conversation. I do not feel he has the capacity to make his own decisions nor does he fully understand the severity of his condition.   Discussed in detail diagnoses, interventions, and poor prognosis. Family has a good understanding that his cancer is terminal. They understand he is not a candidate for chemotherapy. They speak of his decline since cancer diagnosis but especially in the last three weeks with poor appetite and functional status. He has been confused and agitated at home. They feel he needs more care at home and seem to understand he is nearing the end-of-life.   Discussed transition from aggressive medical pathway to comfort approach with focus on comfort, quality, dignity, and symptom management at the end of his life. Educated on hospice options--home versus inpatient facility. Son, Donald Bowers, feels he cannot care for him at home. Family considering hospice facility as the most appropriate option.       Family is challenged with the decision of changing his code status. They do not want him to suffer at the end of his life. They understand the medical recommendation for DNR/DNI with underlying cancer and him likely not surviving this. They want Donald Bowers to make this decision but seem to understand he is not of full capacity to understand the severity of his  declining health. Explained my concern with high risk for decompensation requiring ventilation. Family requested time to consider options.    1415: Shortly after, RN notified me that family was ready for him to go to the hospice home. I followed up with family. Patient again drowsy and unable to participate in the conversation. We again discussed in detail diagnoses, poor prognosis, and need for focus on comfort. They feel hospice facility is the best option for him at the end of his life understanding time is short and when he declines, symptoms will be managed with medication. Explained that when he is transitioned to hospice facility, DNR  needs to be in place and also medications/interventions/labs not aimed at comfort will be discontinued. Educated on EOL expectations and medications as needed to control symptoms. Family still struggles with saying "yes" to DNR but all agree with not wanting him to suffer at the end of his life and feel "resuscitation would cause him pain." (I have discussed with Dr. Anselm Bowers and we have made him a DNR).  Sister, Donald Bowers, shares stories of her brother growing up. "He can fix anything." She speaks of their mother being at the hospice home for EOL and the great care she received.   Family requests to speak with hospice liaison. I have given them Hard Choices and Gone from [employer] [facility] books. Provided emotional and spiritual support.         Length of Stay: 3  Current Medications: Scheduled Meds:  . docusate sodium  100 mg Oral BID  . feeding supplement (ENSURE ENLIVE)  237 mL Oral TID BM  . folic acid  1 mg Oral Daily  . furosemide  40 mg Oral Daily  . lactulose  10 g Oral BID  . mouth rinse  15 mL Mouth Rinse BID  . multivitamin with minerals  1 tablet Oral Daily  . pantoprazole  40 mg Oral Daily  . spironolactone  25 mg Oral Daily  . sucralfate  1 g Oral TID WC & HS    Continuous Infusions: . cefTRIAXone (ROCEPHIN) 2 g IVPB Stopped (08/06/16 0841)    PRN  Meds: acetaminophen **OR** acetaminophen, guaiFENesin-dextromethorphan, morphine injection, ondansetron **OR** ondansetron (ZOFRAN) IV, oxyCODONE, traMADol  Physical Exam  Constitutional: He is easily aroused. He appears ill.  HENT:  Head: Normocephalic and atraumatic.  Eyes: Scleral icterus is present.  Cardiovascular: Regular rhythm.   Pulmonary/Chest: Accessory muscle usage present. No tachypnea. No respiratory distress. He has decreased breath sounds.  4L nasal cannula  Abdominal: He exhibits distension and ascites. There is no tenderness.  Genitourinary: Right testis shows swelling. Left testis shows swelling.  Musculoskeletal: He exhibits edema (generalized/anasarca).  Neurological: He is easily aroused.  Easily wakes to voice. Very drowsy. Pleasantly confused  Skin: Skin is warm and dry. There is pallor.  Psychiatric: His speech is delayed. He is slowed. He is inattentive.  Nursing note and vitals reviewed.          Vital Signs: BP (!) 115/59 (BP Location: Left Arm)   Pulse 98   Temp 97.8 F (36.6 C) (Oral)   Resp 18   Ht 6' (1.829 m)   Wt 97.2 kg (214 lb 4.8 oz)   SpO2 99%   BMI 29.06 kg/m  SpO2: SpO2: 99 % O2 Device: O2 Device: Nasal Cannula O2 Flow Rate: O2 Flow Rate (L/min): 4 L/min  Intake/output summary:   Intake/Output Summary (Last 24 hours) at 08/06/16 1324 Last data filed at 08/06/16 0800  Gross per 24 hour  Intake              460 ml  Output              350 ml  Net              110 ml   LBM: Last BM Date: 08/01/16 Baseline Weight: Weight: 93.9 kg (207 lb) Most recent weight: Weight: 97.2 kg (214 lb 4.8 oz)       Palliative Assessment/Data: PPS 50%   Flowsheet Rows     Most Recent Value  Intake Tab  Referral Department  Hospitalist  Unit at Time  of Referral  Oncology Unit  Palliative Care Primary Diagnosis  Cancer  Date Notified  08/05/16  Palliative Care Type  Return patient Palliative Care  Reason for referral  Clarify Goals of Care    Date of Admission  08/03/16  Date first seen by Palliative Care  08/05/16  # of days IP prior to Palliative referral  2  Clinical Assessment  Palliative Performance Scale Score  50%  Psychosocial & Spiritual Assessment  Palliative Care Outcomes  Patient/Family meeting held?  Yes  Who was at the meeting?  patient, two sisters, son  Palliative Care Outcomes  Clarified goals of care, Counseled regarding hospice, Provided psychosocial or spiritual support, Provided end of life care assistance, ACP counseling assistance      Patient Active Problem List   Diagnosis Date Noted  . Sepsis ([place]) 08/03/2016  . Alcoholic cirrhosis of liver with ascites ([place])   . Hepatocellular carcinoma ([place])   . Palliative care by specialist   . Goals of care, counseling/discussion   . SBP (spontaneous bacterial peritonitis) ([place])   . Ascitic fluid 07/14/2016  . Liver mass   . Hematemesis 06/19/2016  . Melena 06/19/2016  . COPD (chronic obstructive pulmonary disease) ([place]) 06/19/2016  . Hepatitis C 06/19/2016  . Acute hyponatremia 05/19/2016    Palliative Care Assessment & Plan   Patient Profile: 61 y.o. male  with past medical history of hepatocellular carcinoma, hepatitis C, ETOH and cocaine abuse, GI bleed, COPD, pneumonia, and arthritis admitted on 08/03/2016 with shortness of breath. In ED, chest xray reveals bilateral pleural effusions. Placed on BiPAP and given lasix and pain medication. Patient was mildly hyponatremic with elevated lactic acid. Paracentesis on 6/3 with 2.6 L removed. Receiving antibiotics for sepsis likely SBP. Off BiPAPand on 4L nasal cannula. Palliative medicine consultation for goals of care.   Assessment: Metastatic hepatocellular carcinoma Sepsis likely due to SBP Ascites Cirrhosis Respiratory alkalosis Failure to thrive  Recommendations/Plan:  DNR/DNI  Focus is comfort--will not discontinue all medications yet. If patient should decline tonight, use medications as  needed to ensure comfort.   Symptom management:  Morphine 26m IV q2h prn pain/dyspnea/air hunger  Roxanol 1379mSL q2h prn pain/dyspnea/air hunger  Ativan 79m54mV q4h prn anxiety  Haldol 2mg70m q6h prn agitation  Notified SW of request for inpatient hospice facility. Requested hospice liaison meet with family this afternoon.   Likely transfer to hospice home tomorrow if family is ready.   PMT will f/u in AM.   Code Status: DNR/DNI   Code Status Orders        Start     Ordered   08/03/16 0733  Full code  Continuous     08/03/16 0732    Code Status History    Date Active Date Inactive Code Status Order ID Comments User Context   07/14/2016 11:13 AM 07/21/2016  2:51 PM Full Code [id number] Bow ED   06/20/2016  2:55 AM 06/24/2016  3:03 PM Full Code [id number] Coon Inpatient   05/19/2016  9:16 PM 05/22/2016  3:03 PM Full Code [id number] Mandes Inpatient       Prognosis:   < 2 weeks: guarded with hepatocellular cancer, recurrent ascites, acute respiratory failure, and high risk for decompensation.  Discharge Planning:  Hospice facility  Care plan was discussed with patient, son, two sisters, RN, SW, hospice liaison, and Dr. VachAnselm Junglingank you for allowing the Palliative Medicine Team to assist in the care  of this patient.   Time In/Out: 1200-1315 1420-1510   Total Time 131mn Prolonged Time Billed yes      Greater than 50%  of this time was spent counseling and coordinating care related to the above assessment and plan.  MIhor Dow FNP-C Palliative Medicine Team  Phone: [phone]: [phone] Please contact Palliative Medicine Team phone at [phone] questions and concerns.

## 2016-08-06 NOTE — Progress Notes (Signed)
Pt lips look bloody- pt states he thinks he bit his lips over night. Cleansed and moisturized.

## 2016-08-06 NOTE — Progress Notes (Signed)
[facility] [place] NAME: Donald Bowers    MR#:  [id number]  DATE OF BIRTH:  [birth year]  SUBJECTIVE:  CHIEF COMPLAINT: Pt is sob. Had paracentesis , Still fluid is leaking from the paracentesis site, no fever today pt is drowsy. Family is in room.  REVIEW OF SYSTEMS:  Pt is lethargic and appears confused.  DRUG ALLERGIES:  No Known Allergies  VITALS:  Blood pressure (!) 115/59, pulse 98, temperature 97.8 F (36.6 C), temperature source Oral, resp. rate 18, height 6' (1.829 m), weight 97.2 kg (214 lb 4.8 oz), SpO2 99 %.  PHYSICAL EXAMINATION:  GENERAL:  61 y.o.-year-old patient lying in the bed with no acute distress. Jaundiced EYES: Pupils equal, round, reactive to light and accommodation. Positive scleral icterus. Extraocular muscles intact.  HEENT: Head atraumatic, normocephalic. Oropharynx and nasopharynx clear.  NECK:  Supple, no jugular venous distention. No thyroid enlargement, no tenderness.  LUNGS: Diminished lower lung breath sounds bilaterally, no wheezing, rales,rhonchi or crepitation. No use of accessory muscles of respiration.  CARDIOVASCULAR: S1, S2 tachycardic. No murmurs, rubs, or gallops.  ABDOMEN: Soft, Less distended. Status post paracentesis 08/03/2016 ,Acetic fluid is leaking from the paracentesis site Bowel sounds present.  EXTREMITIES: + pedal edema, NO cyanosis, or clubbing.  NEUROLOGIC: Patient is less alert today and answers some questions  PSYCHIATRIC: The patient is less alert today, lethargic SKIN: No obvious rash, lesion, or ulcer.    LABORATORY PANEL:   CBC  Recent Labs Lab 08/06/16 0352  WBC 7.1  HGB 7.8*  HCT 24.6*  PLT 173   ------------------------------------------------------------------------------------------------------------------  Chemistries   Recent Labs Lab 08/06/16 0352  NA 132*  K 4.6  CL 102  CO2 25  GLUCOSE 104*  BUN 25*  CREATININE 0.78  CALCIUM 8.7*   AST 47*  ALT 15*  ALKPHOS 85  BILITOT 4.8*   ------------------------------------------------------------------------------------------------------------------  Cardiac Enzymes  Recent Labs Lab 08/03/16 0324  TROPONINI <0.03   ------------------------------------------------------------------------------------------------------------------  RADIOLOGY:  US Abdomen Limited  Result Date: 08/05/2016 CLINICAL DATA:  Patient with history of recurrent ascites. Request is made for therapeutic paracentesis. EXAM: LIMITED ABDOMEN ULTRASOUND FOR ASCITES TECHNIQUE: Limited ultrasound survey for ascites was performed in all four abdominal quadrants. COMPARISON:  None. FINDINGS: Patient with minimal amount of fluid visualized. IMPRESSION: Patient with minimal amount of fluid not safely amenable to paracentesis. Read by:  Donald Greathouse PA-C Electronically Signed   By: Inez Catalina M.D.   On: 08/05/2016 12:11    EKG:   Orders placed or performed during the hospital encounter of 08/03/16  . EKG 12-Lead  . EKG 12-Lead    ASSESSMENT AND PLAN:   1. Sepsis: The patient's criteria via tachycardia and tachypnea.   The patient has received broad-spectrum antibiotics in the emergency department.   The patient has been on Cipro prophylactically.  Rocephin 2 g Iv daily  Blood cultures are negative.  fluid from paracentesis with no growth.  Wound culture with few yeast and gram-positive cocci  2. Ascites: Secondary to cirrhosis Status post ultrasound-guided paracentesis on June 3 and had 2.6 L of fluid extraction -Patient is still having leakage of ascitic fluid from the 6/3 paracentesis site,  Tried to tap again on 08/05/16- had minimal fluids.  Continue Lasix and spironolactone  Checked ammonia level  3. Respiratory alkalosis: Chronic; secondary to liver disease. Work of breathing has decreased. Patient is lethargic.   4. Failure to thrive Patient is currently full code with home  hospice  palliative care is consulted regarding goals of care   After discussing with multiple family members, they agreed on DNR and thinking about hospice,likely tomorrow.  4. DVT prophylaxis: SCDs  5. GI prophylaxis: Pantoprazole per home regimen   Poor prognosis with high risk for cardiopulmonary arrest  All the records are reviewed and case discussed with Care Management/Social Workerr. Management plans discussed with the patient, patient's son  [phone] with the healthcare power of attorney , he will discuss with other family members regarding patient's CODE STATUS  CODE STATUS: DNR  TOTAL TIME TAKING CARE OF THIS PATIENT: 35 minutes.   POSSIBLE D/C IN 2  DAYS, DEPENDING ON CLINICAL CONDITION.  Note: This dictation was prepared with Dragon dictation along with smaller phrase technology. Any transcriptional errors that result from this process are unintentional.   Vaughan Basta M.D on 08/06/2016 at 5:53 PM  Between 7am to 6pm - Pager - [phone] After 6pm go to [email or web address] - password [email or web address] [facility]  Office  [phone]  CC: Primary care physician; Dion Body, MD

## 2016-08-06 NOTE — Consult Note (Signed)
[facility] Nurse wound consult note Reason for Consult:Chronic venous insufficiency with nonintact lesion to right posterior lower leg.  Cellulitis present.  Will apply Unnas boots to promote healing and manage edema.  Wound type:Chronic venous/cellulitis Pressure Injury POA: N/A Measurement: 2.2 cm x 2 cm x 0.2 cm  Wound BMZ:TAEW and moist Drainage (amount, consistency, odor) minimal serous weeping.  No odor.  Periwound:Chronic skin changes to bilateral lower legs.  Edema, and erythema to right lower leg.  Dressing procedure/placement/frequency:Cleanse bilateral lower legs with soap and water and pat dry.  Apply zinc layer from below toes to below knees.  Secure with self adherent Coban layer. Change weekly.  Will not follow at this time.  Please re-consult if needed.  Domenic Moras RN BSN [place] Pager [phone]

## 2016-08-06 NOTE — Progress Notes (Signed)
Pt increased impulsive activity over night. Requiring pain medication frequently. High fall risk. Room near nurses station. Bed alarm on sensitive. Low bed and mats ordered for safety. Will continue to closely monitor.

## 2016-08-06 NOTE — Consult Note (Signed)
Consultation Note Date: 08/05/16  Patient Name: Donald Bowers  DOB: 01-25-1956  MRN: [id number]  Age / Sex: 60 y.o., male  PCP: Dion Body, MD Referring Physician: Vaughan Basta, *  Reason for Consultation: Establishing goals of care  HPI/Patient Profile: 61 y.o. male  with past medical history of hepatocellular carcinoma, hepatitis C, ETOH and cocaine abuse, GI bleed, COPD, pneumonia, and arthritis admitted on 08/03/2016 with shortness of breath. In ED, chest xray reveals bilateral pleural effusions. Placed on BiPAP and given lasix and pain medication. Patient was mildly hyponatremic with elevated lactic acid. Paracentesis on 6/3 with 2.6 L removed. Receiving antibiotics for sepsis likely SBP. Off BiPAPand on 4L nasal cannula. Palliative medicine consultation for goals of care.    Clinical Assessment and Goals of Care: I have reviewed medical records, discussed with care team, and met with patient and sister Donald Bowers) at bedside to discuss diagnosis, prognosis, GOC, EOL wishes, disposition and options. Patient known to PMT from previous admission. Note reviewed.   Again introduced Palliative Medicine as specialized medical care for people living with serious illness. It focuses on providing relief from the symptoms and stress of a serious illness.  Patient lives at home with son, Donald Bowers. Sisters are supportive but also live 45 minutes away from him and work. He has continued to have fluid build-up and shortness of breath. He has a poor appetite. Followed by [facility].   Discussed diagnoses, interventions, and guarded prognosis. Sister has a good understanding that his cancer is terminal and he is not a candidate for chemotherapy.    Advanced directives and concepts specific to code status were discussed. No documented HCPOA. He initially says he would want his son but then changes to wanting  sister, Donald Bowers to be POA. Asked [name] about his code status--if his heart were to stop and he died, would he want resuscitation and life support? Patient tells me he would "want to be on a ventilator for 30 days and if they can't find a way to get the fluid off by then, I just want me and the person pulling the plug in the room."  Educated on my recommendation for DNR/DNI with underlying cancer and this being more harm than good to him at the end of his life. Unfortunately not changing the outcome or underlying cancer if he did survive resuscitation. Also, that we know how to "get the fluid off" with paracentesis and lasix but that fluid will continue to accumulate due to cancer. Explained my concern with his high risk for decompensation requiring ventilation and consider wishes at EOL--comfortable with hospice services surrounded by family OR hospitalized in ICU on ventilator.    Briefly educated on hospice services. Donald Bowers has a good understanding of need for DNR/DNI and hospice services. "It is hard to accept though." She does not want to see him suffer or in pain. She speaks of the family not wanting to make decisions for him while he is still awake and communicating.   The patient is struggling to  accept he may be approaching the end of his life with terminal cancer.   Answered questions and concerns. Provided emotional support.     SUMMARY OF RECOMMENDATIONS    FULL code/FULL scope treatment. Continue current interventions.   Family meeting tomorrow 6/6 at 12pm to further discuss [place], code status, and hospice services.  Code Status/Advance Care Planning:  Full code-educated on recommendation for DNR/DNI with underlying cancer.   Symptom Management:   Per attending  Palliative Prophylaxis:   Aspiration, Delirium Protocol, Frequent Pain Assessment and Oral Care  Additional Recommendations (Limitations, Scope, Preferences):  Full Scope Treatment  Psycho-social/Spiritual:    Desire for further Chaplaincy support:yes  Additional Recommendations: Caregiving  Support/Resources and Education on Hospice  Prognosis:   Unable to determine  Discharge Planning: To Be Determined      Primary Diagnoses: Present on Admission: . Sepsis ([place])   I have reviewed the medical record, interviewed the patient and family, and examined the patient. The following aspects are pertinent.  Past Medical History:  Diagnosis Date  . Arthritis   . Cancer ([place])    liver  . Cocaine abuse   . COPD (chronic obstructive pulmonary disease) ([place])   . ETOH abuse   . GI bleed   . Hepatitis C   . Pneumonia    Social History   Social History  . Marital status: Divorced    Spouse name: N/A  . Number of children: N/A  . Years of education: N/A   Social History Main Topics  . Smoking status: Former Smoker    Packs/day: 0.50    Types: Cigarettes  . Smokeless tobacco: Never Used     Comment: pt said he has all the information he needs   . Alcohol use No     Comment: weekly/daily, pt report drinking 2 can in 2weeks  . Drug use: Yes    Types: "Crack" cocaine, Cocaine  . Sexual activity: Not Asked   Other Topics Concern  . None   Social History Narrative  . None   Family History  Problem Relation Age of Onset  . COPD Mother   . Diabetes Sister   . Breast cancer Sister   . Cancer Father        back cancer  . Breast cancer Maternal Aunt   . Breast cancer Paternal Uncle    Scheduled Meds: . docusate sodium  100 mg Oral BID  . feeding supplement (ENSURE ENLIVE)  237 mL Oral TID BM  . folic acid  1 mg Oral Daily  . furosemide  40 mg Oral Daily  . lactulose  10 g Oral BID  . mouth rinse  15 mL Mouth Rinse BID  . multivitamin with minerals  1 tablet Oral Daily  . pantoprazole  40 mg Oral Daily  . spironolactone  25 mg Oral Daily  . sucralfate  1 g Oral TID WC & HS   Continuous Infusions: . cefTRIAXone (ROCEPHIN) 2 g IVPB Stopped (08/06/16 0841)   PRN  Meds:.acetaminophen **OR** acetaminophen, guaiFENesin-dextromethorphan, morphine injection, ondansetron **OR** ondansetron (ZOFRAN) IV, oxyCODONE, traMADol Medications Prior to Admission:  Prior to Admission medications   Medication Sig Start Date End Date Taking? Authorizing Provider  ciprofloxacin (CIPRO) 500 MG tablet Take 1 tab daily Patient taking differently: Take 500 mg by mouth daily with breakfast.  07/21/16  Yes Fritzi Mandes, MD  feeding supplement, ENSURE ENLIVE, (ENSURE ENLIVE) LIQD Take 237 mLs by mouth 3 (three) times daily between meals. 07/18/16  Yes Fritzi Mandes,  MD  folic acid (FOLVITE) 1 MG tablet Take 1 tablet (1 mg total) by mouth daily. 06/24/16  Yes Fritzi Mandes, MD  furosemide (LASIX) 40 MG tablet Take 1 tablet (40 mg total) by mouth daily. 07/21/16  Yes Fritzi Mandes, MD  Multiple Vitamin (MULTIVITAMIN WITH MINERALS) TABS tablet Take 1 tablet by mouth daily. 06/24/16  Yes Fritzi Mandes, MD  oxyCODONE (OXY IR/ROXICODONE) 5 MG immediate release tablet Take 1 tablet (5 mg total) by mouth 2 (two) times daily as needed for severe pain. 07/29/16  Yes Lloyd Huger, MD  pantoprazole (PROTONIX) 40 MG tablet Take 1 tablet (40 mg total) by mouth daily. 07/29/16  Yes Lloyd Huger, MD  spironolactone (ALDACTONE) 25 MG tablet Take 1 tablet (25 mg total) by mouth daily. 07/19/16  Yes Fritzi Mandes, MD  sucralfate (CARAFATE) 1 GM/10ML suspension Take 10 mLs (1 g total) by mouth 4 (four) times daily -  with meals and at bedtime. 06/24/16  Yes Fritzi Mandes, MD  traMADol (ULTRAM) 50 MG tablet Take 1 tablet (50 mg total) by mouth every 6 (six) hours as needed for moderate pain. 07/09/16  Yes Lloyd Huger, MD   No Known Allergies Review of Systems  Constitutional: Positive for appetite change and fatigue.  Respiratory: Positive for shortness of breath.    Physical Exam  Constitutional: He is easily aroused. He appears ill.  HENT:  Head: Normocephalic and atraumatic.  Cardiovascular:  Regular rhythm.   Pulmonary/Chest: Effort normal. No accessory muscle usage. No tachypnea. No respiratory distress. He has decreased breath sounds.  Abdominal: He exhibits distension and ascites. Bowel sounds are decreased. There is no tenderness.  Musculoskeletal: He exhibits edema (anasarca).  Neurological: He is alert and easily aroused.  Drowsy, pleasantly confused  Skin: Skin is warm and dry. There is pallor.  Psychiatric: His speech is delayed. He is slowed.  Nursing note and vitals reviewed.  Vital Signs: BP (!) 115/59 (BP Location: Left Arm)   Pulse 98   Temp 97.8 F (36.6 C) (Oral)   Resp 18   Ht 6' (1.829 m)   Wt 97.2 kg (214 lb 4.8 oz)   SpO2 99%   BMI 29.06 kg/m  Pain Assessment: 0-10   Pain Score: Asleep  SpO2: SpO2: 99 % O2 Device:SpO2: 99 % O2 Flow Rate: .O2 Flow Rate (L/min): 4 L/min  IO: Intake/output summary:   Intake/Output Summary (Last 24 hours) at 08/06/16 1314 Last data filed at 08/06/16 0800  Gross per 24 hour  Intake              460 ml  Output              350 ml  Net              110 ml    LBM: Last BM Date: 08/01/16 Baseline Weight: Weight: 93.9 kg (207 lb) Most recent weight: Weight: 97.2 kg (214 lb 4.8 oz)     Palliative Assessment/Data: PPS 50%   Flowsheet Rows     Most Recent Value  Intake Tab  Referral Department  Hospitalist  Unit at Time of Referral  Oncology Unit  Palliative Care Primary Diagnosis  Cancer  Date Notified  08/05/16  Palliative Care Type  Return patient Palliative Care  Reason for referral  Clarify Goals of Care  Date of Admission  08/03/16  Date first seen by Palliative Care  08/05/16  # of days IP prior to Palliative referral  2  Clinical Assessment  Palliative Performance Scale Score  50%  Psychosocial & Spiritual Assessment  Palliative Care Outcomes  Patient/Family meeting held?  Yes  Who was at the meeting?  patient and sister  Palliative Care Outcomes  Clarified goals of care, Counseled regarding  hospice, ACP counseling assistance, Provided end of life care assistance, Provided psychosocial or spiritual support      Time In: 1300 Time Out: 1415 Time Total: 61mn Greater than 50%  of this time was spent counseling and coordinating care related to the above assessment and plan.  Signed by:  MIhor Dow FNP-C Palliative Medicine Team  Phone: [phone]: [phone]  Please contact Palliative Medicine Team phone at [phone] questions and concerns.  For individual provider: See AShea Evans

## 2016-08-06 NOTE — Progress Notes (Signed)
Requested to meet with patient's familyby  Ihor Dow NP following a lengthy Palliative Medicine consult to answer questions and provide information regarding a transfer to the hospice home. Patient is a current [facility] patient and known to this Probation officer. He has continued to decline and appears weaker and more disoriented at visit today. Writer met in the room with patient's son Castulo and sisters Bertram Millard and Craig. Education was initiated regarding hospice services, philosophy and team approach to care with good understanding voiced. Patient was intermittently awake but did not participate in the conversation. Hospice information and contact number given to [facility]. Plan is for patient to transfer to the hospice home tomorrow.  Hospital care team all aware.Thank you. Will continue to follow through final disposition. Flo Shanks RN, BSN, [facility] [place], hospital Liaison [phone] c

## 2016-08-07 DIAGNOSIS — K117 Disturbances of salivary secretion: Secondary | ICD-10-CM

## 2016-08-07 DIAGNOSIS — Z515 Encounter for palliative care: Secondary | ICD-10-CM

## 2016-08-07 LAB — BODY FLUID CULTURE: Culture: NO GROWTH

## 2016-08-07 MED ORDER — ORAL CARE MOUTH RINSE: 15.0000 mL | Freq: Two times a day (BID) | OROMUCOSAL | 0 refills | Status: AC

## 2016-08-07 MED ORDER — MORPHINE SULFATE (CONCENTRATE) 10 MG/0.5ML PO SOLN: 10.0000 mg | ORAL | 0 refills | Status: AC | PRN

## 2016-08-07 MED ORDER — MORPHINE SULFATE (PF) 2 MG/ML IV SOLN: 2.0000 mg | INTRAVENOUS | Status: DC | PRN

## 2016-08-07 MED ORDER — LORAZEPAM 0.5 MG PO TABS: 0.5000 mg | ORAL_TABLET | Freq: Three times a day (TID) | ORAL | 0 refills | Status: AC | PRN

## 2016-08-07 MED ORDER — GLYCOPYRROLATE 0.2 MG/ML IJ SOLN
0.1000 mg | Freq: Four times a day (QID) | INTRAMUSCULAR | Status: DC | PRN
  Administered 2016-08-07: 06:00:00 0.1 mg via INTRAVENOUS
  Filled 2016-08-07: qty 1

## 2016-08-07 MED ORDER — GLYCOPYRROLATE 0.2 MG/ML IJ SOLN
0.4000 mg | INTRAMUSCULAR | Status: DC | PRN
  Administered 2016-08-07: 11:00:00 0.4 mg via INTRAVENOUS
  Filled 2016-08-07: qty 2

## 2016-08-07 MED ORDER — MORPHINE SULFATE (CONCENTRATE) 10 MG/0.5ML PO SOLN: 10.0000 mg | ORAL | Status: DC | PRN

## 2016-08-07 NOTE — Progress Notes (Signed)
Pt having more difficulty controlling secretions.  Robitussin was ineffective.  Pt continues to have a dry cough and increased noisy breathing.  Spoke with Dr Estanislado Pandy and received order for Robinul .  Dorna Bloom RN

## 2016-08-07 NOTE — Progress Notes (Signed)
Pt with increased cough, shortness of breath, confusion and agitation.  Helped pt void- extreme edema to penis and scrotum.  Changed paracentesis site pouch as it was leaking.  Moderated drainage from site. 150 cc this shift.  Pt given Robitussin, Ativan and Morphine to assist with symptoms.  Low bed with bed alarm activated and floor mats in use.  Dorna Bloom RN

## 2016-08-07 NOTE — Discharge Summary (Signed)
[facility] NAME: Donald Bowers    MR#:  [id number]  DATE OF BIRTH:  [birth year]  DATE OF ADMISSION:  08/03/2016 ADMITTING PHYSICIAN: Harrie Foreman, MD  DATE OF DISCHARGE: 08/07/2016  PRIMARY CARE PHYSICIAN: Dion Body, MD    ADMISSION DIAGNOSIS:  Peripheral edema [R60.9] Dyspnea [R06.00] Dyspnea, unspecified type [R06.00] Community acquired pneumonia of right lower lobe of lung ([place]) [J18.1]  DISCHARGE DIAGNOSIS:  Active Problems:   Sepsis ([place])   DNR (do not resuscitate)   Increased oropharyngeal secretions   Terminal care   SECONDARY DIAGNOSIS:   Past Medical History:  Diagnosis Date  . Arthritis   . Cancer ([place])    liver  . Cocaine abuse   . COPD (chronic obstructive pulmonary disease) ([place])   . ETOH abuse   . GI bleed   . Hepatitis C   . Pneumonia     HOSPITAL COURSE:   1. Sepsis: The patient's criteria via tachycardia and tachypnea.   The patient has received broad-spectrum antibiotics in the emergency department.   The patient has been on Cipro prophylactically.  Rocephin 2 g Iv daily  Blood cultures are negative.  fluid from paracentesis with no growth.  Wound culture with few yeast and gram-positive cocci   Family agreed on hospice care now, so transfer to hospice home today.  2. Ascites: Secondary to cirrhosis Status post ultrasound-guided paracentesis on June 3 and had 2.6 L of fluid extraction -Patient is still having leakage of ascitic fluid from the 6/3 paracentesis site,  Tried to tap again on 08/05/16- had minimal fluids.  Continue Lasix and spironolactone  Checked ammonia level  3. Respiratory alkalosis: Chronic;secondary to liver disease. Workof breathing has decreased. Patient is lethargic.   4. Failure to thrive Patient is currently full code with home hospice palliative care is consulted regarding goals of care   After discussing with multiple family members,  they agreed on DNR and thinking about hospice,likely tomorrow.  4. DVT prophylaxis: SCDs  5. GI prophylaxis: Pantoprazole per home regimen  D/c to hospice home today.  DISCHARGE CONDITIONS:   Stable.  CONSULTS OBTAINED:  Treatment Team:  [name], MD  DRUG ALLERGIES:  No Known Allergies  DISCHARGE MEDICATIONS:   Current Discharge Medication List    START taking these medications   Details  LORazepam (ATIVAN) 0.5 MG tablet Take 1 tablet (0.5 mg total) by mouth every 8 (eight) hours as needed for anxiety. Qty: 30 tablet, Refills: 0    Morphine Sulfate (MORPHINE CONCENTRATE) 10 MG/0.5ML SOLN concentrated solution Place 0.5 mLs (10 mg total) under the tongue every hour as needed for moderate pain, severe pain or shortness of breath (air hunger). Qty: 180 mL, Refills: 0    mouth rinse LIQD solution 15 mLs by Mouth Rinse route 2 (two) times daily. Qty: 354 mL, Refills: 0      STOP taking these medications     ciprofloxacin (CIPRO) 500 MG tablet      feeding supplement, ENSURE ENLIVE, (ENSURE ENLIVE) LIQD      folic acid (FOLVITE) 1 MG tablet      furosemide (LASIX) 40 MG tablet      Multiple Vitamin (MULTIVITAMIN WITH MINERALS) TABS tablet      oxyCODONE (OXY IR/ROXICODONE) 5 MG immediate release tablet      pantoprazole (PROTONIX) 40 MG tablet      spironolactone (ALDACTONE) 25 MG tablet      sucralfate (CARAFATE) 1 GM/10ML  suspension      traMADol (ULTRAM) 50 MG tablet          DISCHARGE INSTRUCTIONS:    D/c to hospice home.  If you experience worsening of your admission symptoms, develop shortness of breath, life threatening emergency, suicidal or homicidal thoughts you must seek medical attention immediately by calling 911 or calling your MD immediately  if symptoms less severe.  You Must read complete instructions/literature along with all the possible adverse reactions/side effects for all the Medicines you take and that have been prescribed  to you. Take any new Medicines after you have completely understood and accept all the possible adverse reactions/side effects.   Please note  You were cared for by a hospitalist during your hospital stay. If you have any questions about your discharge medications or the care you received while you were in the hospital after you are discharged, you can call the unit and asked to speak with the hospitalist on call if the hospitalist that took care of you is not available. Once you are discharged, your primary care physician will handle any further medical issues. Please note that NO REFILLS for any discharge medications will be authorized once you are discharged, as it is imperative that you return to your primary care physician (or establish a relationship with a primary care physician if you do not have one) for your aftercare needs so that they can reassess your need for medications and monitor your lab values.    Today   CHIEF COMPLAINT:   Chief Complaint  Patient presents with  . Shortness of Breath  . Abdominal Pain  . Leg Swelling    HISTORY OF PRESENT ILLNESS:  Donald Bowers  is a 61 y.o. male with a known history of cirrhosis of the liver and metastatic hepatocellular carcinoma in addition to COPD presents to the emergency department complaining of shortness of breath. The patient also states that he "hurts all over". Chest x-ray in the emergency department showed a right greater than left small size pleural effusions. Due to the patient's increased work of breathing he was initially placed on BiPAP. ABG showed respiratory alkalosis. The patient was given pain medicine and discontinued from BiPAP. Lasix 40 mg IV given. Supplemental oxygen provided by nasal cannula. Laboratory evaluation revealed mild hyponatremia in addition to elevated lactic acid. The patient was given 2 L of normal saline prior to the hospitalist service being asked to admit for further management.   VITAL  SIGNS:  Blood pressure 123/68, pulse (!) 108, temperature 97.4 F (36.3 C), temperature source Oral, resp. rate 20, height 6' (1.829 m), weight 92.1 kg (203 lb), SpO2 98 %.  I/O:   Intake/Output Summary (Last 24 hours) at 08/07/16 1101 Last data filed at 08/07/16 0500  Gross per 24 hour  Intake              100 ml  Output             1300 ml  Net            -1200 ml    PHYSICAL EXAMINATION:   GENERAL:  61 y.o.-year-old patient lying in the bed with no acute distress. Jaundiced EYES: Pupils equal, round, reactive to light and accommodation. Positive scleral icterus. Extraocular muscles intact.  HEENT: Head atraumatic, normocephalic. Oropharynx and nasopharynx clear.  NECK:  Supple, no jugular venous distention. No thyroid enlargement, no tenderness.  LUNGS: Diminished lower lung breath sounds bilaterally, no wheezing, rales,rhonchi or crepitation. No use  of accessory muscles of respiration.  CARDIOVASCULAR: S1, S2 tachycardic. No murmurs, rubs, or gallops.  ABDOMEN: Soft, Less distended. Status post paracentesis 08/03/2016 ,Acetic fluid is leaking from the paracentesis site Bowel sounds present.  EXTREMITIES: + pedal edema, NO cyanosis, or clubbing.  NEUROLOGIC: Patient is less alert today and answers some questions  PSYCHIATRIC: The patient is less alert today, lethargic SKIN: No obvious rash, lesion, or ulcer.   DATA REVIEW:   CBC  Recent Labs Lab 08/06/16 0352  WBC 7.1  HGB 7.8*  HCT 24.6*  PLT 173    Chemistries   Recent Labs Lab 08/06/16 0352  NA 132*  K 4.6  CL 102  CO2 25  GLUCOSE 104*  BUN 25*  CREATININE 0.78  CALCIUM 8.7*  AST 47*  ALT 15*  ALKPHOS 85  BILITOT 4.8*    Cardiac Enzymes  Recent Labs Lab 08/03/16 0324  TROPONINI <0.03    Microbiology Results  Results for orders placed or performed during the hospital encounter of 08/03/16  Culture, blood (routine x 2)     Status: None (Preliminary result)   Collection Time: 08/03/16  3:24 AM   Result Value Ref Range Status   Specimen Description BLOOD RIGHT FOREARM  Final   Special Requests   Final    BOTTLES DRAWN AEROBIC AND ANAEROBIC Blood Culture results may not be optimal due to an excessive volume of blood received in culture bottles   Culture NO GROWTH 4 DAYS  Final   Report Status PENDING  Incomplete  Culture, blood (routine x 2)     Status: None (Preliminary result)   Collection Time: 08/03/16  3:24 AM  Result Value Ref Range Status   Specimen Description BLOOD LEFT ANTECUBITAL  Final   Special Requests   Final    BOTTLES DRAWN AEROBIC AND ANAEROBIC Blood Culture adequate volume   Culture NO GROWTH 4 DAYS  Final   Report Status PENDING  Incomplete  Body fluid culture     Status: None   Collection Time: 08/03/16  2:21 PM  Result Value Ref Range Status   Specimen Description PERITONEAL  Final   Special Requests NONE  Final   Gram Stain   Final    ABUNDANT WBC PRESENT, PREDOMINANTLY MONONUCLEAR NO ORGANISMS SEEN    Culture   Final    NO GROWTH 3 DAYS Performed at [facility], [address] [place], [place] [address]    Report Status 08/07/2016 FINAL  Final  Aerobic Culture (superficial specimen)     Status: None (Preliminary result)   Collection Time: 08/04/16  5:47 PM  Result Value Ref Range Status   Specimen Description WOUND  Final   Special Requests RL  Final   Gram Stain   Final    NO WBC SEEN FEW YEAST FEW GRAM POSITIVE COCCI IN PAIRS RARE GRAM VARIABLE ROD    Culture   Final    ABUNDANT CANDIDA ALBICANS CULTURE REINCUBATED FOR BETTER GROWTH Performed at [facility], [place] [address], [place], [place] [address]    Report Status PENDING  Incomplete    RADIOLOGY:  US Abdomen Limited  Result Date: 08/05/2016 CLINICAL DATA:  Patient with history of recurrent ascites. Request is made for therapeutic paracentesis. EXAM: LIMITED ABDOMEN ULTRASOUND FOR ASCITES TECHNIQUE: Limited ultrasound survey for ascites was performed in all four  abdominal quadrants. COMPARISON:  None. FINDINGS: Patient with minimal amount of fluid visualized. IMPRESSION: Patient with minimal amount of fluid not safely amenable to  paracentesis. Read by:  Brynda Greathouse PA-C Electronically Signed   By: Inez Catalina M.D.   On: 08/05/2016 12:11    EKG:   Orders placed or performed during the hospital encounter of 08/03/16  . EKG 12-Lead  . EKG 12-Lead      Management plans discussed with the patient, family and they are in agreement.  CODE STATUS:     Code Status Orders        Start     Ordered   08/06/16 1660  Do not attempt resuscitation (DNR)  Continuous    Question Answer Comment  In the event of cardiac or respiratory ARREST Do not call a "code blue"   In the event of cardiac or respiratory ARREST Do not perform Intubation, CPR, defibrillation or ACLS   In the event of cardiac or respiratory ARREST Use medication by any route, position, wound care, and other measures to relive pain and suffering. May use oxygen, suction and manual treatment of airway obstruction as needed for comfort.      08/06/16 1519    Code Status History    Date Active Date Inactive Code Status Order ID Comments User Context   08/03/2016  7:32 AM 08/06/2016  3:19 PM Full Code [id number]  Harrie Foreman, MD Inpatient   07/14/2016 11:13 AM 07/21/2016  2:51 PM Full Code [id number]  Hillary Bow, MD ED   06/20/2016  2:55 AM 06/24/2016  3:03 PM Full Code [id number]  Lance Coon, MD Inpatient   05/19/2016  9:16 PM 05/22/2016  3:03 PM Full Code [id number]  Fritzi Mandes, MD Inpatient      TOTAL TIME TAKING CARE OF THIS PATIENT: 35 minutes.    Vaughan Basta M.D on 08/07/2016 at 11:01 AM  Between 7am to 6pm - Pager - [phone]  After 6pm go to [email or web address] - password [email or web address] [facility]  Office  [phone]  CC: Primary care physician; Dion Body, MD   Note: This dictation was prepared with Dragon dictation along with smaller  phrase technology. Any transcriptional errors that result from this process are unintentional.

## 2016-08-07 NOTE — Progress Notes (Signed)
Follow up visit made. Patient seen lying in bed, audible secretions noted. Sister Jacqlyn Larsen at bedside. Patient appears to have further declined since visit yesterday afternoon. Plan continues for him to be transferred to the Hospice home today for management of pain, dyspnea and secretions. He has required 2 doses of IV morphine since midnight as well as Robinul for secretions and IV lorazepam for anxiety. Patient had no verbal response to voice, he appeared pale and jaundice. Emotional support provided to [facility]. Report called to the hospice home, EMS notified for transport. Hospital updated. Thank you. Flo Shanks RN, BSN, [facility] [place], hospital Liaison [phone]

## 2016-08-07 NOTE — Progress Notes (Signed)
Daily Progress Note   Patient Name: Donald Bowers       Date: 08/07/2016 DOB: May 01, 1955  Age: 61 y.o. MRN#: [id number] Attending Physician: Vaughan Basta, * Primary Care Physician: Dion Body, MD Admit Date: 08/03/2016  Reason for Consultation/Follow-up: Establishing goals of care  Subjective/Goals of Care: Patient lethargic not following commands. Audible secretions.   Sister and niece at bedside. Educated on EOL expectations and focus on symptom management. Sister and son going to hospice home at 10am to sign papers. Family understands the patient is at the end-of-life and time is short. Provided emotional and spiritual support.        Length of Stay: 4  Current Medications: Scheduled Meds:  . mouth rinse  15 mL Mouth Rinse BID    Continuous Infusions:   PRN Meds: acetaminophen **OR** acetaminophen, glycopyrrolate, guaiFENesin-dextromethorphan, haloperidol lactate, LORazepam, morphine injection, morphine CONCENTRATE, ondansetron **OR** ondansetron (ZOFRAN) IV  Physical Exam  Constitutional: He appears lethargic. He appears ill.  HENT:  Head: Normocephalic and atraumatic.  Eyes: Scleral icterus is present.  Cardiovascular: Regular rhythm.   Pulmonary/Chest: Accessory muscle usage present. No tachypnea. No respiratory distress. He has rhonchi.  Audible secretions-RN to give robinul  Abdominal: He exhibits distension and ascites. There is no tenderness.  Genitourinary: Right testis shows swelling. Left testis shows swelling.  Musculoskeletal: Edema: generalized/anasarca.  Neurological: He appears lethargic.  Skin: Skin is warm and dry. There is pallor.  Psychiatric: Cognition and memory are impaired. He is inattentive.  Nursing note and vitals reviewed.         Vital Signs: BP 123/68 (BP Location: Left Arm)   Pulse (!) 108   Temp 97.4 F (36.3 C) (Oral)   Resp 20   Ht 6' (1.829 m)   Wt 92.1 kg (203 lb)   SpO2 98%   BMI 27.53 kg/m  SpO2: SpO2: 98 % O2 Device: O2 Device: Nasal Cannula O2 Flow Rate: O2 Flow Rate (L/min): 4 L/min  Intake/output summary:   Intake/Output Summary (Last 24 hours) at 08/07/16 0929 Last data filed at 08/07/16 0500  Gross per 24 hour  Intake              100 ml  Output             1300 ml  Net            -  1200 ml   LBM: Last BM Date: 08/05/16 Baseline Weight: Weight: 93.9 kg (207 lb) Most recent weight: Weight: 92.1 kg (203 lb)    Palliative Assessment/Data: PPS 20%   Flowsheet Rows     Most Recent Value  Intake Tab  Referral Department  Hospitalist  Unit at Time of Referral  Oncology Unit  Palliative Care Primary Diagnosis  Cancer  Date Notified  08/05/16  Palliative Care Type  Return patient Palliative Care  Reason for referral  Clarify Goals of Care  Date of Admission  08/03/16  Date first seen by Palliative Care  08/05/16  # of days IP prior to Palliative referral  2  Clinical Assessment  Palliative Performance Scale Score  20%  Psychosocial & Spiritual Assessment  Palliative Care Outcomes  Patient/Family meeting held?  Yes  Who was at the meeting?  sister and niece  [place] regarding hospice, Changed to focus on comfort, Provided psychosocial or spiritual support, Transitioned to hospice, Completed durable DNR, Changed CPR status, Improved pain interventions, Improved non-pain symptom therapy, Provided end of life care assistance      Patient Active Problem List   Diagnosis Date Noted  . DNR (do not resuscitate)   . Sepsis ([place]) 08/03/2016  . Alcoholic cirrhosis of liver with ascites ([place])   . Hepatocellular carcinoma ([place])   . Palliative care by specialist   . Goals of care, counseling/discussion   . SBP (spontaneous bacterial peritonitis) ([place])     . Ascitic fluid 07/14/2016  . Liver mass   . Hematemesis 06/19/2016  . Melena 06/19/2016  . COPD (chronic obstructive pulmonary disease) ([place]) 06/19/2016  . Hepatitis C 06/19/2016  . Acute hyponatremia 05/19/2016    Palliative Care Assessment & Plan   Patient Profile: 61 y.o. male  with past medical history of hepatocellular carcinoma, hepatitis C, ETOH and cocaine abuse, GI bleed, COPD, pneumonia, and arthritis admitted on 08/03/2016 with shortness of breath. In ED, chest xray reveals bilateral pleural effusions. Placed on BiPAP and given lasix and pain medication. Patient was mildly hyponatremic with elevated lactic acid. Paracentesis on 6/3 with 2.6 L removed. Receiving antibiotics for sepsis likely SBP. Off BiPAPand on 4L nasal cannula. Palliative medicine consultation for goals of care.   Assessment: Metastatic hepatocellular carcinoma Sepsis likely due to SBP Ascites Cirrhosis Respiratory alkalosis Failure to thrive  Recommendations/Plan:  DNR/DNI  Comfort measures only. Discontinued labs/interventions/medications not aimed at comfort.  Symptom management:  Morphine 2mg  IV q1h prn pain/dyspnea/air hunger  Roxanol 10mg  SL q1h prn pain/dyspnea/air hunger  Ativan 1mg  IV q4h prn anxiety  Haldol 2mg  IV q6h prn agitation  Robinul 0.4mg  q4h prn secretions  Transfer to hospice home today.   Code Status: DNR/DNI   Code Status Orders        Start     Ordered   08/03/16 0733  Full code  Continuous     08/03/16 0732    Code Status History    Date Active Date Inactive Code Status Order ID Comments User Context   07/14/2016 11:13 AM 07/21/2016  2:51 PM Full Code [id number]  Hillary Bow, MD ED   06/20/2016  2:55 AM 06/24/2016  3:03 PM Full Code [id number]  Lance Coon, MD Inpatient   05/19/2016  9:16 PM 05/22/2016  3:03 PM Full Code [id number]  Fritzi Mandes, MD Inpatient       Prognosis:   < 2 weeks: likely days with hepatocellular cancer, recurrent ascites, acute  respiratory failure, and high risk  for decompensation.  Discharge Planning:  Hospice facility  Care plan was discussed with patient, sister, niece, Dr. Anselm Jungling, hospice liaison.   Thank you for allowing the Palliative Medicine Team to assist in the care of this patient.   Time In/Out: 4599-7741   Total Time 10min Prolonged Time Billed no      Greater than 50%  of this time was spent counseling and coordinating care related to the above assessment and plan.  Ihor Dow, FNP-C Palliative Medicine Team  Phone: [phone] Fax: [phone]  Please contact Palliative Medicine Team phone at [phone] for questions and concerns.

## 2016-08-07 NOTE — Progress Notes (Signed)
Patient discharged to hospice home per MD order. EMS will transport.

## 2016-08-07 NOTE — Progress Notes (Signed)
Nutrition Brief Note  Chart reviewed. Patient now transitioning to comfort care and will discharge to hospice home.   Nutrition supplements have been discontinued. Can also consider liberalizing to regular diet if patient desires food. No further nutrition interventions warranted at this time. Please consult RD as needed.   Willey Blade, MS, RD, LDN Pager: [phone] After Hours Pager: [phone]

## 2016-08-07 NOTE — Clinical Social Work Note (Signed)
Pt discharged to [facility] via [facility]. Report has been called. CSW prepared discharge packet. Hospice Liaison made arrangements for transfer. CSW is signing off as no further needs identified.   Darden Dates, MSW, LCSW  Clinical Social Worker  [phone]

## 2016-08-08 LAB — AEROBIC CULTURE W GRAM STAIN (SUPERFICIAL SPECIMEN): Gram Stain: NONE SEEN

## 2016-08-08 LAB — CULTURE, BLOOD (ROUTINE X 2)
Culture: NO GROWTH
Culture: NO GROWTH
SPECIAL REQUESTS: ADEQUATE

## 2016-08-08 LAB — AEROBIC CULTURE  (SUPERFICIAL SPECIMEN)

## 2016-08-11 ENCOUNTER — Telehealth: Payer: Self-pay | Admitting: *Deleted

## 2016-08-11 NOTE — Telephone Encounter (Signed)
Hospice called to report that patient expired August 12, 2016 @ 1:32 AM

## 2016-08-31 DEATH — deceased

## 2018-09-21 IMAGING — MR MR ABDOMEN WO/W CM
7 of 17 series · 19 of 48 positions shown · IV contrast (8mL EOVIST)
Comparison: CT abdomen/pelvis dated 05/19/2016

CLINICAL DATA: Hepatitis C, nausea/ vomiting, abnormal liver on
prior CT

EXAM:
MRI ABDOMEN WITHOUT AND WITH CONTRAST
TECHNIQUE: Multiplanar multisequence MR imaging of the abdomen was performed
both before and after the administration of intravenous contrast.
CONTRAST:  8 mL Eovist IV

[Series 3: T2 · coronal · 8.0mm · 1.64mm/px · 2 of 33 slices shown (1 of 2)]
[im 1/33]
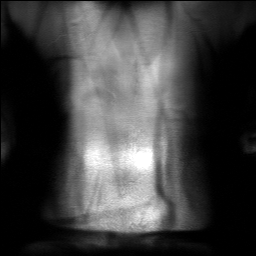
[im 33/33]
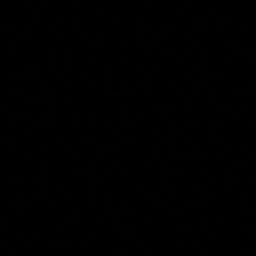

[Series 4: axial in-out of · axial · 7.0mm · 0.82mm/px · z∈[-122,+169]mm · 4 of 66 slices shown]
[im 1/66]
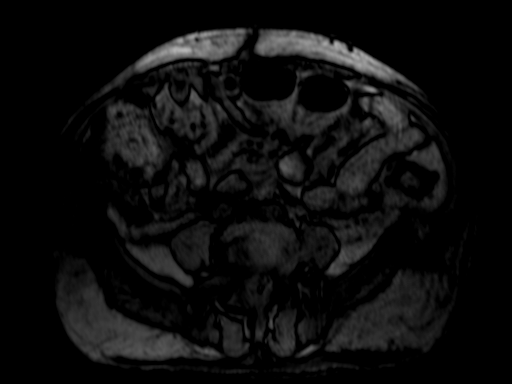
[im 22/66]
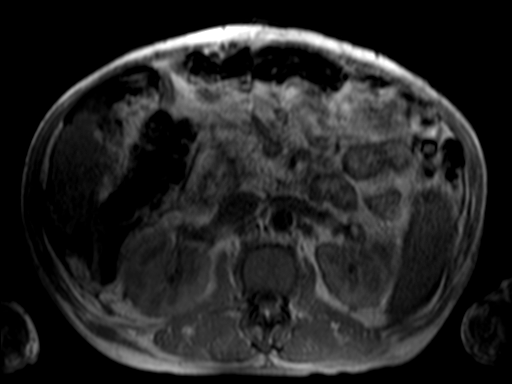
[im 44/66]
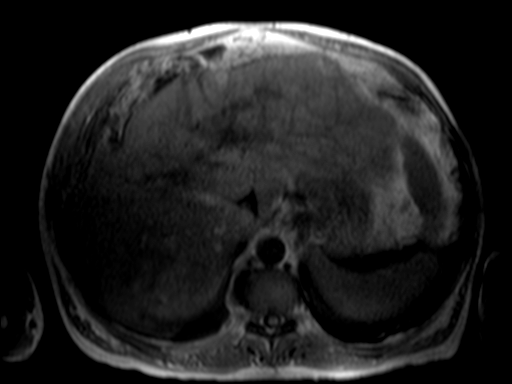
[im 66/66]
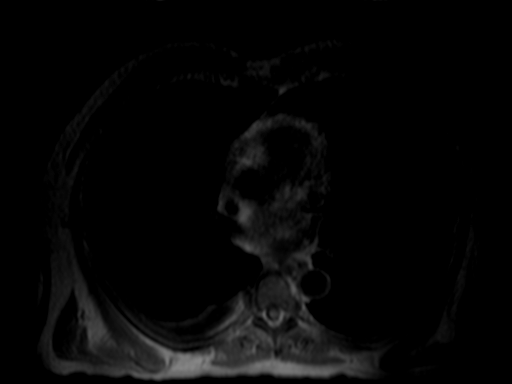

[Series 13: DWI · axial · 6.0mm · 3.28mm/px · z∈[-148,+205]mm · 6 of 141 slices shown]
[im 1/141]
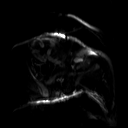
[im 29/141]
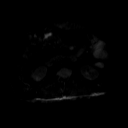
[im 57/141]
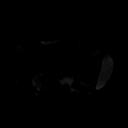
[im 85/141]
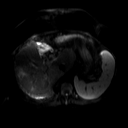
[im 113/141]
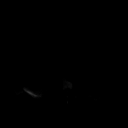
[im 141/141]
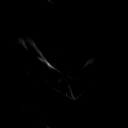

[Series 14: axial dwi_adc · axial · 6.0mm · 3.28mm/px · z∈[-148,+205]mm · 2 of 50 slices shown]
[im 1/50]
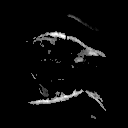
[im 50/50]
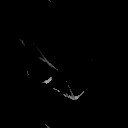

[Series 15: T2 fat-sat · axial · 7.0mm · 0.82mm/px · 1 of 33 slices shown]
[im 1/33]
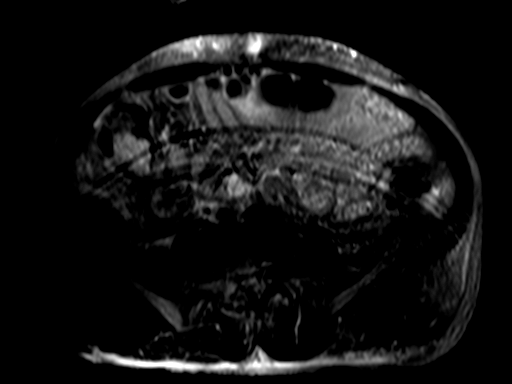

[Series 16: T2 · axial · 7.0mm · 1.64mm/px · 1 of 33 slices shown (2 of 2)]
[im 1/33]
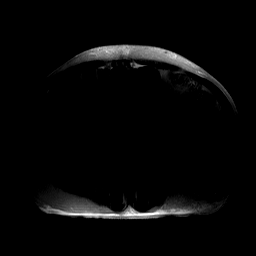

[Series 17: axial true fisp · axial · 4.0mm · 0.74mm/px · z∈[-105,+151]mm · 3 of 65 slices shown]
[im 1/65]
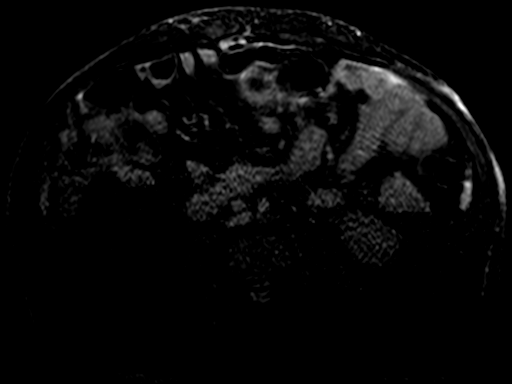
[im 33/65]
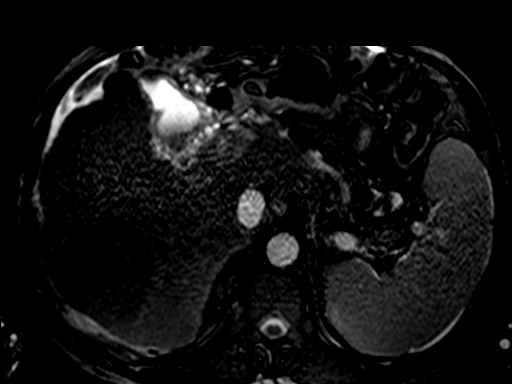
[im 65/65]
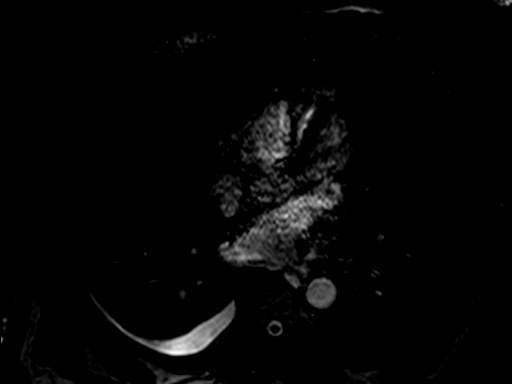

[19 of 48 positions shown; findings below may reference images not displayed]

FINDINGS: Motion degraded images.

Lower chest: Small right pleural effusion. Associated right lower
lobe atelectasis.

Hepatobiliary: Cirrhosis.

4.7 cm heterogeneously enhancing lesion in segment 4A (series
6/image 14), highly suspicious for HCC. Additional 13.2 cm
heterogeneously enhancing lesion centered in segment 7 (series 6/
image 32), also highly suspicious for HCC with associated right
portal vein thrombosis (series 10/ image 37).

Gallbladder is notable for layering sludge (series 15/image 18). No
associated inflammatory changes.

Pancreas:  Grossly unremarkable.

Spleen: Enlarged, measuring 16.0 cm in maximal craniocaudal
dimension.

Adrenals/Urinary Tract:  Adrenal glands are within normal limits.

Kidneys are grossly unremarkable.  No hydronephrosis.

Stomach/Bowel: Stomach and visualized bowel are grossly
unremarkable.

Vascular/Lymphatic: Thrombus extends into the main portal vein
(series 17/image 21) and to the portosplenic confluence (series 17/
image 39) and proximal SMV (series 17/image 48).

Gastroesophageal varices.  Recanalized periumbilical vein.

Small upper abdominal lymph nodes, likely reactive.

Other:  Small volume abdominal ascites.

Musculoskeletal: No focal osseous lesions.
IMPRESSION: Motion degraded images, limiting evaluation.

Two dominant lesions in segment 4A and segment 7, as described
above, suspicious for multifocal HCC. Associated portal vein
thrombosis, suspicious for tumor thrombus.

Cirrhosis with stigmata of portal hypertension, as above.
